# Patient Record
Sex: Female | Born: 1950
Health system: Southern US, Community
[De-identification: ages and names within clinical notes are randomized; demographics above are authoritative.]

## PROBLEM LIST (undated history)

## (undated) DIAGNOSIS — K219 Gastro-esophageal reflux disease without esophagitis: Secondary | ICD-10-CM

## (undated) DIAGNOSIS — S62102A Fracture of unspecified carpal bone, left wrist, initial encounter for closed fracture: Secondary | ICD-10-CM

## (undated) DIAGNOSIS — R351 Nocturia: Secondary | ICD-10-CM

## (undated) DIAGNOSIS — K589 Irritable bowel syndrome without diarrhea: Secondary | ICD-10-CM

## (undated) DIAGNOSIS — T7840XA Allergy, unspecified, initial encounter: Secondary | ICD-10-CM

## (undated) DIAGNOSIS — I10 Essential (primary) hypertension: Secondary | ICD-10-CM

## (undated) DIAGNOSIS — Z86010 Personal history of colonic polyps: Secondary | ICD-10-CM

## (undated) DIAGNOSIS — D369 Benign neoplasm, unspecified site: Secondary | ICD-10-CM

## (undated) HISTORY — PX: COLONOSCOPY: SHX174

## (undated) HISTORY — DX: Benign neoplasm, unspecified site: D36.9

## (undated) HISTORY — DX: Gastro-esophageal reflux disease without esophagitis: K21.9

## (undated) HISTORY — DX: Personal history of colonic polyps: Z86.010

## (undated) HISTORY — PX: ABDOMINAL HYSTERECTOMY: SHX81

## (undated) HISTORY — PX: TONSILLECTOMY: SUR1361

## (undated) HISTORY — PX: TUBAL LIGATION: SHX77

## (undated) HISTORY — DX: Allergy, unspecified, initial encounter: T78.40XA

## (undated) HISTORY — DX: Fracture of unspecified carpal bone, left wrist, initial encounter for closed fracture: S62.102A

---

## 1980-09-21 HISTORY — PX: CARPAL TUNNEL RELEASE: SHX101

## 1998-04-16 ENCOUNTER — Inpatient Hospital Stay (HOSPITAL_COMMUNITY): Admission: RE | Admit: 1998-04-16 | Discharge: 1998-04-21 | Payer: Self-pay | Admitting: Surgery

## 1998-08-19 ENCOUNTER — Emergency Department (HOSPITAL_COMMUNITY): Admission: EM | Admit: 1998-08-19 | Discharge: 1998-08-19 | Payer: Self-pay | Admitting: Emergency Medicine

## 1998-09-19 ENCOUNTER — Emergency Department (HOSPITAL_COMMUNITY): Admission: EM | Admit: 1998-09-19 | Discharge: 1998-09-19 | Payer: Self-pay | Admitting: Emergency Medicine

## 1998-12-25 ENCOUNTER — Other Ambulatory Visit: Admission: RE | Admit: 1998-12-25 | Discharge: 1998-12-25 | Payer: Self-pay | Admitting: Obstetrics and Gynecology

## 1999-12-27 ENCOUNTER — Emergency Department (HOSPITAL_COMMUNITY): Admission: EM | Admit: 1999-12-27 | Discharge: 1999-12-27 | Payer: Self-pay | Admitting: Emergency Medicine

## 1999-12-27 ENCOUNTER — Encounter: Payer: Self-pay | Admitting: Emergency Medicine

## 2000-04-01 ENCOUNTER — Encounter: Payer: Self-pay | Admitting: *Deleted

## 2000-04-01 ENCOUNTER — Encounter: Admission: RE | Admit: 2000-04-01 | Discharge: 2000-04-01 | Payer: Self-pay | Admitting: *Deleted

## 2001-03-08 ENCOUNTER — Encounter: Payer: Self-pay | Admitting: Obstetrics and Gynecology

## 2001-03-08 ENCOUNTER — Ambulatory Visit (HOSPITAL_COMMUNITY): Admission: RE | Admit: 2001-03-08 | Discharge: 2001-03-08 | Payer: Self-pay | Admitting: Obstetrics and Gynecology

## 2001-03-13 ENCOUNTER — Emergency Department (HOSPITAL_COMMUNITY): Admission: EM | Admit: 2001-03-13 | Discharge: 2001-03-13 | Payer: Self-pay | Admitting: Emergency Medicine

## 2001-03-13 ENCOUNTER — Encounter: Payer: Self-pay | Admitting: Emergency Medicine

## 2001-06-09 ENCOUNTER — Encounter: Payer: Self-pay | Admitting: Obstetrics and Gynecology

## 2001-06-09 ENCOUNTER — Encounter: Admission: RE | Admit: 2001-06-09 | Discharge: 2001-06-09 | Payer: Self-pay | Admitting: Obstetrics and Gynecology

## 2002-06-30 ENCOUNTER — Encounter: Admission: RE | Admit: 2002-06-30 | Discharge: 2002-06-30 | Payer: Self-pay | Admitting: Obstetrics and Gynecology

## 2002-06-30 ENCOUNTER — Encounter: Payer: Self-pay | Admitting: Obstetrics and Gynecology

## 2002-10-18 ENCOUNTER — Other Ambulatory Visit: Admission: RE | Admit: 2002-10-18 | Discharge: 2002-10-18 | Payer: Self-pay | Admitting: Obstetrics and Gynecology

## 2003-10-26 ENCOUNTER — Other Ambulatory Visit: Admission: RE | Admit: 2003-10-26 | Discharge: 2003-10-26 | Payer: Self-pay | Admitting: Obstetrics and Gynecology

## 2004-09-25 ENCOUNTER — Ambulatory Visit: Payer: Self-pay | Admitting: Internal Medicine

## 2004-10-02 ENCOUNTER — Ambulatory Visit: Payer: Self-pay | Admitting: Internal Medicine

## 2004-12-03 ENCOUNTER — Other Ambulatory Visit: Admission: RE | Admit: 2004-12-03 | Discharge: 2004-12-03 | Payer: Self-pay | Admitting: Obstetrics and Gynecology

## 2005-06-29 ENCOUNTER — Ambulatory Visit: Payer: Self-pay | Admitting: Internal Medicine

## 2005-11-23 ENCOUNTER — Ambulatory Visit: Payer: Self-pay | Admitting: Internal Medicine

## 2005-12-17 ENCOUNTER — Ambulatory Visit: Payer: Self-pay | Admitting: Internal Medicine

## 2007-05-06 ENCOUNTER — Encounter
Admission: RE | Admit: 2007-05-06 | Discharge: 2007-05-06 | Payer: Self-pay | Admitting: Physical Medicine and Rehabilitation

## 2007-07-06 ENCOUNTER — Encounter: Payer: Self-pay | Admitting: *Deleted

## 2007-07-06 DIAGNOSIS — M1990 Unspecified osteoarthritis, unspecified site: Secondary | ICD-10-CM | POA: Insufficient documentation

## 2007-07-06 DIAGNOSIS — I10 Essential (primary) hypertension: Secondary | ICD-10-CM | POA: Insufficient documentation

## 2007-07-06 DIAGNOSIS — K589 Irritable bowel syndrome without diarrhea: Secondary | ICD-10-CM | POA: Insufficient documentation

## 2007-07-06 DIAGNOSIS — Z9189 Other specified personal risk factors, not elsewhere classified: Secondary | ICD-10-CM | POA: Insufficient documentation

## 2007-07-06 DIAGNOSIS — M797 Fibromyalgia: Secondary | ICD-10-CM | POA: Insufficient documentation

## 2007-07-06 DIAGNOSIS — IMO0002 Reserved for concepts with insufficient information to code with codable children: Secondary | ICD-10-CM | POA: Insufficient documentation

## 2007-07-06 DIAGNOSIS — G44209 Tension-type headache, unspecified, not intractable: Secondary | ICD-10-CM | POA: Insufficient documentation

## 2007-08-02 ENCOUNTER — Ambulatory Visit: Payer: Self-pay | Admitting: Internal Medicine

## 2007-08-04 LAB — CONVERTED CEMR LAB
ALT: 18 units/L (ref 0–35)
AST: 17 units/L (ref 0–37)
Albumin: 4 g/dL (ref 3.5–5.2)
Alkaline Phosphatase: 75 units/L (ref 39–117)
BUN: 17 mg/dL (ref 6–23)
Basophils Absolute: 0.1 10*3/uL (ref 0.0–0.1)
Basophils Relative: 0.7 % (ref 0.0–1.0)
Bilirubin Urine: NEGATIVE
Bilirubin, Direct: 0.1 mg/dL (ref 0.0–0.3)
CO2: 29 meq/L (ref 19–32)
Calcium: 10 mg/dL (ref 8.4–10.5)
Chloride: 101 meq/L (ref 96–112)
Cholesterol: 274 mg/dL (ref 0–200)
Creatinine, Ser: 1.1 mg/dL (ref 0.4–1.2)
Direct LDL: 191.1 mg/dL
Eosinophils Absolute: 0.1 10*3/uL (ref 0.0–0.6)
Eosinophils Relative: 1.4 % (ref 0.0–5.0)
GFR calc Af Amer: 67 mL/min
GFR calc non Af Amer: 55 mL/min
Glucose, Bld: 105 mg/dL — ABNORMAL HIGH (ref 70–99)
HCT: 36.3 % (ref 36.0–46.0)
HDL: 63.4 mg/dL (ref >39.0)
Hemoglobin, Urine: NEGATIVE
Hemoglobin: 12.4 g/dL (ref 12.0–15.0)
Ketones, ur: NEGATIVE mg/dL
Leukocytes, UA: NEGATIVE
Lymphocytes Relative: 32.5 % (ref 12.0–46.0)
MCHC: 34.1 g/dL (ref 30.0–36.0)
MCV: 91.1 fL (ref 78.0–100.0)
Monocytes Absolute: 0.5 10*3/uL (ref 0.2–0.7)
Monocytes Relative: 5.5 % (ref 3.0–11.0)
Neutro Abs: 5.5 10*3/uL (ref 1.4–7.7)
Neutrophils Relative %: 59.9 % (ref 43.0–77.0)
Nitrite: NEGATIVE
Platelets: 307 10*3/uL (ref 150–400)
Potassium: 4.1 meq/L (ref 3.5–5.1)
RBC: 3.98 M/uL (ref 3.87–5.11)
RDW: 13 % (ref 11.5–14.6)
Sodium: 140 meq/L (ref 135–145)
Specific Gravity, Urine: >=1.03 (ref 1.000–1.03)
TSH: 1.45 microintl units/mL (ref 0.35–5.50)
Total Bilirubin: 0.6 mg/dL (ref 0.3–1.2)
Total CHOL/HDL Ratio: 4.3
Total Protein: 7.5 g/dL (ref 6.0–8.3)
Triglycerides: 171 mg/dL — ABNORMAL HIGH (ref 0–149)
Urine Glucose: NEGATIVE mg/dL
Urobilinogen, UA: 0.2 (ref 0.0–1.0)
VLDL: 34 mg/dL (ref 0–40)
WBC: 9.2 10*3/uL (ref 4.5–10.5)
pH: 5.5 (ref 5.0–8.0)

## 2007-08-10 ENCOUNTER — Ambulatory Visit: Payer: Self-pay | Admitting: Internal Medicine

## 2007-08-10 DIAGNOSIS — F329 Major depressive disorder, single episode, unspecified: Secondary | ICD-10-CM | POA: Insufficient documentation

## 2007-08-10 DIAGNOSIS — E785 Hyperlipidemia, unspecified: Secondary | ICD-10-CM | POA: Insufficient documentation

## 2007-09-05 ENCOUNTER — Ambulatory Visit: Payer: Self-pay | Admitting: Internal Medicine

## 2007-09-05 LAB — CONVERTED CEMR LAB
ALT: 37 units/L — ABNORMAL HIGH (ref 0–35)
AST: 23 units/L (ref 0–37)
Albumin: 4.1 g/dL (ref 3.5–5.2)
Cholesterol: 213 mg/dL (ref 0–200)
Direct LDL: 129.2 mg/dL
HDL: 60.6 mg/dL (ref >39.0)
Total Bilirubin: 0.5 mg/dL (ref 0.3–1.2)

## 2007-09-06 ENCOUNTER — Encounter: Payer: Self-pay | Admitting: Internal Medicine

## 2007-09-09 ENCOUNTER — Ambulatory Visit: Payer: Self-pay | Admitting: Internal Medicine

## 2007-11-15 ENCOUNTER — Telehealth: Payer: Self-pay | Admitting: Internal Medicine

## 2007-12-01 ENCOUNTER — Ambulatory Visit: Payer: Self-pay | Admitting: Internal Medicine

## 2007-12-22 ENCOUNTER — Telehealth: Payer: Self-pay | Admitting: Internal Medicine

## 2008-03-26 ENCOUNTER — Telehealth: Payer: Self-pay | Admitting: Internal Medicine

## 2008-07-02 ENCOUNTER — Telehealth: Payer: Self-pay | Admitting: Internal Medicine

## 2008-10-24 ENCOUNTER — Telehealth: Payer: Self-pay | Admitting: Internal Medicine

## 2008-10-25 ENCOUNTER — Telehealth: Payer: Self-pay | Admitting: Internal Medicine

## 2009-01-17 ENCOUNTER — Telehealth (INDEPENDENT_AMBULATORY_CARE_PROVIDER_SITE_OTHER): Payer: Self-pay | Admitting: *Deleted

## 2009-02-09 ENCOUNTER — Encounter: Payer: Self-pay | Admitting: Internal Medicine

## 2009-04-25 ENCOUNTER — Telehealth: Payer: Self-pay | Admitting: Internal Medicine

## 2009-06-05 ENCOUNTER — Ambulatory Visit: Payer: Self-pay | Admitting: Internal Medicine

## 2009-06-05 LAB — CONVERTED CEMR LAB
BUN: 19 mg/dL (ref 6–23)
CO2: 29 meq/L (ref 19–32)
Calcium: 9.7 mg/dL (ref 8.4–10.5)
Creatinine, Ser: 1.2 mg/dL (ref 0.4–1.2)

## 2009-08-23 ENCOUNTER — Telehealth: Payer: Self-pay | Admitting: Internal Medicine

## 2009-10-10 ENCOUNTER — Telehealth: Payer: Self-pay | Admitting: Internal Medicine

## 2010-07-28 ENCOUNTER — Telehealth: Payer: Self-pay | Admitting: Internal Medicine

## 2010-08-13 ENCOUNTER — Ambulatory Visit: Payer: Self-pay | Admitting: Internal Medicine

## 2010-08-13 LAB — CONVERTED CEMR LAB
ALT: 12 units/L (ref 0–35)
AST: 14 units/L (ref 0–37)
Alkaline Phosphatase: 99 units/L (ref 39–117)
BUN: 16 mg/dL (ref 6–23)
Bilirubin, Direct: 0.1 mg/dL (ref 0.0–0.3)
Calcium: 9.7 mg/dL (ref 8.4–10.5)
Cholesterol: 257 mg/dL — ABNORMAL HIGH (ref 0–200)
Creatinine, Ser: 0.98 mg/dL (ref 0.40–1.20)
Indirect Bilirubin: 0.2 mg/dL (ref 0.0–0.9)
Potassium: 4.7 meq/L (ref 3.5–5.3)
Total Protein: 7.5 g/dL (ref 6.0–8.3)
Triglycerides: 171 mg/dL — ABNORMAL HIGH (ref <150)
VLDL: 34 mg/dL (ref 0–40)

## 2010-10-21 NOTE — Progress Notes (Signed)
  Phone Note Refill Request Message from:  Pharmacy  Refills Requested: Medication #1:  TRAMADOL HCL 50 MG TABS 1 to 2 three times a day as needed pain. last ov 06/05/2009. Please Advise refills  Initial call taken by: Ami Bullins CMA,  July 28, 2010 2:38 PM  Follow-up for Phone Call        ok for refills x 5 Follow-up by: Jacques Navy MD,  July 28, 2010 2:46 PM    Prescriptions: TRAMADOL HCL 50 MG TABS (TRAMADOL HCL) 1 to 2 three times a day as needed pain  #180 Each x 5   Entered by:   Ami Bullins CMA   Authorized by:   Jacques Navy MD   Signed by:   Bill Salinas CMA on 07/28/2010   Method used:   Electronically to        [facility]. [phone]* (retail)       [address]       [place], [place]  [address]       Ph: [phone]       Fax: [phone]   RxID:   [id number]

## 2010-10-21 NOTE — Progress Notes (Signed)
Summary: TRAMADOL  Phone Note Call from Patient   Summary of Call: Pt stopped in the office requesting refill of tramadol. Per pharm pt filled rx 12/3, 12/19 and 1/04. Ok per dr to fill new rx.  Initial call taken by: Lamar Sprinkles, CMA,  October 10, 2009 3:26 PM    New/Updated Medications: TRAMADOL HCL 50 MG TABS (TRAMADOL HCL) 1 to 2 three times a day as needed pain Prescriptions: TRAMADOL HCL 50 MG TABS (TRAMADOL HCL) 1 to 2 three times a day as needed pain  #180 x 5   Entered by:   Lamar Sprinkles, CMA   Authorized by:   Jacques Navy MD   Signed by:   Lamar Sprinkles, CMA on 10/10/2009   Method used:   Electronically to        [facility]. [phone]* (retail)       [address]       [place], [place]  [address]       Ph: [phone]       Fax: [phone]   RxID:   [id number]

## 2010-10-21 NOTE — Assessment & Plan Note (Signed)
Summary: last ov 2010/needs tramadol/cd   Vital Signs:  Patient profile:   60 year old female Height:      67 inches Weight:      175 pounds BMI:     27.51 O2 Sat:      99 % on Room air Temp:     98.5 degrees F oral Pulse rate:   74 / minute BP sitting:   138 / 78  (left arm) Cuff size:   regular  Vitals Entered By: Bill Salinas CMA (August 13, 2010 11:00 AM)  O2 Flow:  Room air CC: last office visit was in 2010 pt needs refill on tramadol sent to [facility]/ ab   Primary Care Provider:  Jameah Rouser  CC:  last office visit was in 2010 pt needs refill on tramadol sent to [facility]/ ab.  History of Present Illness: Patient presents for routine follow-up. Her last vist was in September '10. In the interval she has not had any major medical illness or hospitalization, no surgery no injuries or broken bones.  She reports that she is having pain in the left hand and feels that she has diffuse body pain that is previously diagnosed as fibromyalgia by Dr. Annamarie Major.   She is having hair loss.  She needs to have all her medications renewed. she has not had any labwork for over a year.  She feels she is under a lot of stress: mother with dementia, financial strain with employement being limited.  She has not had a breast exam or mammogram. She is limited by not having any insurance.   Current Medications (verified): 1)  Multivitamins   Tabs (Multiple Vitamin) .... Take One Tablet Once Daily 2)  Glucosamine Complex   Tabs (Nutritional Supplements) .... Take One Tablet Once Daily 3)  Caltrate .... Take One Tablet Once Daily 4)  Dicyclomine Hcl 10 Mg  Caps (Dicyclomine Hcl) .... Three Times A Day As Needed 5)  Fish Oil 300 Mg  Caps (Omega-3 Fatty Acids) .... Two Times A Day 6)  Lisinopril-Hydrochlorothiazide 20-12.5 Mg  Tabs (Lisinopril-Hydrochlorothiazide) .... Once Daily 7)  Diltiazem Hcl 90 Mg  Tabs (Diltiazem Hcl) .... Two Times A Day 8)  Celexa 20 Mg  Tabs  (Citalopram Hydrobromide) .... Take 1 Tablet By Mouth Once A Day 9)  Lovastatin 40 Mg Tabs (Lovastatin) .Marland Kitchen.. 1 By Mouth Once Daily 10)  Ranitidine Hcl 150 Mg  Caps (Ranitidine Hcl) .... Take 1 Tablet By Mouth Two Times A Day 11)  Tramadol Hcl 50 Mg Tabs (Tramadol Hcl) .Marland Kitchen.. 1 To 2 Three Times A Day As Needed Pain  Allergies (verified): 1)  ! Codeine  Past History:  Past Medical History: Last updated: 07/06/2007 IRRITABLE BOWEL SYNDROME (ICD-564.1) INSOMNIA, HX OF (ICD-V15.89) OSTEOARTHRITIS (ICD-715.90) ANSERINE BURSITIS (ICD-726.61) TENSION HEADACHE (ICD-307.81) FIBROMYALGIA (ICD-729.1) HYPERTENSION (ICD-401.9)    Past Surgical History: Last updated: 07/06/2007 * LAPAROSCOIC SURGERY FOR ADHESIONS HYSTERECTOMY, HX OF (ICD-V45.77)     Family History: Last updated: 2009-06-12 father-deceased @ 57: HTN, DM mother- 2: OA - s/p THR, a. 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  Review of Systems  The patient denies anorexia, fever, weight loss, weight gain, vision loss, hoarseness, chest pain, dyspnea on exertion, peripheral edema, prolonged cough, headaches, abdominal pain, melena, hematochezia, incontinence, muscle weakness, suspicious skin  lesions, difficulty walking, unusual weight change, and abnormal bleeding.    Physical Exam  General:  Well-developed,well-nourished AA woman in no acute distress; alert,appropriate and cooperative throughout examination Head:  Normocephalic and atraumatic without obvious abnormalities. No apparent alopecia or balding. Eyes:  pupils equal, pupils round, and corneas and lenses clear.   Ears:  R ear normal and L ear normal.   Nose:  no external deformity and no external erythema.   Mouth:  fair  dentition, missing several teeth, no buccal lesions, posterior pharynx clear. Neck:  supple and full ROM.   Breasts:  deferred. Lungs:  Normal respiratory effort, chest expands symmetrically. Lungs are clear to auscultation, no crackles or wheezes. Heart:  Normal rate and regular rhythm. S1 and S2 normal without gallop, murmur, click, rub or other extra sounds. Abdomen:  soft, non-tender, and normal bowel sounds.   Genitalia:  deferred Msk:  no joint tenderness, no joint swelling, and no redness over joints.   Pulses:  2+ radial Extremities:  No clubbing, cyanosis, edema, or deformity noted with normal full range of motion of all joints.   Neurologic:  alert & oriented X3, cranial nerves II-XII intact, gait normal, and DTRs symmetrical and normal.   Skin:  turgor normal, color normal, and no suspicious lesions.   Cervical Nodes:  no anterior cervical adenopathy and no posterior cervical adenopathy.   Psych:  Oriented X3, memory intact for recent and remote, normally interactive, good eye contact, and moderately anxious.     Impression & Recommendations:  Problem # 1:  DYSLIPIDEMIA (ICD-272.4) for routine lab with recommendations to follow.  Her updated medication list for this problem includes:    Lovastatin 40 Mg Tabs (Lovastatin) .Marland Kitchen... 1 by mouth once daily  Orders: T- * Misc. Laboratory test [phone])  addendum - LDL 156 too high. Question if this is up due to missed doses of lovastatin.  Plan - continue present dose.  Problem # 2:  DEPRESSIVE DISORDER (ICD-311) seems to be a bit pressured in her speech and restless.  Plan - continue present medication  Her updated medication list for this problem includes:    Celexa 20 Mg Tabs (Citalopram hydrobromide) .Marland Kitchen... Take 1 tablet by mouth once a day  Problem # 3:  FIBROMYALGIA (ICD-729.1) Has on-going pain but it is manageable.  Plan  - continue tramadol as needed.  The following medications were removed from the medication  list:    Darvocet-n 100 100-650 Mg Tabs (Propoxyphene n-apap) .Marland Kitchen... 1 three times a day Her updated medication list for this problem includes:    Tramadol Hcl 50 Mg Tabs (Tramadol hcl) .Marland Kitchen... 1 to 2 three times a day as needed pain  Problem # 4:  HYPERTENSION (ICD-401.9)  Her updated medication list for this problem includes:    Lisinopril-hydrochlorothiazide 20-12.5 Mg Tabs (Lisinopril-hydrochlorothiazide) ..... Once daily    Diltiazem Hcl 90 Mg Tabs (Diltiazem hcl) .Marland Kitchen..Marland Kitchen Two times a day  Orders: T- * Misc. Laboratory test [id number])  BP today: 138/78 Prior BP: 144/82 (06/05/2009)  BP is acceptable. Labs are normal.  Plan - continue present medications.  Problem # 5:  Preventive Health Care (ICD-V70.0) Patient with no new medical problems and seems stable. Her limited exam is normal. Last mammogram was in '08. She is in need of both a breast exam and a mammogram. $$ is an issue in regard to the later. She is s/p hysterectomy with last vulvo-vaginal exam in '08 - due for follow-up exam in 2013. She has not  had colonoscopy - $$ is a limiting factor. Immunizations are not up to date.   She may benefit from contacting [employer] with the [facility] program  [phone] for assistance in covering her costs for colonoscopy and for mammography as well as being able to return here for regular care without much personal cost.  Complete Medication List: 1)  Multivitamins Tabs (Multiple vitamin) .... Take one tablet once daily 2)  Glucosamine Complex Tabs (Nutritional supplements) .... Take one tablet once daily 3)  Caltrate  .... Take one tablet once daily 4)  Dicyclomine Hcl 10 Mg Caps (Dicyclomine hcl) .... Three times a day as needed 5)  Fish Oil 300 Mg Caps (Omega-3 fatty acids) .... Two times a day 6)  Lisinopril-hydrochlorothiazide 20-12.5 Mg Tabs (Lisinopril-hydrochlorothiazide) .... Once daily 7)  Diltiazem Hcl 90 Mg Tabs (Diltiazem hcl) .... Two times a day 8)  Celexa 20 Mg Tabs (Citalopram  hydrobromide) .... Take 1 tablet by mouth once a day 9)  Lovastatin 40 Mg Tabs (Lovastatin) .Marland Kitchen.. 1 by mouth once daily 10)  Ranitidine Hcl 150 Mg Caps (Ranitidine hcl) .... Take 1 tablet by mouth two times a day 11)  Tramadol Hcl 50 Mg Tabs (Tramadol hcl) .Marland Kitchen.. 1 to 2 three times a day as needed pain  atient: Sheri [name] Note: All result statuses are Final unless otherwise noted.  Tests: (1) Basic Metabolic Panel (56213)   Sodium                    140 mEq/L                   135-145   Potassium                 4.7 mEq/L                   3.5-5.3   Chloride                  100 mEq/L                   96-112   CO2                       31 mEq/L                    19-32   Glucose                   92 mg/dL                    08-65   BUN                       16 mg/dL                    7-84   Creatinine                0.98 mg/dL                  0.40-1.20   Calcium                   9.7 mg/dL                   6.9-62.9  Tests: (2) Lipid Profile (52841)   Cholesterol          [H]  257 mg/dL                   1-610     ATP III Classification:           < 200        mg/dL        Desirable          200 - 239     mg/dL        Borderline High          >= 240        mg/dL        High         Triglyceride         [H]  171 mg/dL                   <960   HDL Cholesterol           69 mg/dL                    >45   Total Chol/HDL Ratio      3.7 Ratio  VLDL Cholesterol (Calc)                             34 mg/dL                    4-09  LDL Cholesterol (Calc)                        [H]  154 mg/dL                   8-11           Total Cholesterol/HDL Ratio:CHD Risk                            Coronary Heart Disease Risk Table                                            Men       Women              1/2 Average Risk              3.4        3.3                  Average Risk              5.0        4.4              2 X Average Risk              9.6        7.1              3 X Average Risk              23.4       11.0     Use the calculated Patient Ratio above and the CHD Risk table      to determine the patient's CHD Risk.     ATP III Classification (LDL):           < 100  mg/dL         Optimal          100 - 129     mg/dL         Near or Above Optimal          130 - 159     mg/dL         Borderline High          160 - 189     mg/dL         High           > 190        mg/dL         Very High        Tests: (3) Liver Profile (95621)   Bilirubin, Total          0.3 mg/dL                   3.0-8.6   Bilirubin, Direct         0.1 mg/dL                   5.7-8.4   Indirect Bilirubin        0.2 mg/dL                   6.9-6.2   Alkaline Phosphatase      99 U/L                      39-117   AST/SGOT                  14 U/L                      0-37   ALT/SGPT                  12 U/L                      0-35   Total Protein             7.5 g/dL                    9.5-2.8   Albumin                   4.7 g/dL                    [id number]: RANITIDINE HCL 150 MG  CAPS (RANITIDINE HCL) Take 1 tablet by mouth two times a day  #60 Each x 12   Entered and Authorized by:   Jacques Navy MD   Signed by:   Jacques Navy MD on 08/13/2010   Method used:   Print then Give to Patient   RxID:   [id number] LOVASTATIN 40 MG TABS (LOVASTATIN) 1 by mouth once daily  #30 x 12   Entered and Authorized by:   Jacques Navy MD   Signed by:   Jacques Navy MD on 08/13/2010   Method used:   Print then Give to Patient   RxID:   [id number] DILTIAZEM HCL 90 MG  TABS (DILTIAZEM HCL) two times a day  #60 Each x 12   Entered and Authorized by:   Jacques Navy MD   Signed by:   Jacques Navy MD on 08/13/2010   Method used:  Print then Give to Patient   RxID:   [id number] LISINOPRIL-HYDROCHLOROTHIAZIDE 20-12.5 MG  TABS (LISINOPRIL-HYDROCHLOROTHIAZIDE) once daily  #30 Each x 12   Entered and Authorized by:   Jacques Navy MD   Signed by:   Jacques Navy MD  on 08/13/2010   Method used:   Print then Give to Patient   RxID:   [id number] DICYCLOMINE HCL 10 MG  CAPS (DICYCLOMINE HCL) three times a day as needed  #90 Each x 3   Entered and Authorized by:   Jacques Navy MD   Signed by:   Jacques Navy MD on 08/13/2010   Method used:   Print then Give to Patient   RxID:   [id number]    Orders Added: 1)  T- * Misc. Laboratory test [99999] 2)  Est. Patient Level II [32355]

## 2011-01-08 ENCOUNTER — Other Ambulatory Visit: Payer: Self-pay | Admitting: Internal Medicine

## 2011-01-08 NOTE — Telephone Encounter (Signed)
Please Advise refill for Tramadol.

## 2011-01-08 NOTE — Telephone Encounter (Signed)
Ok for refills prn  Thanks

## 2011-05-20 ENCOUNTER — Other Ambulatory Visit: Payer: Self-pay | Admitting: Internal Medicine

## 2011-05-27 ENCOUNTER — Other Ambulatory Visit: Payer: Self-pay | Admitting: Internal Medicine

## 2011-05-27 NOTE — Telephone Encounter (Signed)
Please advise refill? 

## 2011-05-28 NOTE — Telephone Encounter (Signed)
Ok for refill? 

## 2011-06-01 NOTE — Telephone Encounter (Signed)
Ok for refill x 5 

## 2011-09-08 ENCOUNTER — Other Ambulatory Visit: Payer: Self-pay | Admitting: Internal Medicine

## 2011-10-08 ENCOUNTER — Other Ambulatory Visit: Payer: Self-pay | Admitting: Internal Medicine

## 2011-10-13 ENCOUNTER — Telehealth: Payer: Self-pay | Admitting: *Deleted

## 2011-10-13 NOTE — Telephone Encounter (Signed)
Refill request for Tramadol 50mg . #180 with zero refills. Last refilled on 9.5.2012. Pt has not been seen within a year. OK to refill?

## 2011-10-13 NOTE — Telephone Encounter (Signed)
Ok for refill x 5 

## 2011-10-14 ENCOUNTER — Ambulatory Visit: Payer: Self-pay | Admitting: Internal Medicine

## 2011-10-14 MED ORDER — TRAMADOL HCL 50 MG PO TABS: 50.0000 mg | ORAL_TABLET | Freq: Three times a day (TID) | ORAL | Status: DC | PRN

## 2011-10-14 NOTE — Telephone Encounter (Signed)
Done

## 2012-04-01 ENCOUNTER — Other Ambulatory Visit: Payer: Self-pay | Admitting: Internal Medicine

## 2012-09-12 ENCOUNTER — Other Ambulatory Visit: Payer: Self-pay | Admitting: Internal Medicine

## 2012-09-21 DIAGNOSIS — S62102A Fracture of unspecified carpal bone, left wrist, initial encounter for closed fracture: Secondary | ICD-10-CM

## 2012-09-21 HISTORY — DX: Fracture of unspecified carpal bone, left wrist, initial encounter for closed fracture: S62.102A

## 2012-09-26 ENCOUNTER — Other Ambulatory Visit: Payer: Self-pay | Admitting: Internal Medicine

## 2012-10-03 ENCOUNTER — Ambulatory Visit: Payer: Self-pay | Admitting: Internal Medicine

## 2012-10-03 ENCOUNTER — Telehealth: Payer: Self-pay | Admitting: Internal Medicine

## 2012-10-03 NOTE — Telephone Encounter (Signed)
BP 220/124 is a hypertensive EMERGENCY. If she cannot come to the office she must: 1: check her BP and call back results 2. Go to ED for treatment if she can't come here.  This should not wait until Thursday - to do so is against medical advice!

## 2012-10-03 NOTE — Telephone Encounter (Signed)
The patient stated she was unable to come in until Thursday.  I scheduled her for Thursday at 11:30am.   Thanks!

## 2012-10-04 NOTE — Telephone Encounter (Signed)
Thank you, Carolyn.

## 2012-10-04 NOTE — Telephone Encounter (Signed)
Called pt, no answer.  Left an urgent voice mail for pt to call back or seek emergent care.

## 2012-10-04 NOTE — Telephone Encounter (Signed)
Pt called back.  She doesn't want to come before Thurs. Because she does not have her co-pay and has to work.  I told her what Dr. Debby Bud said.  She still didn't want to come sooner.

## 2012-10-04 NOTE — Telephone Encounter (Signed)
Spoke with patient.  She has refused to come in today due to working two jobs.  She has been scheduled for tomorrow morning.

## 2012-10-05 ENCOUNTER — Ambulatory Visit (INDEPENDENT_AMBULATORY_CARE_PROVIDER_SITE_OTHER): Payer: BC Managed Care – PPO | Admitting: Internal Medicine

## 2012-10-05 ENCOUNTER — Encounter: Payer: Self-pay | Admitting: Internal Medicine

## 2012-10-05 VITALS — BP 198/90 | HR 66 | Temp 97.6°F | Resp 10 | Wt 176.0 lb

## 2012-10-05 DIAGNOSIS — R350 Frequency of micturition: Secondary | ICD-10-CM

## 2012-10-05 DIAGNOSIS — I10 Essential (primary) hypertension: Secondary | ICD-10-CM

## 2012-10-05 MED ORDER — FUROSEMIDE 20 MG PO TABS: 20.0000 mg | ORAL_TABLET | Freq: Two times a day (BID) | ORAL | Status: DC

## 2012-10-05 MED ORDER — DILTIAZEM HCL ER COATED BEADS 240 MG PO CP24: 240.0000 mg | ORAL_CAPSULE | Freq: Every day | ORAL | Status: DC

## 2012-10-05 MED ORDER — OXYBUTYNIN CHLORIDE 5 MG PO TABS: 5.0000 mg | ORAL_TABLET | Freq: Two times a day (BID) | ORAL | Status: DC

## 2012-10-05 NOTE — Progress Notes (Signed)
  Subjective:    Patient ID: Sheri [name], female    DOB: Oct 10, 1950, 62 y.o.   MRN: [id number]  HPI Mrs. Botz was at orthopedics office Monday and BP 220/124 but she was asymptomatic. She has many stressors: two jobs, mother with dementia, poor sleep. Current medication: Diltiazem 90 mg bid.   Past Medical History  Diagnosis Date  . Wrist fracture, left 1/14   No past surgical history on file. No family history on file. History   Social History  . Marital Status: Divorced    Spouse Name: N/A    Number of Children: N/A  . Years of Education: N/A   Occupational History  . Not on file.   Social History Main Topics  . Smoking status: Former Smoker    Types: Cigarettes  . Smokeless tobacco: Never Used  . Alcohol Use: No  . Drug Use: No  . Sexually Active: No   Other Topics Concern  . Not on file   Social History Narrative  . No narrative on file    Current Outpatient Prescriptions on File Prior to Visit  Medication Sig Dispense Refill  . dicyclomine (BENTYL) 10 MG capsule TAKE ONE CAPSULE BY MOUTH THREE TIMES DAILY AS NEEDED  90 capsule  2  . traMADol (ULTRAM) 50 MG tablet TAKE ONE TABLET BY MOUTH EVERY 8 HOURS AS NEEDED FOR PAIN  180 tablet  4  . diltiazem (CARTIA XT) 240 MG 24 hr capsule Take 1 capsule (240 mg total) by mouth daily.  30 capsule  11  . furosemide (LASIX) 20 MG tablet Take 1 tablet (20 mg total) by mouth 2 (two) times daily.  30 tablet  11      Review of Systems System review is negative for any constitutional, cardiac, pulmonary, GI or neuro symptoms or complaints other than as described in the HPI. She does have a fracture left wrist. She does c/o urinary frequency including nocturia 3-4. Fecal incontinence on rare occasions     Objective:   Physical Exam Filed Vitals:   10/05/12 1105  BP: 198/90  Pulse: 66  Temp: 97.6 F (36.4 C)  Resp: 10   BP Readings from Last 3 Encounters:  10/05/12 198/90  08/13/10 138/78  06/05/09 144/82    Gen'l - WNWD AA woman, NOT overweight HEENT - C&S clear, PERRLA, EOMI Cor - RRR Neuro - non focal.       Assessment & Plan:  Urinary frequency with getting up at night. Question of irritable bladder (OAB) Plan  Trial of oxybutynin 5 mg twice a day AM and bedtime.

## 2012-10-05 NOTE — Patient Instructions (Addendum)
Blood pressure - definitely too high. On exam no change to suggest any damage. Plan increase diltiazem to Diltia XT 240 mg once a day          Furosemide 20 mg once every AM          Monitor your blood pressure and call me (e-message via MyChart) with report if the Blood pressure is running higher than 150/90          F/u office visit and lab in one month.  Urinary frequency with getting up at night Plan  Trial of oxybutynin 5 mg twice a day AM and bedtime.

## 2012-10-05 NOTE — Assessment & Plan Note (Signed)
Blood pressure - definitely too high. On exam no change to suggest any damage. Plan increase diltiazem to Diltia XT 240 mg once a day          Furosemide 20 mg once every AM          Monitor your blood pressure and call me (e-message via MyChart) with report if the Blood pressure is running higher than 150/90          F/u office visit and lab in one month.

## 2012-10-06 ENCOUNTER — Ambulatory Visit
Admission: RE | Admit: 2012-10-06 | Discharge: 2012-10-06 | Disposition: A | Discharge status: Home / Self Care | Payer: BC Managed Care – PPO | Source: Ambulatory Visit | Attending: Orthopedic Surgery | Admitting: Orthopedic Surgery

## 2012-10-06 ENCOUNTER — Other Ambulatory Visit: Payer: Self-pay | Admitting: Orthopedic Surgery

## 2012-10-06 ENCOUNTER — Ambulatory Visit: Payer: Self-pay | Admitting: Internal Medicine

## 2012-10-06 DIAGNOSIS — M25532 Pain in left wrist: Secondary | ICD-10-CM

## 2012-11-07 ENCOUNTER — Ambulatory Visit (INDEPENDENT_AMBULATORY_CARE_PROVIDER_SITE_OTHER): Payer: BC Managed Care – PPO | Admitting: Internal Medicine

## 2012-11-07 ENCOUNTER — Encounter: Payer: Self-pay | Admitting: Internal Medicine

## 2012-11-07 VITALS — BP 158/98 | HR 72 | Temp 98.9°F | Resp 10 | Wt 174.4 lb

## 2012-11-07 DIAGNOSIS — R351 Nocturia: Secondary | ICD-10-CM

## 2012-11-07 DIAGNOSIS — I10 Essential (primary) hypertension: Secondary | ICD-10-CM

## 2012-11-07 MED ORDER — TAMSULOSIN HCL 0.4 MG PO CAPS: 0.4000 mg | ORAL_CAPSULE | Freq: Every day | ORAL | Status: DC

## 2012-11-07 NOTE — Progress Notes (Signed)
  Subjective:    Patient ID: Sheri [name], female    DOB: 03-Jun-1951, 62 y.o.   MRN: [id number]  HPI Ms. Nelon presents for follow up of hypertension and nocturia.  She reports that the diltiazem makes her drowsy. Furthermore her BP has been poorly controlled - better than it was but not at goal. She has been asymptomatic  She still has nocturia x4-6 despite use of ditropan.  PMH, FamHx and SocHx reviewed for any changes and relevance. Current Outpatient Prescriptions on File Prior to Visit  Medication Sig Dispense Refill  . dicyclomine (BENTYL) 10 MG capsule TAKE ONE CAPSULE BY MOUTH THREE TIMES DAILY AS NEEDED  90 capsule  2  . fish oil-omega-3 fatty acids 1000 MG capsule Take 2 g by mouth daily.      . furosemide (LASIX) 20 MG tablet Take 1 tablet (20 mg total) by mouth 2 (two) times daily.  30 tablet  11  . Multiple Vitamins-Minerals (MULTIVITAMIN WITH MINERALS) tablet Take 1 tablet by mouth daily.      . traMADol (ULTRAM) 50 MG tablet TAKE ONE TABLET BY MOUTH EVERY 8 HOURS AS NEEDED FOR PAIN  180 tablet  4   No current facility-administered medications on file prior to visit.      Review of Systems System review is negative for any constitutional, cardiac, pulmonary, GI or neuro symptoms or complaints other than as described in the HPI.     Objective:   Physical Exam Filed Vitals:   11/07/12 0927  BP: 158/98  Pulse: 72  Temp: 98.9 F (37.2 C)  Resp: 10   Gen'l- WNWD AA woman in no distress Cor - RRR Pulm, - normal respirations Neuro - A&O x 3, CN II-XII normal.         Assessment & Plan:

## 2012-11-07 NOTE — Assessment & Plan Note (Signed)
Continued  Nocturia. She does admit to a difficult time emptying her bladder.  Plan - trial of tamsulosin for possible urethral stricture.

## 2012-11-07 NOTE — Assessment & Plan Note (Signed)
Blood pressure is still too high 158/98.  Plan Continue furosemide 20 mg once a day  Stop diltiazem CD 240 mg  Try Benicar 20 mg once a day - this is a different class of blood pressure medication, an ARB. You should see how effective it is within 3-4 days by checking your blood pressure. If it does as good a job or better we can call in a generic drug in the same class. This will save money.

## 2012-11-07 NOTE — Patient Instructions (Addendum)
1. Blood pressure is still too high 158/98.  Plan Continue furosemide 20 mg once a day  Stop diltiazem CD 240 mg  Try Benicar 20 mg once a day - this is a different class of blood pressure medication, an ARB. You should see how effective it is within 3-4 days by checking your blood pressure. If it does as good a job or better we can call in a generic drug in the same class. This will save money.  2. Bladder function - it may be too much water or it may be poor bladder emptying.   Plan It seems that the oxybutynin is not working - you have to think too hard about whether it helps. Stop taking it.  We can try a drug called tamsulosin 0.4 mg to take at bedtime. This can relax the bladder stem and improve bladder emptying. Rx sent in.

## 2012-11-15 ENCOUNTER — Telehealth: Payer: Self-pay | Admitting: Internal Medicine

## 2012-11-15 NOTE — Telephone Encounter (Signed)
Pt was given samples of Benicar 20.  She only had enough for 7 days and has been out for 2 days.  She did not take her BP until today after being out for 2 days.  The BP  today was 188/66.  Should she get more samples and try it another week?

## 2012-11-15 NOTE — Telephone Encounter (Signed)
May have 2 or 3 weeks worth of samples of Benicar 20 mg. She needs to check her blood pressure while taking the medication!!

## 2012-11-16 ENCOUNTER — Telehealth: Payer: Self-pay | Admitting: *Deleted

## 2012-11-16 NOTE — Telephone Encounter (Signed)
LVM for pt that her Benicar samples will be up front for her to pick up.

## 2012-11-16 NOTE — Telephone Encounter (Signed)
Pt wants to come on Thurs to pick up the Benicar 20 samples.  She is aware to keep a list of BP readings.  Please put up the samples for her.

## 2012-12-08 ENCOUNTER — Other Ambulatory Visit: Payer: Self-pay | Admitting: Internal Medicine

## 2013-01-02 ENCOUNTER — Telehealth: Payer: Self-pay

## 2013-01-02 NOTE — Telephone Encounter (Signed)
Pt calls and left message stating she would like a prescription for Benicar 20 mg. I called and let her know I have a months worth of samples for her to pick up at the front desk and to call back with any question or concerns.

## 2013-01-05 ENCOUNTER — Telehealth: Payer: Self-pay

## 2013-01-05 NOTE — Telephone Encounter (Signed)
Pt was calling to make sure the samples of Benicar are still available for her to pick up at the front desk.

## 2013-02-03 ENCOUNTER — Telehealth: Payer: Self-pay

## 2013-02-03 NOTE — Telephone Encounter (Signed)
Pt call requesting refills on her bp med (Benicare 20 mg). I called pt back and left msg that samples are at the front desk for her to pick up.

## 2013-02-10 ENCOUNTER — Other Ambulatory Visit: Payer: Self-pay | Admitting: Orthopedic Surgery

## 2013-03-07 ENCOUNTER — Other Ambulatory Visit: Payer: Self-pay | Admitting: Orthopedic Surgery

## 2013-03-09 ENCOUNTER — Telehealth: Payer: Self-pay

## 2013-03-09 MED ORDER — OLMESARTAN MEDOXOMIL 20 MG PO TABS: 20.0000 mg | ORAL_TABLET | Freq: Every day | ORAL | Status: DC

## 2013-03-09 NOTE — Telephone Encounter (Signed)
Phone call from patient requesting samples of Benicar 20 mg.  I called patient back and let her know samples are at the front desk for her.

## 2013-03-13 ENCOUNTER — Encounter (HOSPITAL_BASED_OUTPATIENT_CLINIC_OR_DEPARTMENT_OTHER): Payer: Self-pay | Admitting: *Deleted

## 2013-03-13 NOTE — Progress Notes (Signed)
To come in for [id number]  

## 2013-03-15 ENCOUNTER — Encounter (HOSPITAL_BASED_OUTPATIENT_CLINIC_OR_DEPARTMENT_OTHER)
Admission: RE | Admit: 2013-03-15 | Discharge: 2013-03-15 | Disposition: A | Discharge status: Home / Self Care | Payer: BC Managed Care – PPO | Source: Ambulatory Visit | Attending: Orthopedic Surgery | Admitting: Orthopedic Surgery

## 2013-03-15 LAB — BASIC METABOLIC PANEL
Calcium: 9.1 mg/dL (ref 8.4–10.5)
GFR calc non Af Amer: 62 mL/min — ABNORMAL LOW (ref >90)
Glucose, Bld: 91 mg/dL (ref 70–99)
Sodium: 139 mEq/L (ref 135–145)

## 2013-03-15 NOTE — H&P (Addendum)
Sheri [name] is an 62 y.o. female.   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  She has had a history of discomfort in the radial aspect of her left wrist dating back one year. Her pain radiates up the arm. She also has stiffness of her hand and weakness of her hand.   She has been evaluated with plain films that suggested a possible scaphoid injury. She had a CT scan of her wrist that was interpreted to be normal.   Recently she saw her primary MD on 11-28-12 and noted evidence of deQuervain's stenosing tenosynovitis. She has had swelling across the dorsum of her wrist but this may be due to wearing the splint too tight. She is extraordinarily pain focused and is quite challenged to accurately describe her discomfort during our history.    Past Medical History  Diagnosis Date  . Wrist fracture, left 1/14  . Hypertension   . IBS (irritable bowel syndrome)   . Nocturia     Past Surgical History  Procedure Laterality Date  . Tubal ligation    . Abdominal hysterectomy    . Carpal tunnel release  1982    rt  . Tonsillectomy      History reviewed. No pertinent family history. Social History:  reports that she quit smoking about 10 years ago. Her smoking use included Cigarettes. She smoked 0.00 packs per day. She has never used smokeless tobacco. She reports that she does not drink alcohol or use illicit drugs.  Allergies:  Allergies  Allergen Reactions  . Codeine Nausea And Vomiting    No prescriptions prior to admission    Results for orders placed during the hospital encounter of 03/16/13 (from the past 48 hour(s))  BASIC METABOLIC PANEL     Status: Abnormal   Collection Time    03/15/13 12:00 PM      Result Value Range   Sodium 139  135 - 145 mEq/L   Potassium 4.0  3.5 - 5.1 mEq/L   Chloride 103  96 - 112 mEq/L   CO2 28  19 - 32 mEq/L   Glucose, Bld 91  70 - 99 mg/dL   BUN 19  6 -  23 mg/dL   Creatinine, Ser 0.10  0.50 - 1.10 mg/dL   Calcium 9.1  8.4 - 27.2 mg/dL   GFR calc non Af Amer 62 (*) >90 mL/min   GFR calc Af Amer 72 (*) >90 mL/min   Comment:            The eGFR has been calculated     using the CKD EPI equation.     This calculation has not been     validated in all clinical     situations.     eGFR's persistently     <90 mL/min signify     possible Chronic Kidney Disease.    No results found.   Pertinent items are noted in HPI.  Height 5\' 7"  (1.702 m), weight 72.576 kg (160 lb).  General appearance: alert Head: Normocephalic, without obvious abnormality Neck: supple, symmetrical, trachea midline Resp: clear to auscultation bilaterally Cardio: regular rate and rhythm GI: normal findings: bowel sounds normal Extremities:  She has swelling over her left first dorsal compartment and markedly positive Finkelstein's maneuver. She has generalized stiffness of her fingers that may be due to swelling from her splint or could be due to prolonged immobilization or  a consequence of carpal tunnel syndrome. She has positive provocative signs of carpal tunnel syndrome.  6 millimeter in diameter cyst overlying the A1 pulley left thumb.  Triggering of left thumb flexor pollicis longus at A1 pulley.  Plain x-rays of her wrist AP, lateral and an oblique demonstrate an irregularity in the scaphoid.  Pulses: 2+ and symmetric Skin: normal Neurologic: Grossly normal    Assessment/Plan Impression: Chronic DeQuervain's STS left wrist.  Painful cyst left thumb A1 pulley.   Plan: To the OR for release left 1st dorsal compartment and removal of painful cyst from left thumb A1 pulley..The procedure, risks,benefits and post-op course were discussed with the patient at length and they were in agreement with the plan.  DASNOIT,Loyce Flaming J 03/15/2013, 3:38 PM   H&P documentation: 03/16/2013  -History and Physical Reviewed  -Patient has been re-examined  -No change in  the plan of care  Wyn Forster, MD

## 2013-03-16 ENCOUNTER — Encounter (HOSPITAL_BASED_OUTPATIENT_CLINIC_OR_DEPARTMENT_OTHER): Payer: Self-pay | Admitting: *Deleted

## 2013-03-16 ENCOUNTER — Encounter (HOSPITAL_BASED_OUTPATIENT_CLINIC_OR_DEPARTMENT_OTHER): Payer: Self-pay | Admitting: Certified Registered Nurse Anesthetist

## 2013-03-16 ENCOUNTER — Ambulatory Visit (HOSPITAL_BASED_OUTPATIENT_CLINIC_OR_DEPARTMENT_OTHER)
Admission: RE | Admit: 2013-03-16 | Discharge: 2013-03-16 | Disposition: A | Discharge status: Home / Self Care | Payer: BC Managed Care – PPO | Source: Ambulatory Visit | Attending: Orthopedic Surgery | Admitting: Orthopedic Surgery

## 2013-03-16 ENCOUNTER — Encounter (HOSPITAL_BASED_OUTPATIENT_CLINIC_OR_DEPARTMENT_OTHER)
Admission: RE | Disposition: A | Discharge status: Home / Self Care | Payer: Self-pay | Source: Ambulatory Visit | Attending: Orthopedic Surgery

## 2013-03-16 ENCOUNTER — Ambulatory Visit (HOSPITAL_BASED_OUTPATIENT_CLINIC_OR_DEPARTMENT_OTHER): Payer: BC Managed Care – PPO | Admitting: Certified Registered Nurse Anesthetist

## 2013-03-16 DIAGNOSIS — M6740 Ganglion, unspecified site: Secondary | ICD-10-CM | POA: Insufficient documentation

## 2013-03-16 DIAGNOSIS — Z885 Allergy status to narcotic agent status: Secondary | ICD-10-CM | POA: Insufficient documentation

## 2013-03-16 DIAGNOSIS — M654 Radial styloid tenosynovitis [de Quervain]: Secondary | ICD-10-CM | POA: Insufficient documentation

## 2013-03-16 DIAGNOSIS — Z87891 Personal history of nicotine dependence: Secondary | ICD-10-CM | POA: Insufficient documentation

## 2013-03-16 DIAGNOSIS — R351 Nocturia: Secondary | ICD-10-CM | POA: Insufficient documentation

## 2013-03-16 DIAGNOSIS — K589 Irritable bowel syndrome without diarrhea: Secondary | ICD-10-CM | POA: Insufficient documentation

## 2013-03-16 DIAGNOSIS — I10 Essential (primary) hypertension: Secondary | ICD-10-CM | POA: Insufficient documentation

## 2013-03-16 HISTORY — DX: Essential (primary) hypertension: I10

## 2013-03-16 HISTORY — DX: Irritable bowel syndrome, unspecified: K58.9

## 2013-03-16 HISTORY — DX: Nocturia: R35.1

## 2013-03-16 HISTORY — PX: DORSAL COMPARTMENT RELEASE: SHX5039

## 2013-03-16 LAB — POCT HEMOGLOBIN-HEMACUE: Hemoglobin: 11.5 g/dL — ABNORMAL LOW (ref 12.0–15.0)

## 2013-03-16 SURGERY — RELEASE, FIRST DORSAL COMPARTMENT, HAND
Anesthesia: Monitor Anesthesia Care | Site: Hand | Laterality: Left | Wound class: Clean

## 2013-03-16 MED ORDER — LIDOCAINE HCL (CARDIAC) 20 MG/ML IV SOLN
INTRAVENOUS | Status: DC | PRN
  Administered 2013-03-16: 60 mg via INTRAVENOUS

## 2013-03-16 MED ORDER — MIDAZOLAM HCL 2 MG/2ML IJ SOLN: 1.0000 mg | INTRAMUSCULAR | Status: DC | PRN

## 2013-03-16 MED ORDER — LIDOCAINE HCL 2 % IJ SOLN
INTRAMUSCULAR | Status: DC | PRN
  Administered 2013-03-16: 3 mL

## 2013-03-16 MED ORDER — FENTANYL CITRATE 0.05 MG/ML IJ SOLN
INTRAMUSCULAR | Status: DC | PRN
  Administered 2013-03-16: 50 ug via INTRAVENOUS

## 2013-03-16 MED ORDER — LACTATED RINGERS IV SOLN
INTRAVENOUS | Status: DC
Start: 2013-03-16 — End: 2013-03-16
  Administered 2013-03-16 (×2): via INTRAVENOUS

## 2013-03-16 MED ORDER — FENTANYL CITRATE 0.05 MG/ML IJ SOLN
25.0000 ug | INTRAMUSCULAR | Status: DC | PRN
  Administered 2013-03-16: 25 ug via INTRAVENOUS
  Administered 2013-03-16: 50 ug via INTRAVENOUS
  Administered 2013-03-16: 25 ug via INTRAVENOUS

## 2013-03-16 MED ORDER — CHLORHEXIDINE GLUCONATE 4 % EX LIQD: 60.0000 mL | Freq: Once | CUTANEOUS | Status: DC

## 2013-03-16 MED ORDER — METOCLOPRAMIDE HCL 5 MG/ML IJ SOLN: 10.0000 mg | Freq: Once | INTRAMUSCULAR | Status: DC | PRN

## 2013-03-16 MED ORDER — OXYCODONE HCL 5 MG PO TABS
5.0000 mg | ORAL_TABLET | Freq: Once | ORAL | Status: AC | PRN
  Administered 2013-03-16: 5 mg via ORAL

## 2013-03-16 MED ORDER — ONDANSETRON HCL 4 MG/2ML IJ SOLN
INTRAMUSCULAR | Status: DC | PRN
  Administered 2013-03-16: 4 mg via INTRAVENOUS

## 2013-03-16 MED ORDER — OXYCODONE HCL 5 MG/5ML PO SOLN: 5.0000 mg | Freq: Once | ORAL | Status: AC | PRN

## 2013-03-16 MED ORDER — HYDROMORPHONE HCL 2 MG PO TABS: 2.0000 mg | ORAL_TABLET | ORAL | Status: DC | PRN

## 2013-03-16 MED ORDER — MIDAZOLAM HCL 5 MG/5ML IJ SOLN
INTRAMUSCULAR | Status: DC | PRN
  Administered 2013-03-16: 1 mg via INTRAVENOUS

## 2013-03-16 MED ORDER — PROPOFOL INFUSION 10 MG/ML OPTIME
INTRAVENOUS | Status: DC | PRN
  Administered 2013-03-16: 100 ug/kg/min via INTRAVENOUS

## 2013-03-16 MED ORDER — FENTANYL CITRATE 0.05 MG/ML IJ SOLN: 50.0000 ug | INTRAMUSCULAR | Status: DC | PRN

## 2013-03-16 SURGICAL SUPPLY — 40 items
BANDAGE ELASTIC 3 VELCRO ST LF (GAUZE/BANDAGES/DRESSINGS) ×1 IMPLANT
BLADE MINI RND TIP GREEN BEAV (BLADE) IMPLANT
BLADE SURG 15 STRL LF DISP TIS (BLADE) ×1 IMPLANT
BLADE SURG 15 STRL SS (BLADE) ×2
BNDG CMPR 9X4 STRL LF SNTH (GAUZE/BANDAGES/DRESSINGS) ×1
BNDG ESMARK 4X9 LF (GAUZE/BANDAGES/DRESSINGS) ×1 IMPLANT
BRUSH SCRUB EZ PLAIN DRY (MISCELLANEOUS) ×2 IMPLANT
CLOTH BEACON ORANGE TIMEOUT ST (SAFETY) ×2 IMPLANT
CORDS BIPOLAR (ELECTRODE) ×1 IMPLANT
COVER MAYO STAND STRL (DRAPES) ×2 IMPLANT
COVER TABLE BACK 60X90 (DRAPES) ×2 IMPLANT
CUFF TOURNIQUET SINGLE 18IN (TOURNIQUET CUFF) ×1 IMPLANT
DECANTER SPIKE VIAL GLASS SM (MISCELLANEOUS) IMPLANT
DRAPE EXTREMITY T 121X128X90 (DRAPE) ×2 IMPLANT
DRAPE SURG 17X23 STRL (DRAPES) ×2 IMPLANT
DRSG TEGADERM 4X4.75 (GAUZE/BANDAGES/DRESSINGS) IMPLANT
GLOVE BIO SURGEON STRL SZ 6.5 (GLOVE) ×2 IMPLANT
GLOVE BIOGEL M STRL SZ7.5 (GLOVE) ×1 IMPLANT
GLOVE BIOGEL PI IND STRL 7.0 (GLOVE) IMPLANT
GLOVE BIOGEL PI INDICATOR 7.0 (GLOVE) ×2
GLOVE ORTHO TXT STRL SZ7.5 (GLOVE) ×2 IMPLANT
GOWN BRE IMP PREV XXLGXLNG (GOWN DISPOSABLE) ×3 IMPLANT
GOWN PREVENTION PLUS XLARGE (GOWN DISPOSABLE) ×3 IMPLANT
NEEDLE 27GAX1X1/2 (NEEDLE) ×1 IMPLANT
PACK BASIN DAY SURGERY FS (CUSTOM PROCEDURE TRAY) ×2 IMPLANT
PAD CAST 3X4 CTTN HI CHSV (CAST SUPPLIES) IMPLANT
PADDING CAST ABS 4INX4YD NS (CAST SUPPLIES) ×1
PADDING CAST ABS COTTON 4X4 ST (CAST SUPPLIES) ×1 IMPLANT
PADDING CAST COTTON 3X4 STRL (CAST SUPPLIES)
SLEEVE SCD COMPRESS KNEE MED (MISCELLANEOUS) IMPLANT
SPONGE GAUZE 4X4 12PLY (GAUZE/BANDAGES/DRESSINGS) ×2 IMPLANT
STOCKINETTE 4X48 STRL (DRAPES) ×2 IMPLANT
STRIP CLOSURE SKIN 1/2X4 (GAUZE/BANDAGES/DRESSINGS) ×1 IMPLANT
SUT PROLENE 3 0 PS 2 (SUTURE) ×2 IMPLANT
SUT VIC AB 4-0 P-3 18XBRD (SUTURE) IMPLANT
SUT VIC AB 4-0 P3 18 (SUTURE)
SYR 3ML 23GX1 SAFETY (SYRINGE) IMPLANT
SYR CONTROL 10ML LL (SYRINGE) ×1 IMPLANT
TRAY DSU PREP LF (CUSTOM PROCEDURE TRAY) ×2 IMPLANT
UNDERPAD 30X30 INCONTINENT (UNDERPADS AND DIAPERS) ×2 IMPLANT

## 2013-03-16 NOTE — Anesthesia Procedure Notes (Signed)
Procedure Name: MAC Date/Time: 03/16/2013 10:30 AM Performed by: Verena Shawgo D Pre-anesthesia Checklist: Patient identified, Emergency Drugs available, Suction available, Patient being monitored and Timeout performed Patient Re-evaluated:Patient Re-evaluated prior to inductionOxygen Delivery Method: Simple face mask

## 2013-03-16 NOTE — Transfer of Care (Signed)
Immediate Anesthesia Transfer of Care Note  Patient: Sheri [name]  Procedure(s) Performed: Procedure(s): LEFT FIRST RELEASE DORSAL COMPARTMENT (DEQUERVAIN) EXCISION OF CYST  A1 PULLEY LEFT THUMB (Left)  Patient Location: PACU  Anesthesia Type:MAC  Level of Consciousness: awake, alert , oriented and patient cooperative  Airway & Oxygen Therapy: Patient Spontanous Breathing and Patient connected to face mask oxygen  Post-op Assessment: Report given to PACU RN and Post -op Vital signs reviewed and stable  Post vital signs: Reviewed and stable  Complications: No apparent anesthesia complications

## 2013-03-16 NOTE — Op Note (Signed)
[id number] 

## 2013-03-16 NOTE — Anesthesia Preprocedure Evaluation (Addendum)
Anesthesia Evaluation  Patient identified by MRN, date of birth, ID band Patient awake    Reviewed: Allergy & Precautions, H&P , NPO status , Patient's Chart, lab work & pertinent test results, reviewed documented beta blocker date and time   Airway Mallampati: II TM Distance: >3 FB Neck ROM: full    Dental   Pulmonary neg pulmonary ROS,  breath sounds clear to auscultation        Cardiovascular hypertension, Pt. on medications Rhythm:regular     Neuro/Psych  Headaches, PSYCHIATRIC DISORDERS Depression  Neuromuscular disease    GI/Hepatic negative GI ROS, Neg liver ROS,   Endo/Other  negative endocrine ROS  Renal/GU negative Renal ROS  negative genitourinary   Musculoskeletal   Abdominal   Peds  Hematology negative hematology ROS (+)   Anesthesia Other Findings See surgeon's H&P   Reproductive/Obstetrics negative OB ROS                           Anesthesia Physical Anesthesia Plan  ASA: II  Anesthesia Plan: MAC   Post-op Pain Management:    Induction: Intravenous  Airway Management Planned: Simple Face Mask  Additional Equipment:   Intra-op Plan:   Post-operative Plan:   Informed Consent: I have reviewed the patients History and Physical, chart, labs and discussed the procedure including the risks, benefits and alternatives for the proposed anesthesia with the patient or authorized representative who has indicated his/her understanding and acceptance.   Dental Advisory Given  Plan Discussed with: CRNA and Surgeon  Anesthesia Plan Comments:        Anesthesia Quick Evaluation

## 2013-03-16 NOTE — Brief Op Note (Signed)
03/16/2013  11:28 AM  PATIENT:  Sheri [name]  62 y.o. female  PRE-OPERATIVE DIAGNOSIS:  LEFT DEQUERVAINS/ MUCOID CYST LEFT THUMB A1 pulley  POST-OPERATIVE DIAGNOSIS:  LEFT DEQUERVAINS/ MUCOID CYST LEFT THUMB A1 pulley  PROCEDURE:  Procedure(s): LEFT FIRST RELEASE DORSAL COMPARTMENT (DEQUERVAIN) EXCISION OF CYST  A1 PULLEY LEFT THUMB (Left)  SURGEON:  Surgeon(s) and Role:    * Wyn Forster., MD - Primary  PHYSICIAN ASSISTANT:   ASSISTANTS: Surgical technician   ANESTHESIA:   MAC  EBL:  Total I/O In: 1000 [I.V.:1000] Out: -   BLOOD ADMINISTERED:none  DRAINS: none   LOCAL MEDICATIONS USED:  XYLOCAINE   SPECIMEN:  No Specimen  DISPOSITION OF SPECIMEN:  N/A  COUNTS:  YES  TOURNIQUET:   Total Tourniquet Time Documented: Upper Arm (Left) - 22 minutes Total: Upper Arm (Left) - 22 minutes   DICTATION: .Other Dictation: Dictation Number [id number]  PLAN OF CARE: Discharge to home after PACU  PATIENT DISPOSITION:  PACU - hemodynamically stable.   Delay start of Pharmacological VTE agent (>24hrs) due to surgical blood loss or risk of bleeding: not applicable

## 2013-03-16 NOTE — Anesthesia Postprocedure Evaluation (Signed)
Anesthesia Post Note  Patient: Sheri [name]  Procedure(s) Performed: Procedure(s) (LRB): LEFT FIRST RELEASE DORSAL COMPARTMENT (DEQUERVAIN) EXCISION OF CYST  A1 PULLEY LEFT THUMB (Left)  Anesthesia type: MAC  Patient location: PACU  Post pain: Pain level controlled  Post assessment: Patient's Cardiovascular Status Stable  Last Vitals:  Filed Vitals:   03/16/13 1245  BP: 153/55  Pulse: 50  Temp: 36.9 C  Resp: 16    Post vital signs: Reviewed and stable  Level of consciousness: alert  Complications: No apparent anesthesia complications

## 2013-03-17 ENCOUNTER — Encounter (HOSPITAL_BASED_OUTPATIENT_CLINIC_OR_DEPARTMENT_OTHER): Payer: Self-pay | Admitting: Orthopedic Surgery

## 2013-03-17 NOTE — Op Note (Signed)
[name], POSTER NO.:  [id number]  MEDICAL RECORD NO.:  [id number]  LOCATION:                               FACILITY:  [facility]  PHYSICIAN:  Sheri [name], M.D. DATE OF BIRTH:  [birth year]  DATE OF PROCEDURE:  03/16/2013 DATE OF DISCHARGE:  03/16/2013                              OPERATIVE REPORT   PREOPERATIVE DIAGNOSES: 1. Severe chronic painful left first dorsal compartment stenosing     tenosynovitis. 2. Uncomfortable cyst and chronic stenosing tenosynovitis, left thumb,     flexor pollicis longus at A1 pulley.  POSTOPERATIVE DIAGNOSES: 1. Severe chronic painful left first dorsal compartment stenosing     tenosynovitis. 2. Uncomfortable cyst and chronic stenosing tenosynovitis, left thumb,     flexor pollicis longus at A1 pulley.  OPERATION: 1. Release of left first dorsal compartment with resection of a 5 mm     wide septum and identification of a 4-5 mm thick retinacular wall. 2. Resection of myxoid cyst from left thumb A1 pulley and release of     A1 pulley.  OPERATING SURGEON:  Sheri [name]. Garnette Greb, MD  ASSISTANT:  Surgical technician.  ANESTHESIA:  Lidocaine 2%, first dorsal compartment block, and thumb metacarpal head level block supplemented by IV sedation.  SUPERVISING ANESTHESIOLOGIST:  Sheri [name]. Sheri Mink, MD  INDICATIONS:  Sheri [name] is a 62 year old woman referred through the courtesy of Dr. Teryl [name] for evaluation and management of severe stenosing synovitis of left first dorsal compartment and a mass with triggering of the left thumb.  She had reported bilateral hand numbness and had electrodiagnostic studies confirming right carpal tunnel syndrome, but not showing findings of entrapment neuropathy of the left median nerve at carpal tunnel level.  She has failed nonoperative measures including splinting and activity modification as well as steroid injection.  We recommended that she proceed with release of the left  first dorsal compartment and removal of her cyst from her left thumb A1 pulley, and release of the left thumb A1 pulley.  After informed consent, she is brought to the operating room at this time.  PROCEDURE:  Sheri [name] was brought to room 1 of the [facility] and placed in supine position on the operating table.  Following detailed anesthesia and informed consent by Dr. Gelene [name], IV sedation and local anesthesia was recommended and accepted.  In room 1 after Betadine prep, we infiltrated 2% lidocaine into the path of the intended incision on the radial aspect of the left wrist and forearm and in the region of the first dorsal compartment.  We also infiltrated 2% lidocaine into the flexor pollicis longus tendon sheath and around the metacarpal head region to obtain a digital block of the thumb.  The left hand and arm were then prepped with Betadine soap and solution, sterilely draped with sterile stockinette, and impervious arthroscopy drape.  IV sedation was provided.  Following routine surgical time-out, the left hand and arm were exsanguinated with Esmarch bandage and the arterial tourniquet inflated to 220 mmHg.  The procedure commenced with a transverse incision directly over the palpably thickened A1 pulley and cyst.  Subcutaneous tissues were carefully divided taking care to identify  the flexor sheath and the ganglion cyst that was deep to the ulnar neurovascular bundle. Four Ragnell retractors were carefully placed followed by use of rongeur to remove the cyst.  The A1 pulley was then split with scalpel and scissors.  Sheri [name] demonstrated full active range of motion of the thumb IP joint.  Attention was then directed to the radial aspect of the of the wrist overlying the radial styloid.  A short transverse incision was fashioned, revealing significant edema in the subcutaneous space.  A Freer elevator was used to clear the radial superficial sensory  branches off the first dorsal compartment followed by placement of 4 Ragnell retractors.  The first dorsal compartment was opaque and thickened remarkably.  This was split with scalpel and scissors revealing a wall thickness overlying the extensor pollicis brevis of 4 mm and over the abductor pollicis longus of 5 mm.  A 4-5 mm wide septum was noted between the 2 slips of the abductor pollicis longus and the extensor pollicis brevis.  This was carefully removed under direct vision with a rongeur.  The tendons were freed to the muscular tendinous junction proximally and to the level of the radial styloid distally.  Care was taken to avoid the position of the radial artery.  The wound was inspected for bleeding points followed by release of the tourniquet.  Both wounds were repaired with intradermal 3-0 Prolene suture followed by Steri-Strips.  Compressive dressing applied with sterile gauze and Coban.  There were no apparent complications.  For aftercare, Sheri [name] was provided prescription for Dilaudid 2 mg, 1 or 2 tablets p.o. q.4-6 hours p.r.n. pain, 20 tablets without refill.     Sheri [name] Sheri [name], M.D.     RVS/MEDQ  D:  03/16/2013  T:  03/17/2013  Job:  [id number]  cc:   Sheri Post, MD

## 2013-04-11 ENCOUNTER — Telehealth: Payer: Self-pay | Admitting: *Deleted

## 2013-04-11 NOTE — Telephone Encounter (Signed)
Pt called requesting samples of Benicar 20mg .  Samples at front desk.

## 2013-05-05 ENCOUNTER — Emergency Department: Payer: Self-pay | Admitting: Emergency Medicine

## 2013-05-26 ENCOUNTER — Telehealth: Payer: Self-pay | Admitting: *Deleted

## 2013-05-26 MED ORDER — TRAMADOL HCL 50 MG PO TABS: 50.0000 mg | ORAL_TABLET | Freq: Three times a day (TID) | ORAL | Status: DC | PRN

## 2013-05-26 NOTE — Telephone Encounter (Signed)
rx called into pharmacy, pt notified 

## 2013-05-26 NOTE — Telephone Encounter (Signed)
Last Rx Jan - #180 w/ 4 refills. OK for refill. Will need OV soon - controlled substance contract, etc.

## 2013-05-26 NOTE — Telephone Encounter (Signed)
Pt called requesting Tramadol refill.  Please advise 

## 2013-07-24 ENCOUNTER — Other Ambulatory Visit: Payer: Self-pay

## 2013-07-24 MED ORDER — OLMESARTAN MEDOXOMIL 20 MG PO TABS: 20.0000 mg | ORAL_TABLET | Freq: Every day | ORAL | Status: DC

## 2013-07-24 MED ORDER — TRAMADOL HCL 50 MG PO TABS: 50.0000 mg | ORAL_TABLET | Freq: Three times a day (TID) | ORAL | Status: DC | PRN

## 2013-07-24 NOTE — Telephone Encounter (Signed)
Phone call from patient Sheri [name] at 11:57 am) requesting a refill on her Tramadol be sent to [facility] on [address]. She has an appt on Thurs to see you. Please advise.

## 2013-07-27 ENCOUNTER — Ambulatory Visit (INDEPENDENT_AMBULATORY_CARE_PROVIDER_SITE_OTHER): Payer: BC Managed Care – PPO | Admitting: Internal Medicine

## 2013-07-27 ENCOUNTER — Other Ambulatory Visit (INDEPENDENT_AMBULATORY_CARE_PROVIDER_SITE_OTHER): Payer: BC Managed Care – PPO

## 2013-07-27 ENCOUNTER — Encounter: Payer: Self-pay | Admitting: Internal Medicine

## 2013-07-27 VITALS — BP 200/100 | HR 64 | Temp 98.4°F | Wt 161.0 lb

## 2013-07-27 DIAGNOSIS — E785 Hyperlipidemia, unspecified: Secondary | ICD-10-CM

## 2013-07-27 DIAGNOSIS — I10 Essential (primary) hypertension: Secondary | ICD-10-CM

## 2013-07-27 DIAGNOSIS — R351 Nocturia: Secondary | ICD-10-CM

## 2013-07-27 LAB — COMPREHENSIVE METABOLIC PANEL
ALT: 16 U/L (ref 0–35)
AST: 17 U/L (ref 0–37)
Albumin: 4.1 g/dL (ref 3.5–5.2)
Alkaline Phosphatase: 61 U/L (ref 39–117)
Potassium: 4.2 mEq/L (ref 3.5–5.1)
Sodium: 140 mEq/L (ref 135–145)
Total Protein: 7.1 g/dL (ref 6.0–8.3)

## 2013-07-27 LAB — LDL CHOLESTEROL, DIRECT: Direct LDL: 117.5 mg/dL

## 2013-07-27 LAB — LIPID PANEL
Total CHOL/HDL Ratio: 2
VLDL: 10.2 mg/dL (ref 0.0–40.0)

## 2013-07-27 MED ORDER — FUROSEMIDE 20 MG PO TABS: 20.0000 mg | ORAL_TABLET | Freq: Two times a day (BID) | ORAL | Status: DC

## 2013-07-27 MED ORDER — TAMSULOSIN HCL 0.4 MG PO CAPS: 0.4000 mg | ORAL_CAPSULE | Freq: Every evening | ORAL | Status: DC

## 2013-07-27 NOTE — Progress Notes (Signed)
Pre visit review using our clinic review tool, if applicable. No additional management support is needed unless otherwise documented below in the visit note. 

## 2013-07-27 NOTE — Patient Instructions (Signed)
1. Blood pressure - too high. Many people will need to be on 3 or more medications. Plan Continue the Benicar  Resume the furosemide  Return for blood pressure check in 10-14 days.  2. Urinating at night - you did well on tamsulosin used for possible uretheral (bladder stem) stricture Plan Resume tamsulosin.

## 2013-07-28 ENCOUNTER — Encounter: Payer: Self-pay | Admitting: Internal Medicine

## 2013-07-29 NOTE — Assessment & Plan Note (Signed)
Urinating at night - you did well on tamsulosin used for possible uretheral (bladder stem) stricture  Plan Resume tamsulosin.

## 2013-07-29 NOTE — Assessment & Plan Note (Signed)
Blood pressure - too high. Many people will need to be on 3 or more medications. Plan Continue the Benicar  Resume the furosemide  Return for blood pressure check in 10-14 days.

## 2013-07-29 NOTE — Progress Notes (Signed)
HPI Sheri [name] was last seen in Feb '14 for BP. She was started on Benicar and was to continue Furosemide. She has stopped furosemide and reports that the benicar has caused a loss of appetite and weight loss.   In the interval she had surgery in June '14 by Dr. Teressa Senter for tenosynovitis left wrist.   She c/o pain in the low spine and posterior thighs. She was previously told she had sciatic in 2008. No imaging of lumbar spine in [facility].   Past Medical History  Diagnosis Date  . Wrist fracture, left 1/14  . Hypertension   . IBS (irritable bowel syndrome)   . Nocturia    Past Surgical History  Procedure Laterality Date  . Tubal ligation    . Abdominal hysterectomy    . Carpal tunnel release  1982    rt  . Tonsillectomy    . Dorsal compartment release Left 03/16/2013    Procedure: LEFT FIRST RELEASE DORSAL COMPARTMENT (DEQUERVAIN) EXCISION OF CYST  A1 PULLEY LEFT THUMB;  Surgeon: Sheri Forster., MD;  Location: [facility];  Service: Orthopedics;  Laterality: Left;   History reviewed. No pertinent family history. History   Social History  . Marital Status: Divorced    Spouse Name: N/A    Number of Children: N/A  . Years of Education: N/A   Occupational History  . Not on file.   Social History Main Topics  . Smoking status: Former Smoker    Types: Cigarettes    Quit date: 10/13/2002  . Smokeless tobacco: Never Used  . Alcohol Use: No  . Drug Use: No  . Sexual Activity: No   Other Topics Concern  . Not on file   Social History Narrative  . No narrative on file       Review of Systems System review is negative for any constitutional, cardiac, pulmonary, GI or neuro symptoms or complaints other than as described in the HPI.     Objective:   Physical Exam Filed Vitals:   07/27/13 1101  BP: 200/100  Pulse: 64  Temp: 98.4 F (36.9 C)   Gen'l- WNWD woman HEENT- normal Cor- 2+ radial RRR Pulm - normal Neuro - non-focal. Normal get up and go.  Normal gait.

## 2013-08-10 ENCOUNTER — Encounter: Payer: Self-pay | Admitting: Internal Medicine

## 2013-08-10 ENCOUNTER — Ambulatory Visit (INDEPENDENT_AMBULATORY_CARE_PROVIDER_SITE_OTHER): Payer: BC Managed Care – PPO | Admitting: Internal Medicine

## 2013-08-10 VITALS — BP 174/90 | HR 63 | Temp 99.3°F | Wt 159.8 lb

## 2013-08-10 DIAGNOSIS — Z1211 Encounter for screening for malignant neoplasm of colon: Secondary | ICD-10-CM

## 2013-08-10 DIAGNOSIS — I10 Essential (primary) hypertension: Secondary | ICD-10-CM

## 2013-08-10 MED ORDER — FUROSEMIDE 40 MG PO TABS: 40.0000 mg | ORAL_TABLET | Freq: Every day | ORAL | Status: DC

## 2013-08-10 NOTE — Progress Notes (Signed)
Pre visit review using our clinic review tool, if applicable. No additional management support is needed unless otherwise documented below in the visit note. 

## 2013-08-10 NOTE — Patient Instructions (Addendum)
Blood pressure - still not controlled.  Plan Will continue furosemide (lasix) but will change to 40 mg once a day (take 2 x 20mg  until gone) and the next prescription will be once a day.  Will increase Benicar to 40 mg once a day (take 2 x 20 mg until gone then Rx or samples for 40 mg tablet)  Monitor you blood pressure once or twice a day, keep a record and send me the information using MyChart

## 2013-08-11 NOTE — Progress Notes (Signed)
  Subjective:    Patient ID: Sheri [name], female    DOB: 1951-03-11, 62 y.o.   MRN: [id number]  HPI Ms. Michelsen presents for follow up of blood pressure. The last addition was Benicar 20 mg. She has tolerated BP meds but her pressure isnot controlled.  PMH, FamHx and SocHx reviewed for any changes and relevance.  Current Outpatient Prescriptions on File Prior to Visit  Medication Sig Dispense Refill  . aspirin 81 MG tablet Take 81 mg by mouth daily.      Marland Kitchen dicyclomine (BENTYL) 10 MG capsule TAKE ONE CAPSULE BY MOUTH THREE TIMES DAILY AS NEEDED( PT NEEDS TO SCHEUDLE PHYSICAL EXAM FOR FUTHER REFILLS)  90 capsule  2  . fish oil-omega-3 fatty acids 1000 MG capsule Take 2 g by mouth daily.      Marland Kitchen HYDROmorphone (DILAUDID) 2 MG tablet Take 1 tablet (2 mg total) by mouth every 4 (four) hours as needed for pain.  30 tablet  0  . Multiple Vitamins-Minerals (MULTIVITAMIN WITH MINERALS) tablet Take 1 tablet by mouth daily.      Marland Kitchen olmesartan (BENICAR) 20 MG tablet Take 1 tablet (20 mg total) by mouth daily.      . tamsulosin (FLOMAX) 0.4 MG CAPS capsule Take 1 capsule (0.4 mg total) by mouth every evening.  30 capsule  11  . traMADol (ULTRAM) 50 MG tablet Take 1 tablet (50 mg total) by mouth every 8 (eight) hours as needed for pain.  180 tablet  0   No current facility-administered medications on file prior to visit.   .    Review of Systems System review is negative for any constitutional, cardiac, pulmonary, GI or neuro symptoms or complaints other than as described in the HPI.     Objective:   Physical Exam Filed Vitals:   08/10/13 0928  BP: 174/90  Pulse: 63  Temp: 99.3 F (37.4 C)   Gen'l - WNWD woman in no distress HEENT- fundi normal Cor- RRR Neuro - A&O x 3      Assessment & Plan:

## 2013-08-11 NOTE — Assessment & Plan Note (Signed)
Blood pressure - still not controlled.  Plan Will continue furosemide (lasix) but will change to 40 mg once a day (take 2 x 20mg until gone) and the next prescription will be once a day.  Will increase Benicar to 40 mg once a day (take 2 x 20 mg until gone then Rx or samples for 40 mg tablet)  Monitor you blood pressure once or twice a day, keep a record and send me the information using MyChart    

## 2013-08-24 ENCOUNTER — Other Ambulatory Visit: Payer: Self-pay | Admitting: Internal Medicine

## 2013-09-04 ENCOUNTER — Telehealth: Payer: Self-pay | Admitting: Internal Medicine

## 2013-09-04 NOTE — Telephone Encounter (Signed)
Left message for patient that samples are at the front desk. There are 6 boxes of Benicar 20 mg (did not have a bag)

## 2013-09-04 NOTE — Telephone Encounter (Signed)
Pt req sample for Benica. Please call pt

## 2013-09-04 NOTE — Telephone Encounter (Signed)
Actually samples are in a bag with her name on it

## 2013-09-05 ENCOUNTER — Other Ambulatory Visit: Payer: Self-pay | Admitting: Orthopedic Surgery

## 2013-09-07 ENCOUNTER — Encounter (HOSPITAL_BASED_OUTPATIENT_CLINIC_OR_DEPARTMENT_OTHER): Payer: Self-pay | Admitting: *Deleted

## 2013-09-07 NOTE — Progress Notes (Signed)
To come in for bmet-ekg done 6/14 when she had surgery here

## 2013-09-11 ENCOUNTER — Encounter (HOSPITAL_BASED_OUTPATIENT_CLINIC_OR_DEPARTMENT_OTHER)
Admission: RE | Admit: 2013-09-11 | Discharge: 2013-09-11 | Disposition: A | Discharge status: Home / Self Care | Payer: BC Managed Care – PPO | Source: Ambulatory Visit | Attending: Orthopedic Surgery | Admitting: Orthopedic Surgery

## 2013-09-11 LAB — BASIC METABOLIC PANEL
BUN: 16 mg/dL (ref 6–23)
Calcium: 9.6 mg/dL (ref 8.4–10.5)
GFR calc Af Amer: 68 mL/min — ABNORMAL LOW (ref >90)
GFR calc non Af Amer: 58 mL/min — ABNORMAL LOW (ref >90)
Potassium: 4.2 mEq/L (ref 3.5–5.1)
Sodium: 139 mEq/L (ref 135–145)

## 2013-09-11 NOTE — H&P (Signed)
Sheri [name] is an 62 y.o. female.   Chief Complaint: c/o chronic and progressive numbness and tingling of the right hand HPI: . 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 Her pain radiates up the arm. She also has stiffness of her hand and weakness of her hand.    Past Medical History  Diagnosis Date  . Wrist fracture, left 1/14  . Hypertension   . IBS (irritable bowel syndrome)   . Nocturia     Past Surgical History  Procedure Laterality Date  . Tubal ligation    . Abdominal hysterectomy    . Carpal tunnel release  1982    rt  . Tonsillectomy    . Dorsal compartment release Left 03/16/2013    Procedure: LEFT FIRST RELEASE DORSAL COMPARTMENT (DEQUERVAIN) EXCISION OF CYST  A1 PULLEY LEFT THUMB;  Surgeon: Wyn Forster., MD;  Location: [facility];  Service: Orthopedics;  Laterality: Left;    History reviewed. No pertinent family history. Social History:  reports that she quit smoking about 10 years ago. Her smoking use included Cigarettes. She smoked 0.00 packs per day. She has never used smokeless tobacco. She reports that she does not drink alcohol or use illicit drugs.  Allergies:  Allergies  Allergen Reactions  . Codeine Nausea And Vomiting    No prescriptions prior to admission    Results for orders placed during the hospital encounter of 09/12/13 (from the past 48 hour(s))  BASIC METABOLIC PANEL     Status: Abnormal   Collection Time    09/11/13  9:00 AM      Result Value Range   Sodium 139  135 - 145 mEq/L   Potassium 4.2  3.5 - 5.1 mEq/L   Chloride 102  96 - 112 mEq/L   CO2 31  19 - 32 mEq/L   Glucose, Bld 93  70 - 99 mg/dL   BUN 16  6 - 23 mg/dL   Creatinine, Ser 1.61  0.50 - 1.10 mg/dL   Calcium 9.6  8.4 - 09.6 mg/dL   GFR calc non Af Amer 58 (*) >90 mL/min   GFR calc Af Amer 68 (*) >90 mL/min   Comment: (NOTE)   The eGFR has been calculated using the CKD EPI equation.     This calculation has not been validated in all clinical situations.     eGFR's persistently <90 mL/min signify possible Chronic Kidney     Disease.    No results found.   Pertinent items are noted in HPI.  Height 5\' 7"  (1.702 m), weight 71.668 kg (158 lb).  General appearance: alert Head: Normocephalic, without obvious abnormality Neck: supple, symmetrical, trachea midline Resp: clear to auscultation bilaterally Cardio: regular rate and rhythm GI: normal findings: bowel sounds normal Extremities: She has generalized stiffness of her fingers that may be due to swelling from her splint or could be due to prolonged immobilization or a consequence of carpal tunnel syndrome. She has positive provocative signs of carpal tunnel syndrome.  Pulses: 2+ and symmetric Skin: normal Neurologic: Grossly normal   Assessment/Plan Impression: Right CTS  Plan: To the OR for right CTR.The procedure, risks,benefits and post-op course were discussed with the patient at length and they were in agreement with the plan.  DASNOIT,Excell Neyland J 09/11/2013, 2:26 PM   H&P documentation: 09/12/2013  -History and Physical Reviewed  -  Patient has been re-examined  -No change in the plan of care  Cammie Sickle, MD

## 2013-09-12 ENCOUNTER — Encounter (HOSPITAL_BASED_OUTPATIENT_CLINIC_OR_DEPARTMENT_OTHER)
Admission: RE | Disposition: A | Discharge status: Home / Self Care | Payer: Self-pay | Source: Ambulatory Visit | Attending: Orthopedic Surgery

## 2013-09-12 ENCOUNTER — Ambulatory Visit (HOSPITAL_BASED_OUTPATIENT_CLINIC_OR_DEPARTMENT_OTHER)
Admission: RE | Admit: 2013-09-12 | Discharge: 2013-09-12 | Disposition: A | Discharge status: Home / Self Care | Payer: BC Managed Care – PPO | Source: Ambulatory Visit | Attending: Orthopedic Surgery | Admitting: Orthopedic Surgery

## 2013-09-12 ENCOUNTER — Ambulatory Visit (HOSPITAL_BASED_OUTPATIENT_CLINIC_OR_DEPARTMENT_OTHER): Payer: BC Managed Care – PPO | Admitting: Anesthesiology

## 2013-09-12 ENCOUNTER — Encounter (HOSPITAL_BASED_OUTPATIENT_CLINIC_OR_DEPARTMENT_OTHER): Payer: Self-pay

## 2013-09-12 ENCOUNTER — Encounter (HOSPITAL_BASED_OUTPATIENT_CLINIC_OR_DEPARTMENT_OTHER): Payer: BC Managed Care – PPO | Admitting: Anesthesiology

## 2013-09-12 DIAGNOSIS — I10 Essential (primary) hypertension: Secondary | ICD-10-CM | POA: Insufficient documentation

## 2013-09-12 DIAGNOSIS — Z01812 Encounter for preprocedural laboratory examination: Secondary | ICD-10-CM | POA: Insufficient documentation

## 2013-09-12 DIAGNOSIS — Z87891 Personal history of nicotine dependence: Secondary | ICD-10-CM | POA: Insufficient documentation

## 2013-09-12 DIAGNOSIS — G5600 Carpal tunnel syndrome, unspecified upper limb: Secondary | ICD-10-CM | POA: Insufficient documentation

## 2013-09-12 HISTORY — PX: CARPAL TUNNEL RELEASE: SHX101

## 2013-09-12 LAB — POCT HEMOGLOBIN-HEMACUE: Hemoglobin: 12.3 g/dL (ref 12.0–15.0)

## 2013-09-12 SURGERY — CARPAL TUNNEL RELEASE
Anesthesia: General | Site: Wrist | Laterality: Right

## 2013-09-12 MED ORDER — MIDAZOLAM HCL 5 MG/5ML IJ SOLN
INTRAMUSCULAR | Status: DC | PRN
  Administered 2013-09-12: 2 mg via INTRAVENOUS

## 2013-09-12 MED ORDER — LIDOCAINE HCL 2 % IJ SOLN
INTRAMUSCULAR | Status: DC | PRN
  Administered 2013-09-12: 3 mL

## 2013-09-12 MED ORDER — PROPOFOL 10 MG/ML IV BOLUS
INTRAVENOUS | Status: DC | PRN
  Administered 2013-09-12: 160 mg via INTRAVENOUS

## 2013-09-12 MED ORDER — MIDAZOLAM HCL 2 MG/2ML IJ SOLN: 1.0000 mg | INTRAMUSCULAR | Status: DC | PRN

## 2013-09-12 MED ORDER — MIDAZOLAM HCL 2 MG/2ML IJ SOLN
INTRAMUSCULAR | Status: AC
  Filled 2013-09-12: qty 2

## 2013-09-12 MED ORDER — HYDROMORPHONE HCL 2 MG PO TABS: 2.0000 mg | ORAL_TABLET | ORAL | Status: DC | PRN

## 2013-09-12 MED ORDER — MEPERIDINE HCL 25 MG/ML IJ SOLN: 6.2500 mg | INTRAMUSCULAR | Status: DC | PRN

## 2013-09-12 MED ORDER — ONDANSETRON HCL 4 MG/2ML IJ SOLN
INTRAMUSCULAR | Status: DC | PRN
  Administered 2013-09-12: 4 mg via INTRAVENOUS

## 2013-09-12 MED ORDER — OXYCODONE HCL 5 MG PO TABS
5.0000 mg | ORAL_TABLET | Freq: Once | ORAL | Status: AC | PRN
  Administered 2013-09-12: 5 mg via ORAL

## 2013-09-12 MED ORDER — FENTANYL CITRATE 0.05 MG/ML IJ SOLN
INTRAMUSCULAR | Status: DC | PRN
  Administered 2013-09-12: 100 ug via INTRAVENOUS

## 2013-09-12 MED ORDER — FENTANYL CITRATE 0.05 MG/ML IJ SOLN
50.0000 ug | INTRAMUSCULAR | Status: DC | PRN
  Administered 2013-09-12: 50 ug via INTRAVENOUS

## 2013-09-12 MED ORDER — HYDROMORPHONE HCL PF 1 MG/ML IJ SOLN
INTRAMUSCULAR | Status: AC
  Filled 2013-09-12: qty 1

## 2013-09-12 MED ORDER — OXYCODONE HCL 5 MG PO TABS
ORAL_TABLET | ORAL | Status: AC
  Filled 2013-09-12: qty 1

## 2013-09-12 MED ORDER — CHLORHEXIDINE GLUCONATE 4 % EX LIQD: 60.0000 mL | Freq: Once | CUTANEOUS | Status: DC

## 2013-09-12 MED ORDER — ONDANSETRON HCL 4 MG/2ML IJ SOLN: 4.0000 mg | Freq: Once | INTRAMUSCULAR | Status: DC | PRN

## 2013-09-12 MED ORDER — DEXAMETHASONE SODIUM PHOSPHATE 10 MG/ML IJ SOLN
INTRAMUSCULAR | Status: DC | PRN
  Administered 2013-09-12: 10 mg via INTRAVENOUS

## 2013-09-12 MED ORDER — HYDROMORPHONE HCL PF 1 MG/ML IJ SOLN
0.2500 mg | INTRAMUSCULAR | Status: DC | PRN
  Administered 2013-09-12: 0.5 mg via INTRAVENOUS
  Administered 2013-09-12: 0.25 mg via INTRAVENOUS

## 2013-09-12 MED ORDER — LIDOCAINE HCL (CARDIAC) 20 MG/ML IV SOLN
INTRAVENOUS | Status: DC | PRN
  Administered 2013-09-12: 80 mg via INTRAVENOUS

## 2013-09-12 MED ORDER — FENTANYL CITRATE 0.05 MG/ML IJ SOLN
INTRAMUSCULAR | Status: AC
  Filled 2013-09-12: qty 2

## 2013-09-12 MED ORDER — OXYCODONE HCL 5 MG/5ML PO SOLN: 5.0000 mg | Freq: Once | ORAL | Status: AC | PRN

## 2013-09-12 MED ORDER — LACTATED RINGERS IV SOLN
INTRAVENOUS | Status: DC
  Administered 2013-09-12: 09:00:00 via INTRAVENOUS

## 2013-09-12 SURGICAL SUPPLY — 37 items
BANDAGE ADH SHEER 1  50/CT (GAUZE/BANDAGES/DRESSINGS) IMPLANT
BANDAGE ELASTIC 3 VELCRO ST LF (GAUZE/BANDAGES/DRESSINGS) ×2 IMPLANT
BLADE SURG 15 STRL LF DISP TIS (BLADE) ×1 IMPLANT
BLADE SURG 15 STRL SS (BLADE) ×2
BNDG CMPR 9X4 STRL LF SNTH (GAUZE/BANDAGES/DRESSINGS) ×1
BNDG COHESIVE 3X5 TAN STRL LF (GAUZE/BANDAGES/DRESSINGS) ×1 IMPLANT
BNDG ESMARK 4X9 LF (GAUZE/BANDAGES/DRESSINGS) ×1 IMPLANT
BRUSH SCRUB EZ PLAIN DRY (MISCELLANEOUS) ×2 IMPLANT
CORDS BIPOLAR (ELECTRODE) ×1 IMPLANT
COVER MAYO STAND STRL (DRAPES) ×2 IMPLANT
COVER TABLE BACK 60X90 (DRAPES) ×2 IMPLANT
CUFF TOURNIQUET SINGLE 18IN (TOURNIQUET CUFF) ×1 IMPLANT
DECANTER SPIKE VIAL GLASS SM (MISCELLANEOUS) IMPLANT
DRAPE EXTREMITY T 121X128X90 (DRAPE) ×2 IMPLANT
DRAPE SURG 17X23 STRL (DRAPES) ×2 IMPLANT
GLOVE BIO SURGEON STRL SZ7 (GLOVE) ×1 IMPLANT
GLOVE BIOGEL M STRL SZ7.5 (GLOVE) ×1 IMPLANT
GLOVE ORTHO TXT STRL SZ7.5 (GLOVE) ×2 IMPLANT
GOWN BRE IMP PREV XXLGXLNG (GOWN DISPOSABLE) ×3 IMPLANT
GOWN PREVENTION PLUS XLARGE (GOWN DISPOSABLE) ×1 IMPLANT
NEEDLE 27GAX1X1/2 (NEEDLE) ×1 IMPLANT
PACK BASIN DAY SURGERY FS (CUSTOM PROCEDURE TRAY) ×2 IMPLANT
PAD CAST 3X4 CTTN HI CHSV (CAST SUPPLIES) ×1 IMPLANT
PADDING CAST ABS 4INX4YD NS (CAST SUPPLIES) ×1
PADDING CAST ABS COTTON 4X4 ST (CAST SUPPLIES) ×1 IMPLANT
PADDING CAST COTTON 3X4 STRL (CAST SUPPLIES) ×2
SPLINT PLASTER CAST XFAST 3X15 (CAST SUPPLIES) ×5 IMPLANT
SPLINT PLASTER XTRA FASTSET 3X (CAST SUPPLIES) ×5
SPONGE GAUZE 4X4 12PLY (GAUZE/BANDAGES/DRESSINGS) ×2 IMPLANT
STOCKINETTE 4X48 STRL (DRAPES) ×2 IMPLANT
STRIP CLOSURE SKIN 1/2X4 (GAUZE/BANDAGES/DRESSINGS) ×2 IMPLANT
SUT PROLENE 3 0 PS 2 (SUTURE) ×2 IMPLANT
SYR 3ML 23GX1 SAFETY (SYRINGE) IMPLANT
SYR CONTROL 10ML LL (SYRINGE) ×1 IMPLANT
TOWEL OR 17X24 6PK STRL BLUE (TOWEL DISPOSABLE) ×2 IMPLANT
TRAY DSU PREP LF (CUSTOM PROCEDURE TRAY) ×2 IMPLANT
UNDERPAD 30X30 INCONTINENT (UNDERPADS AND DIAPERS) ×2 IMPLANT

## 2013-09-12 NOTE — Anesthesia Preprocedure Evaluation (Addendum)
Anesthesia Evaluation  Patient identified by MRN, date of birth, ID band Patient awake    Reviewed: Allergy & Precautions, H&P , NPO status , Patient's Chart, lab work & pertinent test results  History of Anesthesia Complications Negative for: history of anesthetic complications  Airway Mallampati: I TM Distance: >3 FB Neck ROM: Full    Dental   Pulmonary former smoker,          Cardiovascular hypertension, Pt. on medications     Neuro/Psych  Headaches, Depression  Neuromuscular disease    GI/Hepatic   Endo/Other    Renal/GU      Musculoskeletal   Abdominal   Peds  Hematology   Anesthesia Other Findings   Reproductive/Obstetrics                         Anesthesia Physical Anesthesia Plan  ASA: II  Anesthesia Plan: General   Post-op Pain Management:    Induction: Intravenous  Airway Management Planned: LMA  Additional Equipment:   Intra-op Plan:   Post-operative Plan: Extubation in OR  Informed Consent: I have reviewed the patients History and Physical, chart, labs and discussed the procedure including the risks, benefits and alternatives for the proposed anesthesia with the patient or authorized representative who has indicated his/her understanding and acceptance.     Plan Discussed with: CRNA and Surgeon  Anesthesia Plan Comments:        Anesthesia Quick Evaluation

## 2013-09-12 NOTE — Transfer of Care (Signed)
Immediate Anesthesia Transfer of Care Note  Patient: Sheri [name]  Procedure(s) Performed: Procedure(s): RIGHT CARPAL TUNNEL RELEASE (Right)  Patient Location: PACU  Anesthesia Type:General  Level of Consciousness: awake, alert  and oriented  Airway & Oxygen Therapy: Patient Spontanous Breathing and Patient connected to face mask oxygen  Post-op Assessment: Report given to PACU RN and Post -op Vital signs reviewed and stable  Post vital signs: Reviewed and stable  Complications: No apparent anesthesia complications

## 2013-09-12 NOTE — Anesthesia Postprocedure Evaluation (Signed)
Anesthesia Post Note  Patient: Sheri [name]  Procedure(s) Performed: Procedure(s) (LRB): RIGHT CARPAL TUNNEL RELEASE (Right)  Anesthesia type: general  Patient location: PACU  Post pain: Pain level controlled  Post assessment: Patient's Cardiovascular Status Stable  Last Vitals:  Filed Vitals:   09/12/13 1130  BP: 144/52  Pulse: 56  Temp: 36.4 C  Resp: 18    Post vital signs: Reviewed and stable  Level of consciousness: sedated  Complications: No apparent anesthesia complications

## 2013-09-12 NOTE — Op Note (Signed)
[id number]  

## 2013-09-12 NOTE — Brief Op Note (Signed)
09/12/2013  10:10 AM  PATIENT:  Sheri [name]  62 y.o. female  PRE-OPERATIVE DIAGNOSIS:  RIGHT CARPAL TUNNEL SYNDROME  POST-OPERATIVE DIAGNOSIS:  RIGHT CARPAL TUNNEL SYNDROME  PROCEDURE:  Procedure(s): RIGHT CARPAL TUNNEL RELEASE (Right)  SURGEON:  Surgeon(s) and Role:    * Wyn Forster., MD - Primary  PHYSICIAN ASSISTANT:   ASSISTANTS: nurse  ANESTHESIA:   general  EBL:  Total I/O In: 600 [I.V.:600] Out: -   BLOOD ADMINISTERED:none  DRAINS: none   LOCAL MEDICATIONS USED:  XYLOCAINE   SPECIMEN:  No Specimen  DISPOSITION OF SPECIMEN:  N/A  COUNTS:  YES  TOURNIQUET:   Total Tourniquet Time Documented: Upper Arm (Right) - 13 minutes Total: Upper Arm (Right) - 13 minutes   DICTATION: .Other Dictation: Dictation Number [id number]  PLAN OF CARE: Discharge to home after PACU  PATIENT DISPOSITION:  PACU - hemodynamically stable.   Delay start of Pharmacological VTE agent (>24hrs) due to surgical blood loss or risk of bleeding: not applicable

## 2013-09-12 NOTE — Op Note (Signed)
[name], KIRT NO.:  [id number]  MEDICAL RECORD NO.:  [id number]  LOCATION:                                 FACILITY:  PHYSICIAN:  [name]. [name], M.D.      DATE OF BIRTH:  DATE OF PROCEDURE:  09/12/2013 DATE OF DISCHARGE:                              OPERATIVE REPORT   PREOPERATIVE DIAGNOSIS:  Chronic right carpal tunnel syndrome.  POSTOPERATIVE DIAGNOSIS:  Chronic right carpal tunnel syndrome.  OPERATION:  Release of right transverse carpal ligament.  OPERATING SURGEON:  [name]. [name], MD  ASSISTANT:  Nurse.  ANESTHESIA:  General by LMA.  SUPERVISING ANESTHESIOLOGIST:  [name]. [name], MD  INDICATIONS:  Sheri [name] is a 62 year old woman who presented for evaluation of a severe left first dorsal compartment stenosing synovitis and a chronic numbness of the right hand.  She had a prior right first dorsal compartment release, with a longitudinal incision, and had release of her left first dorsal compartment last year.  She returns at this time due to persistent numbness in the right hand. Electrodiagnostic studies have revealed very significant right carpal tunnel syndrome.  Due to failure to respond to nonoperative measures, she was brought to the operating room at this time for release of her right transverse carpal ligament.  Preoperatively, she was interviewed in the holding area.  We discussed her anticipated procedure.  Questions were invited and answered in detail.  Her right hand was marked as a proper surgical site per protocol with the marking pen.  Questions were invited and answered.  An Anesthesia consult was accomplished with Dr. [name].  General anesthesia by LMA technique was recommended and accepted.  DESCRIPTION OF PROCEDURE:  Sheri [name] was brought to room 6 of the [facility] and placed in supine position on the operating table.  Following induction of general anesthesia by LMA technique, the  right hand and arm were prepped with Betadine soap and solution, sterilely draped.  A pneumatic tourniquet was applied to the proximal right brachium.  Following exsanguination of the right arm with Esmarch bandage, arterial tourniquet was inflated to 250 mmHg.  Procedure commenced with a short incision in the line of the ring finger in the palm.  Subcutaneous tissues were carefully divided in the middle of the palmar fascia.  This was split longitudinally to reveal the common sensory branch of the median nerve.  These were followed back to the median nerve proper which was gently isolated from the transverse carpal ligament with a Insurance risk surveyor.  We carefully released the transverse carpal ligament along its ulnar border, extending into the distal forearm.  The volar forearm fascia was released subcutaneously over distance of approximately 4 cm.  This was widely opened carpal canal.  The ulnar bursa was thickened and fibrotic.  No masses or predicaments were noted.  Bleeding points along the margin of the released skin and transverse carpal ligament were electrocauterized with bipolar forceps.  The wound was then repaired with intradermal 3-0 Prolene.  Steri-Strips applied.  A 2% lidocaine was infiltrated along the wound margins for postoperative comfort.  The wound was then dressed with sterile gauze, sterile Webril,  and a volar plaster splint, maintaining the wrist in 15 degrees of dorsiflexion.  For aftercare, Sheri [name] was provided prescription for Dilaudid 2 mg 1 p.o. q.4-6 hours p.r.n. pain, 20 tablets without refill.     [name] [name], M.D.     RVS/MEDQ  D:  09/12/2013  T:  09/12/2013  Job:  [id number]

## 2013-09-12 NOTE — Anesthesia Procedure Notes (Signed)
Procedure Name: LMA Insertion Date/Time: 09/12/2013 9:45 AM Performed by: Burna Cash Pre-anesthesia Checklist: Patient identified, Emergency Drugs available, Suction available and Patient being monitored Patient Re-evaluated:Patient Re-evaluated prior to inductionOxygen Delivery Method: Circle System Utilized Preoxygenation: Pre-oxygenation with 100% oxygen Intubation Type: IV induction Ventilation: Mask ventilation without difficulty LMA: LMA inserted LMA Size: 4.0 Number of attempts: 1 Airway Equipment and Method: bite block Placement Confirmation: positive ETCO2 Tube secured with: Tape Dental Injury: Teeth and Oropharynx as per pre-operative assessment

## 2013-09-18 ENCOUNTER — Encounter (HOSPITAL_BASED_OUTPATIENT_CLINIC_OR_DEPARTMENT_OTHER): Payer: Self-pay | Admitting: Orthopedic Surgery

## 2013-09-19 ENCOUNTER — Telehealth: Payer: Self-pay

## 2013-09-19 NOTE — Telephone Encounter (Signed)
The patient called and is hoping to get her tramadol rx called in   pts callback - [phone]  Thanks!

## 2013-09-19 NOTE — Telephone Encounter (Signed)
Okay for Tramadol refill? 

## 2013-09-20 MED ORDER — TRAMADOL HCL 50 MG PO TABS: 50.0000 mg | ORAL_TABLET | Freq: Three times a day (TID) | ORAL | Status: DC | PRN

## 2013-09-20 NOTE — Telephone Encounter (Signed)
Called [phone] and left patient a message to let her know Tramadol has been called to pharmacy

## 2013-09-20 NOTE — Telephone Encounter (Signed)
k

## 2013-09-22 ENCOUNTER — Telehealth: Payer: Self-pay | Admitting: Internal Medicine

## 2013-09-22 ENCOUNTER — Ambulatory Visit (AMBULATORY_SURGERY_CENTER): Payer: BC Managed Care – PPO | Admitting: *Deleted

## 2013-09-22 VITALS — Ht 67.0 in | Wt 159.0 lb

## 2013-09-22 DIAGNOSIS — Z1211 Encounter for screening for malignant neoplasm of colon: Secondary | ICD-10-CM

## 2013-09-22 MED ORDER — NA SULFATE-K SULFATE-MG SULF 17.5-3.13-1.6 GM/177ML PO SOLN: ORAL | Status: DC

## 2013-09-22 NOTE — Progress Notes (Signed)
No egg or soy allergy 

## 2013-09-22 NOTE — Telephone Encounter (Signed)
Left message on patient's voicemail samples are at the front desk

## 2013-09-22 NOTE — Telephone Encounter (Signed)
Requesting samples of benicar

## 2013-09-25 ENCOUNTER — Encounter: Payer: Self-pay | Admitting: Internal Medicine

## 2013-09-29 ENCOUNTER — Other Ambulatory Visit: Payer: Self-pay | Admitting: Internal Medicine

## 2013-10-02 ENCOUNTER — Ambulatory Visit (AMBULATORY_SURGERY_CENTER): Payer: BC Managed Care – PPO | Admitting: Internal Medicine

## 2013-10-02 ENCOUNTER — Encounter: Payer: Self-pay | Admitting: Internal Medicine

## 2013-10-02 VITALS — BP 146/81 | HR 57 | Temp 97.9°F | Resp 14 | Ht 67.0 in | Wt 159.0 lb

## 2013-10-02 DIAGNOSIS — Z1211 Encounter for screening for malignant neoplasm of colon: Secondary | ICD-10-CM

## 2013-10-02 DIAGNOSIS — D126 Benign neoplasm of colon, unspecified: Secondary | ICD-10-CM

## 2013-10-02 MED ORDER — SODIUM CHLORIDE 0.9 % IV SOLN: 500.0000 mL | INTRAVENOUS | Status: DC

## 2013-10-02 NOTE — Progress Notes (Signed)
Called to room to assist during endoscopic procedure.  Patient ID and intended procedure confirmed with present staff. Received instructions for my participation in the procedure from the performing physician.  

## 2013-10-02 NOTE — Op Note (Signed)
[facility]  [address] [place], [address]   COLONOSCOPY PROCEDURE REPORT  PATIENT: Sheri [name], Sheri [name]  MR#: [id number] BIRTHDATE: June 18, 1951 , 62  yrs. old GENDER: Female ENDOSCOPIST: Gatha Mayer, MD, [facility] REFERRED TW:SFKCLEX Marquis Lunch, M.D. PROCEDURE DATE:  10/02/2013 PROCEDURE:   Colonoscopy with snare polypectomy First Screening Colonoscopy - Avg.  risk and is 50 yrs.  old or older Yes.  Prior Negative Screening - Now for repeat screening. N/A  History of Adenoma - Now for follow-up colonoscopy & has been > or = to 3 yrs.  N/A  Polyps Removed Today? Yes. ASA CLASS:   Class II INDICATIONS:average risk screening and first colonoscopy. MEDICATIONS: Propofol (Diprivan) 380 mg IV  DESCRIPTION OF PROCEDURE:   After the risks benefits and alternatives of the procedure were thoroughly explained, informed consent was obtained.  A digital rectal exam revealed no abnormalities of the rectum.   The LB [id number]-[id number]  endoscope was introduced through the anus and advanced to the cecum, which was identified by both the appendix and ileocecal valve. No adverse events experienced.   The quality of the prep was Suprep good  The instrument was then slowly withdrawn as the colon was fully examined.  COLON FINDINGS: Two sessile polyps measuring 4 and 10 mm in size were found in the distal transverse colon and descending colon.  A polypectomy was performed using snare cautery and with a cold snare.  The resection was complete and the polyp tissue was completely retrieved.   The colon mucosa was otherwise normal.   A right colon retroflexion was performed.  Retroflexed views revealed no abnormalities. The time to cecum=2 minutes 02 seconds. Withdrawal time=11 minutes 54 seconds.  The scope was withdrawn and the procedure completed. COMPLICATIONS: There were no complications.  ENDOSCOPIC IMPRESSION: 1.   Two sessile polyps measuring 4 and 10 mm in size were found in the  distal transverse colon and descending colon; polypectomy was performed using snare cautery and with a cold snare 2.   The colon mucosa was otherwise normal - good prep  RECOMMENDATIONS: 1.  Timing of repeat colonoscopy will be determined by pathology findings. 2.  Hold aspirin, aspirin products, and anti-inflammatory medication for 2 weeks.   eSigned:  Gatha Mayer, MD, [facility] 10/02/2013 4:28 PM   cc: Neena Rhymes, MD and The Patient

## 2013-10-02 NOTE — Progress Notes (Signed)
Procedure ends, to recovery, report given and VSS. 

## 2013-10-02 NOTE — Progress Notes (Addendum)
NO EGG OR SOY ALLERGY. EWM NO PROBLEMS WITH PAST SEDATION. EWM CRNA AND MD MADE AWARE PT ATE A FEW CRACKERS THIS AM WITH NO NEW ORDERS. EWM

## 2013-10-02 NOTE — Patient Instructions (Addendum)
I found and removed two polyps that look benign.  I will let you know pathology results and when to have another routine colonoscopy by mail.  I appreciate the opportunity to care for you. Gatha Mayer, MD, FACG  YOU HAD AN ENDOSCOPIC PROCEDURE TODAY AT [facility]: Refer to the procedure report that was given to you for any specific questions about what was found during the examination.  If the procedure report does not answer your questions, please call your gastroenterologist to clarify.  If you requested that your care partner not be given the details of your procedure findings, then the procedure report has been included in a sealed envelope for you to review at your convenience later.  YOU SHOULD EXPECT: Some feelings of bloating in the abdomen. Passage of more gas than usual.  Walking can help get rid of the air that was put into your GI tract during the procedure and reduce the bloating. If you had a lower endoscopy (such as a colonoscopy or flexible sigmoidoscopy) you may notice spotting of blood in your stool or on the toilet paper. If you underwent a bowel prep for your procedure, then you may not have a normal bowel movement for a few days.  DIET: Your first meal following the procedure should be a light meal and then it is ok to progress to your normal diet.  A half-sandwich or bowl of soup is an example of a good first meal.  Heavy or fried foods are harder to digest and may make you feel nauseous or bloated.  Likewise meals heavy in dairy and vegetables can cause extra gas to form and this can also increase the bloating.  Drink plenty of fluids but you should avoid alcoholic beverages for 24 hours.  ACTIVITY: Your care partner should take you home directly after the procedure.  You should plan to take it easy, moving slowly for the rest of the day.  You can resume normal activity the day after the procedure however you should NOT DRIVE or use heavy machinery for 24  hours (because of the sedation medicines used during the test).    SYMPTOMS TO REPORT IMMEDIATELY: A gastroenterologist can be reached at any hour.  During normal business hours, 8:30 AM to 5:00 PM Monday through Friday, call [phone].  After hours and on weekends, please call the GI answering service at [phone] who will take a message and have the physician on call contact you.   Following lower endoscopy (colonoscopy or flexible sigmoidoscopy):  Excessive amounts of blood in the stool  Significant tenderness or worsening of abdominal pains  Swelling of the abdomen that is new, acute  Fever of 100F or higher   FOLLOW UP: If any biopsies were taken you will be contacted by phone or by letter within the next 1-3 weeks.  Call your gastroenterologist if you have not heard about the biopsies in 3 weeks.  Our staff will call the home number listed on your records the next business day following your procedure to check on you and address any questions or concerns that you may have at that time regarding the information given to you following your procedure. This is a courtesy call and so if there is no answer at the home number and we have not heard from you through the emergency physician on call, we will assume that you have returned to your regular daily activities without incident.  SIGNATURES/CONFIDENTIALITY: You and/or your care partner have  signed paperwork which will be entered into your electronic medical record.  These signatures attest to the fact that that the information above on your After Visit Summary has been reviewed and is understood.  Full responsibility of the confidentiality of this discharge information lies with you and/or your care-partner.  Polyps-handout given  Repeat colonoscopy will be determined by pathology.  Hold aspirin, aspirin products, and anti-inflammatory medications( ibuprofen, advil, motrin, mobic, etc) for 2 weeks. Until 10/16/13

## 2013-10-03 ENCOUNTER — Telehealth: Payer: Self-pay

## 2013-10-03 NOTE — Telephone Encounter (Signed)
No answer, left message to call [facility] if questions or concerns following procedure

## 2013-10-09 ENCOUNTER — Encounter: Payer: Self-pay | Admitting: Internal Medicine

## 2013-10-09 DIAGNOSIS — Z860100 Personal history of colon polyps, unspecified: Secondary | ICD-10-CM

## 2013-10-09 HISTORY — DX: Personal history of colon polyps, unspecified: Z86.0100

## 2013-10-09 HISTORY — DX: Personal history of colonic polyps: Z86.010

## 2013-10-09 NOTE — Progress Notes (Signed)
Quick Note:  Two tubular adenomas max 10 mm Repeat colonoscopy 2018 (11yrs) ______

## 2013-10-30 ENCOUNTER — Telehealth: Payer: Self-pay | Admitting: Internal Medicine

## 2013-10-30 NOTE — Telephone Encounter (Signed)
Patient is calling to request Benicar samples if available. Please advise.

## 2013-10-30 NOTE — Telephone Encounter (Signed)
Phoned patient letting her know samples are at the front desk.

## 2013-11-17 ENCOUNTER — Other Ambulatory Visit: Payer: Self-pay | Admitting: Internal Medicine

## 2013-11-21 ENCOUNTER — Telehealth: Payer: Self-pay | Admitting: *Deleted

## 2013-11-21 NOTE — Telephone Encounter (Signed)
Patient phoned requesting benicar samples.  A month's worth is available for p/u and patient notified via voicemail message.

## 2013-12-15 ENCOUNTER — Telehealth: Payer: Self-pay

## 2013-12-15 NOTE — Telephone Encounter (Signed)
Patient has been advised via voicemail

## 2013-12-15 NOTE — Telephone Encounter (Signed)
At this office - Stacy Gardner, PA or on the list for Dr. Doug Sou in September with coverage by the available staff in the interim.    If willing to go to [place] - Dr. Maudie Mercury

## 2013-12-15 NOTE — Telephone Encounter (Signed)
Phone call from patient wishing you well in your retirement. She also would like to know who her new pcp should be? Please advise.

## 2014-01-19 ENCOUNTER — Encounter: Payer: Self-pay | Admitting: Internal Medicine

## 2014-01-19 ENCOUNTER — Encounter: Payer: Self-pay | Admitting: Radiology

## 2014-01-19 ENCOUNTER — Ambulatory Visit (INDEPENDENT_AMBULATORY_CARE_PROVIDER_SITE_OTHER): Payer: BC Managed Care – PPO | Admitting: Internal Medicine

## 2014-01-19 VITALS — BP 180/100 | HR 72 | Temp 97.8°F | Wt 180.0 lb

## 2014-01-19 DIAGNOSIS — K219 Gastro-esophageal reflux disease without esophagitis: Secondary | ICD-10-CM | POA: Insufficient documentation

## 2014-01-19 DIAGNOSIS — IMO0001 Reserved for inherently not codable concepts without codable children: Secondary | ICD-10-CM

## 2014-01-19 DIAGNOSIS — I10 Essential (primary) hypertension: Secondary | ICD-10-CM

## 2014-01-19 MED ORDER — LISINOPRIL 40 MG PO TABS: 40.0000 mg | ORAL_TABLET | Freq: Every day | ORAL | Status: DC

## 2014-01-19 MED ORDER — OMEPRAZOLE 20 MG PO CPDR: 20.0000 mg | DELAYED_RELEASE_CAPSULE | Freq: Every day | ORAL | Status: DC

## 2014-01-19 MED ORDER — TRAMADOL HCL 50 MG PO TABS: 50.0000 mg | ORAL_TABLET | Freq: Three times a day (TID) | ORAL | Status: DC | PRN

## 2014-01-19 MED ORDER — CYCLOBENZAPRINE HCL 10 MG PO TABS: 5.0000 mg | ORAL_TABLET | Freq: Every evening | ORAL | Status: DC | PRN

## 2014-01-19 NOTE — Progress Notes (Signed)
Pre visit review using our clinic review tool, if applicable. No additional management support is needed unless otherwise documented below in the visit note. 

## 2014-01-19 NOTE — Progress Notes (Signed)
Subjective:    Patient ID: Sheri [name], female    DOB: 05/07/1951, 63 y.o.   MRN: [id number]  HPI Transferring care--Dr Norins retired Works nearby  Ongoing BP issues Ran out of both furosemide and benicar about a month ago Had some money issues Ran out of samples Bad headache now and recently---asa not helping.  No chest pain of note---feels she gets gas at times No SOB  Has neck pain and down arms Chronic fibromyalgia No set exercise--walks some at work Never sleeps well--has tried some OTC meds without success Nocturia x 3-4  No major depressive symptoms now  Current Outpatient Prescriptions on File Prior to Visit  Medication Sig Dispense Refill  . aspirin 81 MG tablet Take 81 mg by mouth daily.      Marland Kitchen dicyclomine (BENTYL) 10 MG capsule Take 10 mg by mouth 3 (three) times daily as needed for spasms.      . fish oil-omega-3 fatty acids 1000 MG capsule Take 2 g by mouth daily.      . furosemide (LASIX) 40 MG tablet Take 1 tablet (40 mg total) by mouth daily.  30 tablet  11  . Multiple Vitamins-Minerals (MULTIVITAMIN WITH MINERALS) tablet Take 1 tablet by mouth daily.      Marland Kitchen olmesartan (BENICAR) 20 MG tablet Take 1 tablet (20 mg total) by mouth daily.      . tamsulosin (FLOMAX) 0.4 MG CAPS capsule Take 1 capsule (0.4 mg total) by mouth every evening.  30 capsule  11  . traMADol (ULTRAM) 50 MG tablet TAKE ONE TABLET BY MOUTH EVERY 8 HOURS AS NEEDED FOR PAIN  180 tablet  0   No current facility-administered medications on file prior to visit.    Allergies  Allergen Reactions  . Codeine Nausea And Vomiting    Past Medical History  Diagnosis Date  . Wrist fracture, left 1/14  . Hypertension   . IBS (irritable bowel syndrome)   . Nocturia   . Allergy   . Personal history of colonic adenomas 10/09/2013    Past Surgical History  Procedure Laterality Date  . Tubal ligation    . Abdominal hysterectomy    . Carpal tunnel release  1982    rt  . Tonsillectomy    .  Dorsal compartment release Left 03/16/2013    Procedure: LEFT FIRST RELEASE DORSAL COMPARTMENT (DEQUERVAIN) EXCISION OF CYST  A1 PULLEY LEFT THUMB;  Surgeon: Cammie Sickle., MD;  Location: [facility];  Service: Orthopedics;  Laterality: Left;  . Carpal tunnel release Right 09/12/2013    Procedure: RIGHT CARPAL TUNNEL RELEASE;  Surgeon: Cammie Sickle., MD;  Location: [facility];  Service: Orthopedics;  Laterality: Right;    Family History  Problem Relation Age of Onset  . Colon cancer Neg Hx   . Esophageal cancer Neg Hx   . Rectal cancer Neg Hx   . Stomach cancer Neg Hx     History   Social History  . Marital Status: Divorced    Spouse Name: N/A    Number of Children: 2  . Years of Education: 36   Occupational History  . dye house worker    Social History Main Topics  . Smoking status: Former Smoker    Types: Cigarettes    Quit date: 10/13/2002  . Smokeless tobacco: Never Used  . Alcohol Use: No  . Drug Use: No  . Sexual Activity: No   Other Topics Concern  . Not  on file   Social History Narrative   HSG. 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 Married '73 - 18 yr/divorced. 1 dtr - '82, 1 son - '73; 1 grandchild. Lives in her own place, daughter lives with her. Work - housekeeping at [employer], works in [employer] for [employer]. Mother with dementia and she is a responsible family member.    Review of Systems Lost 20-25# after starting the BP meds---now gained it back Bowels are okay Has heartburn---occ dysphagia    Objective:   Physical Exam  Constitutional: She appears well-developed and well-nourished. No distress.  Neck: Normal range of motion. Neck supple.  Right posterior non tender cervical node Thick neck--doesn't seem to be thyroid though  Cardiovascular: Normal rate, regular rhythm and normal heart sounds.  Exam reveals no gallop.   No murmur heard. Pulmonary/Chest: Effort normal and breath sounds normal. No respiratory distress. She  has no wheezes. She has no rales.  Abdominal: Soft. There is no tenderness.  Sensitive in RLQ  Musculoskeletal: She exhibits no edema and no tenderness.  Psychiatric: She has a normal mood and affect. Her behavior is normal.          Assessment & Plan:

## 2014-01-19 NOTE — Patient Instructions (Signed)
Please take the 2 blood pressure medications every day---lisinopril and furosemide. Call if you have any problems with this. Try the cyclobenzaprine at bedtime---to see if that helps you sleep. Start with a half tab, and increase to a full one as needed. Take the omeprazole daily on an empty stomach for the heartburn. Use it daily for at least 6 weeks.

## 2014-01-19 NOTE — Assessment & Plan Note (Signed)
With some dysphagia Just using tums a lot  Will start omeprazole--use for at least 6 weeks

## 2014-01-19 NOTE — Assessment & Plan Note (Signed)
Discussed regular exercise Trial cyclobenzaprine at bedtime Refill tramadol

## 2014-01-19 NOTE — Assessment & Plan Note (Signed)
BP Readings from Last 3 Encounters:  01/19/14 180/100  10/02/13 146/81  09/12/13 144/52   Out of control since off meds Will change to lisinopril since didn't fill other due to cost

## 2014-01-22 ENCOUNTER — Telehealth: Payer: Self-pay | Admitting: Internal Medicine

## 2014-01-22 NOTE — Telephone Encounter (Signed)
Relevant patient education assigned to patient using Emmi. ° °

## 2014-01-25 ENCOUNTER — Ambulatory Visit: Payer: BC Managed Care – PPO | Admitting: Family Medicine

## 2014-02-14 ENCOUNTER — Encounter: Payer: Self-pay | Admitting: Internal Medicine

## 2014-02-16 ENCOUNTER — Other Ambulatory Visit: Payer: Self-pay | Admitting: Internal Medicine

## 2014-02-16 NOTE — Telephone Encounter (Signed)
Okay #90 x 0 

## 2014-02-16 NOTE — Telephone Encounter (Signed)
rx called into pharmacy

## 2014-02-16 NOTE — Telephone Encounter (Signed)
01/19/14 

## 2014-03-12 ENCOUNTER — Ambulatory Visit (INDEPENDENT_AMBULATORY_CARE_PROVIDER_SITE_OTHER): Payer: BC Managed Care – PPO | Admitting: Internal Medicine

## 2014-03-12 ENCOUNTER — Encounter: Payer: Self-pay | Admitting: Internal Medicine

## 2014-03-12 VITALS — BP 160/80 | HR 79 | Temp 98.0°F | Wt 178.0 lb

## 2014-03-12 DIAGNOSIS — K219 Gastro-esophageal reflux disease without esophagitis: Secondary | ICD-10-CM

## 2014-03-12 DIAGNOSIS — M25551 Pain in right hip: Secondary | ICD-10-CM | POA: Insufficient documentation

## 2014-03-12 DIAGNOSIS — I10 Essential (primary) hypertension: Secondary | ICD-10-CM

## 2014-03-12 DIAGNOSIS — IMO0001 Reserved for inherently not codable concepts without codable children: Secondary | ICD-10-CM

## 2014-03-12 DIAGNOSIS — M25559 Pain in unspecified hip: Secondary | ICD-10-CM

## 2014-03-12 LAB — RENAL FUNCTION PANEL
Albumin: 4.4 g/dL (ref 3.5–5.2)
BUN: 16 mg/dL (ref 6–23)
CO2: 30 mEq/L (ref 19–32)
Calcium: 10 mg/dL (ref 8.4–10.5)
Chloride: 104 mEq/L (ref 96–112)
Creatinine, Ser: 1 mg/dL (ref 0.4–1.2)
GFR: 77.04 mL/min (ref >60.00)
GLUCOSE: 98 mg/dL (ref 70–99)
PHOSPHORUS: 3.3 mg/dL (ref 2.3–4.6)
POTASSIUM: 4 meq/L (ref 3.5–5.1)
Sodium: 141 mEq/L (ref 135–145)

## 2014-03-12 MED ORDER — CYCLOBENZAPRINE HCL 10 MG PO TABS: 5.0000 mg | ORAL_TABLET | Freq: Every evening | ORAL | Status: DC | PRN

## 2014-03-12 MED ORDER — TRAMADOL HCL 50 MG PO TABS: 50.0000 mg | ORAL_TABLET | Freq: Three times a day (TID) | ORAL | Status: DC | PRN

## 2014-03-12 NOTE — Assessment & Plan Note (Signed)
Some improvement with sleep with cyclobenzaprine Will continue

## 2014-03-12 NOTE — Patient Instructions (Signed)
Please call if your right hip pain is not better in the next week or 2---I will refer you for further evaluation.

## 2014-03-12 NOTE — Assessment & Plan Note (Signed)
Doesn't seem to be in the hip or the bursa Will try ice Refer to ortho or Dr Lorelei Pont if continues

## 2014-03-12 NOTE — Addendum Note (Signed)
Addended by: Despina Hidden on: 03/12/2014 02:13 PM   Modules accepted: Orders

## 2014-03-12 NOTE — Progress Notes (Signed)
Subjective:    Patient ID: Sheri [name], female    DOB: 12-Dec-1950, 63 y.o.   MRN: [id number]  HPI Had some pain in head at work and broke out in sweat--about a week after last visit Went away on its own No dizziness No recurrence  Then had sinus symptoms (sniffling and congestion)that worked down into her chest---seemed to be better after about 3 weeks Still some mucus Taking the acid med now Still has gas in stomach---OTC gas med not helping No swallowing except with dry bread  Pain in right hip---had to skip church yesterday Better with some heat No meds No known injury Still painful   Has been regular with the BP meds No apparent problem with these Has been trying to walk  Current Outpatient Prescriptions on File Prior to Visit  Medication Sig Dispense Refill  . aspirin 81 MG tablet Take 81 mg by mouth daily.      . cyclobenzaprine (FLEXERIL) 10 MG tablet Take 0.5-1 tablets (5-10 mg total) by mouth at bedtime as needed for muscle spasms.  30 tablet  1  . dicyclomine (BENTYL) 10 MG capsule Take 10 mg by mouth 3 (three) times daily as needed for spasms.      . fish oil-omega-3 fatty acids 1000 MG capsule Take 2 g by mouth daily.      . furosemide (LASIX) 40 MG tablet Take 1 tablet (40 mg total) by mouth daily.  30 tablet  11  . lisinopril (PRINIVIL,ZESTRIL) 40 MG tablet Take 1 tablet (40 mg total) by mouth daily.  90 tablet  3  . Multiple Vitamins-Minerals (MULTIVITAMIN WITH MINERALS) tablet Take 1 tablet by mouth daily.      Marland Kitchen omeprazole (PRILOSEC) 20 MG capsule Take 1 capsule (20 mg total) by mouth daily.  30 capsule  3  . tamsulosin (FLOMAX) 0.4 MG CAPS capsule Take 1 capsule (0.4 mg total) by mouth every evening.  30 capsule  11  . traMADol (ULTRAM) 50 MG tablet TAKE 1 TABLET BY MOUTH THREE TIMES DAILY AS NEEDED  90 tablet  0   No current facility-administered medications on file prior to visit.    Allergies  Allergen Reactions  . Codeine Nausea And Vomiting     Past Medical History  Diagnosis Date  . Wrist fracture, left 1/14  . Hypertension   . IBS (irritable bowel syndrome)   . Nocturia   . Allergy   . Personal history of colonic adenomas 10/09/2013  . GERD (gastroesophageal reflux disease)     Past Surgical History  Procedure Laterality Date  . Tubal ligation    . Abdominal hysterectomy    . Carpal tunnel release  1982    rt  . Tonsillectomy    . Dorsal compartment release Left 03/16/2013    Procedure: LEFT FIRST RELEASE DORSAL COMPARTMENT (DEQUERVAIN) EXCISION OF CYST  A1 PULLEY LEFT THUMB;  Surgeon: Cammie Sickle., MD;  Location: [place];  Service: Orthopedics;  Laterality: Left;  . Carpal tunnel release Right 09/12/2013    Procedure: RIGHT CARPAL TUNNEL RELEASE;  Surgeon: Cammie Sickle., MD;  Location: [place];  Service: Orthopedics;  Laterality: Right;    Family History  Problem Relation Age of Onset  . Colon cancer Neg Hx   . Esophageal cancer Neg Hx   . Rectal cancer Neg Hx   . Stomach cancer Neg Hx   . Heart disease Neg Hx   . Dementia Mother  Lewy body  . Hypertension Sister   . Cancer Sister     breast    History   Social History  . Marital Status: Divorced    Spouse Name: N/A    Number of Children: 2  . 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 Smoking status: Former Smoker    Types: Cigarettes    Quit date: 10/13/2002  . Smokeless tobacco: Never Used  . Alcohol Use: No  . Drug Use: No  . Sexual Activity: No   Other Topics Concern  . 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 Married '73 - 18 yr/divorced. 1 dtr - '82, 1 son - '73; 1 grandchild.    Lives in her own place, daughter lives with her.    Mother with dementia and she is a responsible family member.    Review of Systems Sleeping better at night with full flexeril Body is "more  mellow" Weight down 2#    Objective:   Physical Exam  Constitutional: She appears well-developed and well-nourished. No distress.  Neck: Normal range of motion. Neck supple. No thyromegaly present.  Cardiovascular: Normal rate, regular rhythm and normal heart sounds.  Exam reveals no gallop.   No murmur heard. Pulmonary/Chest: Effort normal and breath sounds normal. No respiratory distress. She has no wheezes. She has no rales.  Musculoskeletal:  Pain is posterior above the right hip Normal passive ROM in right hip  Lymphadenopathy:    She has no cervical adenopathy.  Neurological:  Antalgic gait  Psychiatric: She has a normal mood and affect. Her behavior is normal.          Assessment & Plan:

## 2014-03-12 NOTE — Assessment & Plan Note (Signed)
Still mild dysphagia Don't think the current cough is related

## 2014-03-12 NOTE — Progress Notes (Signed)
Pre visit review using our clinic review tool, if applicable. No additional management support is needed unless otherwise documented below in the visit note. 

## 2014-03-12 NOTE — Assessment & Plan Note (Signed)
BP Readings from Last 3 Encounters:  03/12/14 160/80  01/19/14 180/100  10/02/13 146/81   Same on the right by me Better but not great No change for now Will add new med if still up above 150 in 2 months

## 2014-03-20 ENCOUNTER — Ambulatory Visit: Payer: BC Managed Care – PPO | Admitting: Internal Medicine

## 2014-04-12 ENCOUNTER — Other Ambulatory Visit: Payer: Self-pay | Admitting: Internal Medicine

## 2014-04-13 NOTE — Telephone Encounter (Signed)
Okay #90 x 0 

## 2014-04-13 NOTE — Telephone Encounter (Signed)
rx called into pharmacy

## 2014-04-13 NOTE — Telephone Encounter (Signed)
03/12/14 

## 2014-05-11 ENCOUNTER — Ambulatory Visit (INDEPENDENT_AMBULATORY_CARE_PROVIDER_SITE_OTHER): Payer: BC Managed Care – PPO | Admitting: Internal Medicine

## 2014-05-11 ENCOUNTER — Encounter: Payer: Self-pay | Admitting: Internal Medicine

## 2014-05-11 VITALS — BP 180/80 | HR 75 | Temp 98.4°F | Wt 177.0 lb

## 2014-05-11 DIAGNOSIS — I10 Essential (primary) hypertension: Secondary | ICD-10-CM

## 2014-05-11 MED ORDER — AMLODIPINE BESYLATE 5 MG PO TABS: 5.0000 mg | ORAL_TABLET | Freq: Every day | ORAL | Status: DC

## 2014-05-11 MED ORDER — TRAMADOL HCL 50 MG PO TABS: 50.0000 mg | ORAL_TABLET | Freq: Three times a day (TID) | ORAL | Status: DC | PRN

## 2014-05-11 NOTE — Assessment & Plan Note (Signed)
BP Readings from Last 3 Encounters:  05/11/14 180/80  03/12/14 160/80  01/19/14 180/100   Lots of stress and she fires up easily discussing mom Will add amlodipine

## 2014-05-11 NOTE — Patient Instructions (Signed)
[employer]

## 2014-05-11 NOTE — Progress Notes (Signed)
Subjective:    Patient ID: Sheri [name], female    DOB: Oct 25, 1950, 63 y.o.   MRN: [id number]  HPI Having stress with mom---bad dementia. Not happy with the place where she is Not checking her BP lately No headaches --unless she overdoes it with salt (would Goody powders if they come on) No chest pain No SOB  Still with hip pain Gets burning pain in posterior upper right thigh--notes it when getting in thigh  May occur when standing No meds for this---doesn't take NSAIDs  Current Outpatient Prescriptions on File Prior to Visit  Medication Sig Dispense Refill  . aspirin 81 MG tablet Take 81 mg by mouth daily.      . cyclobenzaprine (FLEXERIL) 10 MG tablet Take 0.5-1 tablets (5-10 mg total) by mouth at bedtime as needed for muscle spasms.  30 tablet  1  . fish oil-omega-3 fatty acids 1000 MG capsule Take 2 g by mouth daily.      . furosemide (LASIX) 40 MG tablet Take 1 tablet (40 mg total) by mouth daily.  30 tablet  11  . lisinopril (PRINIVIL,ZESTRIL) 40 MG tablet Take 1 tablet (40 mg total) by mouth daily.  90 tablet  3  . Multiple Vitamins-Minerals (MULTIVITAMIN WITH MINERALS) tablet Take 1 tablet by mouth daily.      Marland Kitchen omeprazole (PRILOSEC) 20 MG capsule Take 1 capsule (20 mg total) by mouth daily.  30 capsule  3  . tamsulosin (FLOMAX) 0.4 MG CAPS capsule Take 1 capsule (0.4 mg total) by mouth every evening.  30 capsule  11  . traMADol (ULTRAM) 50 MG tablet TAKE 1 TABLET BY MOUTH THREE TIMES DAILY AS NEEDED  90 tablet  0   No current facility-administered medications on file prior to visit.    Allergies  Allergen Reactions  . Codeine Nausea And Vomiting    Past Medical History  Diagnosis Date  . Wrist fracture, left 1/14  . Hypertension   . IBS (irritable bowel syndrome)   . Nocturia   . Allergy   . Personal history of colonic adenomas 10/09/2013  . GERD (gastroesophageal reflux disease)     Past Surgical History  Procedure Laterality Date  . Tubal ligation    .  Abdominal hysterectomy    . Carpal tunnel release  1982    rt  . Tonsillectomy    . Dorsal compartment release Left 03/16/2013    Procedure: LEFT FIRST RELEASE DORSAL COMPARTMENT (DEQUERVAIN) EXCISION OF CYST  A1 PULLEY LEFT THUMB;  Surgeon: Cammie Sickle., MD;  Location: [place];  Service: Orthopedics;  Laterality: Left;  . Carpal tunnel release Right 09/12/2013    Procedure: RIGHT CARPAL TUNNEL RELEASE;  Surgeon: Cammie Sickle., MD;  Location: [place];  Service: Orthopedics;  Laterality: Right;    Family History  Problem Relation Age of Onset  . Colon cancer Neg Hx   . Esophageal cancer Neg Hx   . Rectal cancer Neg Hx   . Stomach cancer Neg Hx   . Heart disease Neg Hx   . Dementia Mother     Lewy body  . Hypertension Sister   . Cancer Sister     breast    History   Social History  . Marital Status: Divorced    Spouse Name: N/A    Number of Children: 2  . 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 Smoking status: Former Smoker    Types: Cigarettes    Quit date: 10/13/2002  . Smokeless tobacco: Never Used  . Alcohol Use: No  . Drug Use: No  . Sexual Activity: No   Other Topics Concern  . Not on file   Social History Narrative   HSG. [place] [place]- criminal justice. Married '73 - 18 yr/divorced. 1 dtr - '82, 1 son - '73; 1 grandchild.    Lives in her own place, daughter lives with her.    Mother with dementia and she is a responsible family member.    Review of Systems Weight is stable Sleep is disjointed--awakening again    Objective:   Physical Exam  Constitutional: She appears well-developed and well-nourished. No distress.  Neck: Normal range of motion. Neck supple. No thyromegaly present.  Cardiovascular: Normal rate, regular rhythm and normal heart sounds.  Exam reveals no gallop.   No murmur heard. Pulmonary/Chest: Effort normal and breath sounds  normal. No respiratory distress. She has no wheezes. She has no rales.  Musculoskeletal: She exhibits no edema and no tenderness.  Lymphadenopathy:    She has no cervical adenopathy.  Psychiatric:  Upset about mom's care, etc          Assessment & Plan:

## 2014-05-13 ENCOUNTER — Other Ambulatory Visit: Payer: Self-pay | Admitting: Internal Medicine

## 2014-05-19 ENCOUNTER — Other Ambulatory Visit: Payer: Self-pay | Admitting: Internal Medicine

## 2014-05-22 LAB — HM MAMMOGRAPHY: HM Mammogram: NORMAL

## 2014-06-06 ENCOUNTER — Other Ambulatory Visit: Payer: Self-pay | Admitting: Obstetrics and Gynecology

## 2014-06-07 LAB — CYTOLOGY - PAP

## 2014-06-08 ENCOUNTER — Other Ambulatory Visit: Payer: Self-pay | Admitting: Internal Medicine

## 2014-06-08 NOTE — Telephone Encounter (Signed)
rx called into pharmacy

## 2014-06-08 NOTE — Telephone Encounter (Signed)
05/11/14 

## 2014-06-08 NOTE — Telephone Encounter (Signed)
Okay #90 x 0 

## 2014-06-18 ENCOUNTER — Other Ambulatory Visit: Payer: Self-pay | Admitting: Internal Medicine

## 2014-06-20 NOTE — Telephone Encounter (Signed)
#  30x0 last filled on 05/14/14. Last ov was on 05/11/14, next scheduled appt is for on 06/22/14.

## 2014-06-20 NOTE — Telephone Encounter (Signed)
Okay #30 x 0 

## 2014-06-22 ENCOUNTER — Encounter: Payer: Self-pay | Admitting: Internal Medicine

## 2014-06-22 ENCOUNTER — Ambulatory Visit (INDEPENDENT_AMBULATORY_CARE_PROVIDER_SITE_OTHER): Payer: BC Managed Care – PPO | Admitting: Internal Medicine

## 2014-06-22 VITALS — BP 128/80 | HR 86 | Temp 98.5°F | Wt 181.0 lb

## 2014-06-22 DIAGNOSIS — I10 Essential (primary) hypertension: Secondary | ICD-10-CM

## 2014-06-22 NOTE — Assessment & Plan Note (Signed)
BP Readings from Last 3 Encounters:  06/22/14 128/80  05/11/14 180/80  03/12/14 160/80   Much better Still gets worked up about her mom in facility but doing better with the amlodipine Continue current care

## 2014-06-22 NOTE — Progress Notes (Signed)
Subjective:    Patient ID: Sheri [name], female    DOB: 02-07-1951, 63 y.o.   MRN: [id number]  HPI Feels okay No problems with the new med--tends to make her tired so taking it at night Was 168/80 at gyn 9/16  Still with stress Ceiling fell in---dealing with that Mom is worsening  No headaches No chest pain or SOB  Current Outpatient Prescriptions on File Prior to Visit  Medication Sig Dispense Refill  . amLODipine (NORVASC) 5 MG tablet Take 1 tablet (5 mg total) by mouth daily.  90 tablet  3  . aspirin 81 MG tablet Take 81 mg by mouth daily.      . cyclobenzaprine (FLEXERIL) 10 MG tablet TAKE 1/2-1 TABLET BY MOUTH EVERY NIGHT AT BEDTIME AS NEEDED FOR MUSCLE SPAMS  30 tablet  0  . fish oil-omega-3 fatty acids 1000 MG capsule Take 2 g by mouth daily.      . furosemide (LASIX) 40 MG tablet Take 1 tablet (40 mg total) by mouth daily.  30 tablet  11  . lisinopril (PRINIVIL,ZESTRIL) 40 MG tablet Take 1 tablet (40 mg total) by mouth daily.  90 tablet  3  . Multiple Vitamins-Minerals (MULTIVITAMIN WITH MINERALS) tablet Take 1 tablet by mouth daily.      Marland Kitchen omeprazole (PRILOSEC) 20 MG capsule TAKE ONE CAPSULE BY MOUTH DAILY  30 capsule  0  . tamsulosin (FLOMAX) 0.4 MG CAPS capsule Take 1 capsule (0.4 mg total) by mouth every evening.  30 capsule  11  . traMADol (ULTRAM) 50 MG tablet TAKE 1 TABLET BY MOUTH THREE TIMES DAILY AS NEEDED  90 tablet  0   No current facility-administered medications on file prior to visit.    Allergies  Allergen Reactions  . Codeine Nausea And Vomiting    Past Medical History  Diagnosis Date  . Wrist fracture, left 1/14  . Hypertension   . IBS (irritable bowel syndrome)   . Nocturia   . Allergy   . Personal history of colonic adenomas 10/09/2013  . GERD (gastroesophageal reflux disease)     Past Surgical History  Procedure Laterality Date  . Tubal ligation    . Abdominal hysterectomy    . Carpal tunnel release  1982    rt  . Tonsillectomy      . Dorsal compartment release Left 03/16/2013    Procedure: LEFT FIRST RELEASE DORSAL COMPARTMENT (DEQUERVAIN) EXCISION OF CYST  A1 PULLEY LEFT THUMB;  Surgeon: Cammie Sickle., MD;  Location: [facility];  Service: Orthopedics;  Laterality: Left;  . Carpal tunnel release Right 09/12/2013    Procedure: RIGHT CARPAL TUNNEL RELEASE;  Surgeon: Cammie Sickle., MD;  Location: [facility];  Service: Orthopedics;  Laterality: Right;    Family History  Problem Relation Age of Onset  . Colon cancer Neg Hx   . Esophageal cancer Neg Hx   . Rectal cancer Neg Hx   . Stomach cancer Neg Hx   . Heart disease Neg Hx   . Dementia Mother     Lewy body  . Hypertension Sister   . Cancer Sister     breast    History   Social History  . Marital Status: Divorced    Spouse Name: N/A    Number of Children: 2  . 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 Smoking  status: Former Smoker    Types: Cigarettes    Quit date: 10/13/2002  . Smokeless tobacco: Never Used  . Alcohol Use: No  . Drug Use: No  . Sexual Activity: No   Other Topics Concern  . 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 Married '73 - 18 yr/divorced. 1 dtr - '82, 1 son - '73; 1 grandchild.    Lives in her own place, daughter lives with her.    Mother with dementia and she is a responsible family member.    Review of Systems Sleeping okay  Appetite is okay also Puts up the hip pain--not really limiting her No edema     Objective:   Physical Exam  Constitutional: She appears well-developed and well-nourished. No distress.  Neck: Normal range of motion. Neck supple. No thyromegaly present.  Cardiovascular: Normal rate, regular rhythm and normal heart sounds.  Exam reveals no gallop.   No murmur heard. Pulmonary/Chest: Effort normal and breath sounds normal. No respiratory distress. She has  no wheezes. She has no rales.  Musculoskeletal: She exhibits no edema.  Lymphadenopathy:    She has no cervical adenopathy.  Psychiatric: She has a normal mood and affect. Her behavior is normal.          Assessment & Plan:

## 2014-06-22 NOTE — Progress Notes (Signed)
Pre visit review using our clinic review tool, if applicable. No additional management support is needed unless otherwise documented below in the visit note. 

## 2014-06-25 ENCOUNTER — Telehealth: Payer: Self-pay | Admitting: Internal Medicine

## 2014-06-25 NOTE — Telephone Encounter (Signed)
emmi emailed °

## 2014-06-28 ENCOUNTER — Other Ambulatory Visit: Payer: Self-pay | Admitting: Internal Medicine

## 2014-07-06 ENCOUNTER — Other Ambulatory Visit: Payer: Self-pay | Admitting: Internal Medicine

## 2014-07-06 NOTE — Telephone Encounter (Signed)
Okay #90 x 0 

## 2014-07-06 NOTE — Telephone Encounter (Signed)
Electronic Rx request for tramadol received. Patient last office visit was 06/22/14 and medication last filled 06/08/14. Please advise.

## 2014-07-06 NOTE — Telephone Encounter (Signed)
Rx called into pharmacy as prescribed  

## 2014-07-25 ENCOUNTER — Other Ambulatory Visit: Payer: Self-pay | Admitting: Internal Medicine

## 2014-08-03 ENCOUNTER — Other Ambulatory Visit: Payer: Self-pay | Admitting: Internal Medicine

## 2014-08-03 NOTE — Telephone Encounter (Signed)
Approved: #90 x 0 

## 2014-08-03 NOTE — Telephone Encounter (Signed)
rx called into pharmacy

## 2014-08-03 NOTE — Telephone Encounter (Signed)
okay

## 2014-08-03 NOTE — Telephone Encounter (Signed)
07/06/14 

## 2014-08-27 ENCOUNTER — Other Ambulatory Visit: Payer: Self-pay | Admitting: Internal Medicine

## 2014-08-31 ENCOUNTER — Other Ambulatory Visit: Payer: Self-pay | Admitting: Internal Medicine

## 2014-08-31 NOTE — Telephone Encounter (Signed)
08/03/14 

## 2014-08-31 NOTE — Telephone Encounter (Signed)
Approved: #90 x 0 

## 2014-08-31 NOTE — Telephone Encounter (Signed)
rx called into pharmacy

## 2014-09-28 ENCOUNTER — Other Ambulatory Visit: Payer: Self-pay | Admitting: Internal Medicine

## 2014-09-28 NOTE — Telephone Encounter (Signed)
rx called into pharmacy

## 2014-09-28 NOTE — Telephone Encounter (Signed)
08/31/14 

## 2014-09-28 NOTE — Telephone Encounter (Signed)
Approved: #90 x 0 

## 2014-10-02 ENCOUNTER — Other Ambulatory Visit: Payer: Self-pay | Admitting: Internal Medicine

## 2014-10-26 ENCOUNTER — Other Ambulatory Visit: Payer: Self-pay | Admitting: Internal Medicine

## 2014-10-26 NOTE — Telephone Encounter (Signed)
Rx called in to pharmacy. 

## 2014-10-26 NOTE — Telephone Encounter (Signed)
Okay #90 x 0 

## 2014-10-26 NOTE — Telephone Encounter (Signed)
Pt called and needs refill on traMADol (ULTRAM) 50 MG tablet [[id number]]  Call into [facility] [address] [facility].

## 2014-10-27 ENCOUNTER — Other Ambulatory Visit: Payer: Self-pay | Admitting: Internal Medicine

## 2014-10-29 NOTE — Telephone Encounter (Signed)
Spoke to pharmacist and was advised that patient picked up #90 on 10/28/14 and to disregard the request.

## 2014-10-29 NOTE — Telephone Encounter (Signed)
Last filled 09/28/14--please advise

## 2014-10-29 NOTE — Telephone Encounter (Signed)
Approved: this was just done 2/5 Confirm with pharmacy

## 2014-11-08 ENCOUNTER — Other Ambulatory Visit: Payer: Self-pay

## 2014-11-08 NOTE — Telephone Encounter (Signed)
Approved: #30 x 1 

## 2014-11-08 NOTE — Telephone Encounter (Signed)
Pt left v/m requesting refill on cyclobenzaprine to [facility] [address]. Pt requested from pharmacy on 11/02/14. Pt request cb.

## 2014-11-09 MED ORDER — CYCLOBENZAPRINE HCL 10 MG PO TABS: ORAL_TABLET | ORAL | Status: DC

## 2014-11-22 ENCOUNTER — Other Ambulatory Visit: Payer: Self-pay | Admitting: Internal Medicine

## 2014-11-22 NOTE — Telephone Encounter (Signed)
Tramadol called into [facility] per Dr Silvio Pate approval.

## 2014-11-22 NOTE — Telephone Encounter (Signed)
Last filled 09/28/2014, patient last seen 06/22/2014.  Please advise if tramadol refill is appropriate.

## 2014-11-22 NOTE — Telephone Encounter (Signed)
Approved: #90 x 0 

## 2014-12-21 ENCOUNTER — Other Ambulatory Visit: Payer: Self-pay | Admitting: Internal Medicine

## 2014-12-21 NOTE — Telephone Encounter (Signed)
Approved: #90 x 0 

## 2014-12-21 NOTE — Telephone Encounter (Signed)
11/22/14 

## 2014-12-21 NOTE — Telephone Encounter (Signed)
Tramadol called into [facility] per Dr Silvio Pate.

## 2014-12-28 ENCOUNTER — Ambulatory Visit (INDEPENDENT_AMBULATORY_CARE_PROVIDER_SITE_OTHER): Payer: BLUE CROSS/BLUE SHIELD | Admitting: Internal Medicine

## 2014-12-28 ENCOUNTER — Encounter: Payer: Self-pay | Admitting: Internal Medicine

## 2014-12-28 VITALS — BP 148/82 | HR 62 | Temp 97.6°F | Wt 176.0 lb

## 2014-12-28 DIAGNOSIS — I10 Essential (primary) hypertension: Secondary | ICD-10-CM

## 2014-12-28 DIAGNOSIS — E785 Hyperlipidemia, unspecified: Secondary | ICD-10-CM | POA: Diagnosis not present

## 2014-12-28 DIAGNOSIS — Z0000 Encounter for general adult medical examination without abnormal findings: Secondary | ICD-10-CM | POA: Diagnosis not present

## 2014-12-28 DIAGNOSIS — K219 Gastro-esophageal reflux disease without esophagitis: Secondary | ICD-10-CM | POA: Diagnosis not present

## 2014-12-28 DIAGNOSIS — Z23 Encounter for immunization: Secondary | ICD-10-CM

## 2014-12-28 LAB — COMPREHENSIVE METABOLIC PANEL
ALT: 14 U/L (ref 0–35)
AST: 15 U/L (ref 0–37)
Albumin: 4.3 g/dL (ref 3.5–5.2)
Alkaline Phosphatase: 87 U/L (ref 39–117)
BUN: 21 mg/dL (ref 6–23)
CALCIUM: 9.6 mg/dL (ref 8.4–10.5)
CHLORIDE: 105 meq/L (ref 96–112)
CO2: 27 meq/L (ref 19–32)
CREATININE: 1.08 mg/dL (ref 0.40–1.20)
GFR: 66.27 mL/min (ref >60.00)
Glucose, Bld: 102 mg/dL — ABNORMAL HIGH (ref 70–99)
POTASSIUM: 4.1 meq/L (ref 3.5–5.1)
Sodium: 139 mEq/L (ref 135–145)
Total Bilirubin: 0.3 mg/dL (ref 0.2–1.2)
Total Protein: 7.5 g/dL (ref 6.0–8.3)

## 2014-12-28 LAB — CBC WITH DIFFERENTIAL/PLATELET
Basophils Absolute: 0 10*3/uL (ref 0.0–0.1)
Basophils Relative: 0.5 % (ref 0.0–3.0)
EOS PCT: 2.9 % (ref 0.0–5.0)
Eosinophils Absolute: 0.2 10*3/uL (ref 0.0–0.7)
HCT: 34.1 % — ABNORMAL LOW (ref 36.0–46.0)
Hemoglobin: 11.4 g/dL — ABNORMAL LOW (ref 12.0–15.0)
Lymphocytes Relative: 32.8 % (ref 12.0–46.0)
Lymphs Abs: 1.8 10*3/uL (ref 0.7–4.0)
MCHC: 33.3 g/dL (ref 30.0–36.0)
MCV: 87.1 fl (ref 78.0–100.0)
MONOS PCT: 5.7 % (ref 3.0–12.0)
Monocytes Absolute: 0.3 10*3/uL (ref 0.1–1.0)
Neutro Abs: 3.2 10*3/uL (ref 1.4–7.7)
Neutrophils Relative %: 58.1 % (ref 43.0–77.0)
Platelets: 231 10*3/uL (ref 150.0–400.0)
RBC: 3.92 Mil/uL (ref 3.87–5.11)
RDW: 14.1 % (ref 11.5–15.5)
WBC: 5.5 10*3/uL (ref 4.0–10.5)

## 2014-12-28 LAB — LIPID PANEL
CHOLESTEROL: 236 mg/dL — AB (ref 0–200)
HDL: 67.1 mg/dL (ref >39.00)
LDL Cholesterol: 152 mg/dL — ABNORMAL HIGH (ref 0–99)
NonHDL: 168.9
Total CHOL/HDL Ratio: 4
Triglycerides: 85 mg/dL (ref 0.0–149.0)
VLDL: 17 mg/dL (ref 0.0–40.0)

## 2014-12-28 LAB — T4, FREE: Free T4: 0.69 ng/dL (ref 0.60–1.60)

## 2014-12-28 MED ORDER — AMLODIPINE BESYLATE 10 MG PO TABS: 10.0000 mg | ORAL_TABLET | Freq: Every day | ORAL | Status: DC

## 2014-12-28 MED ORDER — ZOSTER VACCINE LIVE 19400 UNT/0.65ML ~~LOC~~ SOLR: 0.6500 mL | Freq: Once | SUBCUTANEOUS | Status: DC

## 2014-12-28 NOTE — Assessment & Plan Note (Signed)
BP Readings from Last 3 Encounters:  12/28/14 148/82  06/22/14 128/80  05/11/14 180/80   Will increase the dose of amlodipine

## 2014-12-28 NOTE — Progress Notes (Signed)
Subjective:    Patient ID: Sheri [name], female    DOB: [birth year], 64 y.o.   MRN: [id number]  HPI Here for physical Still sees Dr Elias Else doesn't need to keep going since had hyster  Having pain in hands Some swelling in left wrist, right is not bad Pain in left radiates up to left shoulder and neck Icy hot patches help--when she puts it on left neck The neck pain is all day---but worse at night after using her hands all day at work Hurts on days off--but not as bad Some hand weakness Hasn't tried a brace again  Mom isn't doing well They are asking to move her-- may need skilled facility  Current Outpatient Prescriptions on File Prior to Visit  Medication Sig Dispense Refill  . amLODipine (NORVASC) 5 MG tablet Take 1 tablet (5 mg total) by mouth daily. 90 tablet 3  . aspirin 81 MG tablet Take 81 mg by mouth daily.    . cyclobenzaprine (FLEXERIL) 10 MG tablet TAKE 1/2 TO 1 TABLET BY MOUTH EVERY NIGHT AT BEDTIME AS NEEDED FOR MUSCLE SPASMS 30 tablet 1  . fish oil-omega-3 fatty acids 1000 MG capsule Take 2 g by mouth daily.    . furosemide (LASIX) 40 MG tablet Take 1 tablet (40 mg total) by mouth daily. 30 tablet 11  . lisinopril (PRINIVIL,ZESTRIL) 40 MG tablet Take 1 tablet (40 mg total) by mouth daily. 90 tablet 3  . Multiple Vitamins-Minerals (MULTIVITAMIN WITH MINERALS) tablet Take 1 tablet by mouth daily.    Marland Kitchen omeprazole (PRILOSEC) 20 MG capsule TAKE ONE CAPSULE BY MOUTH EVERY DAY 30 capsule 11  . tamsulosin (FLOMAX) 0.4 MG CAPS capsule Take 1 capsule (0.4 mg total) by mouth every evening. 30 capsule 11  . traMADol (ULTRAM) 50 MG tablet TAKE 1 TABLET BY MOUTH THREE TIMES DAILY AS NEEDED 90 tablet 0   No current facility-administered medications on file prior to visit.    Allergies  Allergen Reactions  . Codeine Nausea And Vomiting    Past Medical History  Diagnosis Date  . Wrist fracture, left 1/14  . Hypertension   . IBS (irritable bowel syndrome)   .  Nocturia   . Allergy   . Personal history of colonic adenomas 10/09/2013  . GERD (gastroesophageal reflux disease)     Past Surgical History  Procedure Laterality Date  . Tubal ligation    . Abdominal hysterectomy    . Carpal tunnel release  1982    rt  . Tonsillectomy    . Dorsal compartment release Left 03/16/2013    Procedure: LEFT FIRST RELEASE DORSAL COMPARTMENT (DEQUERVAIN) EXCISION OF CYST  A1 PULLEY LEFT THUMB;  Surgeon: Cammie Sickle., MD;  Location: [place];  Service: Orthopedics;  Laterality: Left;  . Carpal tunnel release Right 09/12/2013    Procedure: RIGHT CARPAL TUNNEL RELEASE;  Surgeon: Cammie Sickle., MD;  Location: [place];  Service: Orthopedics;  Laterality: Right;    Family History  Problem Relation Age of Onset  . Colon cancer Neg Hx   . Esophageal cancer Neg Hx   . Rectal cancer Neg Hx   . Stomach cancer Neg Hx   . Heart disease Neg Hx   . Dementia Mother     Lewy body  . Hypertension Sister   . Cancer Sister     breast    History   Social History  . Marital Status: Divorced    Spouse Name: N/A  .  Number of Children: 2  . 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 Smoking status: Former Smoker    Types: Cigarettes    Quit date: 10/13/2002  . Smokeless tobacco: Never Used  . Alcohol Use: No  . Drug Use: No  . Sexual Activity: No   Other Topics Concern  . 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 Married '73 - 18 yr/divorced. 1 dtr - '82, 1 son - '73; 1 grandchild.    Lives in her own place, daughter lives with her.    Mother with dementia and she is a responsible family member.    Review of Systems  Constitutional: Negative for fatigue and unexpected weight change.       Wears seat belt  HENT: Positive for tinnitus and trouble swallowing. Negative for dental problem and hearing loss.         Overdue for dentist  Eyes: Negative for visual disturbance.  Cardiovascular: Positive for chest pain.       Gets rare chest pain from gas or from her bra  Gastrointestinal: Negative for nausea, constipation and blood in stool.       Gas at times--can have some dysphagia with white bread  Endocrine: Positive for polydipsia. Negative for polyuria.  Genitourinary: Positive for urgency and frequency. Negative for dyspareunia.       Unclear if oxybutynin is helping  Musculoskeletal: Positive for back pain and arthralgias. Negative for joint swelling.  Skin: Negative for rash.       No suspicious lesions  Allergic/Immunologic: Positive for environmental allergies. Negative for immunocompromised state.       Sinus headache since cutting lawn  Neurological: Positive for weakness and headaches. Negative for dizziness and syncope.  Psychiatric/Behavioral: Positive for sleep disturbance. Negative for dysphoric mood. The patient is nervous/anxious.        Mild sleep problems Worries about her mom       Objective:   Physical Exam  Constitutional: She is oriented to person, place, and time. She appears well-developed and well-nourished. No distress.  HENT:  Head: Normocephalic and atraumatic.  Right Ear: External ear normal.  Left Ear: External ear normal.  Mouth/Throat: Oropharynx is clear and moist. No oropharyngeal exudate.  Eyes: Conjunctivae and EOM are normal. Pupils are equal, round, and reactive to light.  Neck: Normal range of motion. Neck supple. No thyromegaly present.  Cardiovascular: Normal rate, regular rhythm, normal heart sounds and intact distal pulses.  Exam reveals no gallop.   No murmur heard. Pulmonary/Chest: Effort normal and breath sounds normal. No respiratory distress. She has no wheezes. She has no rales.  Abdominal: Soft. There is no tenderness.  Musculoskeletal: She exhibits no edema.  Tightness and tenderness along left trapezius---this recreates her pain (discussed  heat/massage)  Lymphadenopathy:    She has no cervical adenopathy.  Neurological: She is alert and oriented to person, place, and time.  Skin: No rash noted. No erythema.  Psychiatric: She has a normal mood and affect. Her behavior is normal.          Assessment & Plan:

## 2014-12-28 NOTE — Progress Notes (Signed)
Pre visit review using our clinic review tool, if applicable. No additional management support is needed unless otherwise documented below in the visit note. 

## 2014-12-28 NOTE — Assessment & Plan Note (Signed)
Tdap today No Pap due to hyster UTD on mammo and colonoscopy

## 2014-12-28 NOTE — Addendum Note (Signed)
Addended by: Despina Hidden on: 12/28/2014 08:27 AM   Modules accepted: Orders

## 2014-12-28 NOTE — Assessment & Plan Note (Signed)
She is interested in primary prevention If LDL still over 100, will start atorvastatin

## 2014-12-28 NOTE — Addendum Note (Signed)
Addended by: Viviana Simpler I on: 12/28/2014 08:21 AM   Modules accepted: Orders

## 2014-12-28 NOTE — Assessment & Plan Note (Signed)
Generally okay but rare dysphagia Continue the PPI

## 2015-01-03 ENCOUNTER — Telehealth: Payer: Self-pay | Admitting: Internal Medicine

## 2015-01-03 ENCOUNTER — Encounter: Payer: Self-pay | Admitting: *Deleted

## 2015-01-03 MED ORDER — ATORVASTATIN CALCIUM 20 MG PO TABS: 20.0000 mg | ORAL_TABLET | Freq: Every day | ORAL | Status: DC

## 2015-01-03 NOTE — Telephone Encounter (Signed)
See lab note.  

## 2015-01-03 NOTE — Addendum Note (Signed)
Addended by: Despina Hidden on: 01/03/2015 04:32 PM   Modules accepted: Orders, Medications

## 2015-01-03 NOTE — Telephone Encounter (Signed)
Patient returned Dee's call.  Please call her back with lab results.

## 2015-01-06 ENCOUNTER — Other Ambulatory Visit: Payer: Self-pay | Admitting: Internal Medicine

## 2015-01-07 NOTE — Telephone Encounter (Signed)
Please refer to message below--please advise

## 2015-01-07 NOTE — Telephone Encounter (Signed)
Can we check with Dr. Silvio Pate, is this something he wants him to continue taking every night?

## 2015-01-07 NOTE — Telephone Encounter (Signed)
Last filled by you 10/2013 with 1 refill--please advise

## 2015-01-07 NOTE — Telephone Encounter (Signed)
Approved:okay #30 x 1

## 2015-01-15 ENCOUNTER — Other Ambulatory Visit: Payer: Self-pay | Admitting: Internal Medicine

## 2015-01-18 ENCOUNTER — Other Ambulatory Visit: Payer: Self-pay | Admitting: Internal Medicine

## 2015-01-18 NOTE — Telephone Encounter (Signed)
rx called into pharmacy

## 2015-01-18 NOTE — Telephone Encounter (Signed)
12/21/14 

## 2015-01-18 NOTE — Telephone Encounter (Signed)
Approved: #90 x 0 

## 2015-01-28 ENCOUNTER — Telehealth: Payer: Self-pay

## 2015-01-28 NOTE — Telephone Encounter (Signed)
Please call her She needs to restart the amlodipine at the lower dose--5mg  daily. She can cut the 10's in half and then send new Rx for the 5's again if she needs it (since she didn't have problems with swelling at that dose) Her BP was only mildly elevated--so we can hold off on a new med but she should come in within the next 2-3 months to recheck her BP (if she doesn't already have an appt)

## 2015-01-28 NOTE — Telephone Encounter (Signed)
Pt left v/m; 12/28/14 amlodipine was increased to 10 mg. 01/23/15 pt began to notice ankles and feet swelling and hurting. On 01/27/15 pt stopped amlodipine and request different med sent to [facility] Pt request cb.

## 2015-01-28 NOTE — Telephone Encounter (Signed)
.  left message to have patient return my call.  

## 2015-01-28 NOTE — Telephone Encounter (Signed)
Pt returned your call.  

## 2015-01-29 NOTE — Telephone Encounter (Signed)
Spoke with patient and advised results  She will call for an appt

## 2015-01-29 NOTE — Telephone Encounter (Signed)
.  left message to have patient return my call.  

## 2015-02-05 ENCOUNTER — Ambulatory Visit (INDEPENDENT_AMBULATORY_CARE_PROVIDER_SITE_OTHER): Payer: BLUE CROSS/BLUE SHIELD | Admitting: Internal Medicine

## 2015-02-05 ENCOUNTER — Encounter: Payer: Self-pay | Admitting: Internal Medicine

## 2015-02-05 VITALS — BP 158/80 | HR 72 | Temp 98.1°F | Wt 178.0 lb

## 2015-02-05 DIAGNOSIS — E785 Hyperlipidemia, unspecified: Secondary | ICD-10-CM

## 2015-02-05 DIAGNOSIS — M79671 Pain in right foot: Secondary | ICD-10-CM

## 2015-02-05 LAB — LIPID PANEL
CHOLESTEROL: 172 mg/dL (ref 0–200)
HDL: 64.1 mg/dL (ref >39.00)
LDL Cholesterol: 88 mg/dL (ref 0–99)
NonHDL: 107.9
TRIGLYCERIDES: 102 mg/dL (ref 0.0–149.0)
Total CHOL/HDL Ratio: 3
VLDL: 20.4 mg/dL (ref 0.0–40.0)

## 2015-02-05 LAB — HEPATIC FUNCTION PANEL
ALT: 19 U/L (ref 0–35)
AST: 23 U/L (ref 0–37)
Albumin: 4.2 g/dL (ref 3.5–5.2)
Alkaline Phosphatase: 89 U/L (ref 39–117)
Bilirubin, Direct: 0.1 mg/dL (ref 0.0–0.3)
TOTAL PROTEIN: 7.1 g/dL (ref 6.0–8.3)
Total Bilirubin: 0.2 mg/dL (ref 0.2–1.2)

## 2015-02-05 LAB — URIC ACID: URIC ACID, SERUM: 4.4 mg/dL (ref 2.4–7.0)

## 2015-02-05 MED ORDER — COLCHICINE 0.6 MG PO TABS: 0.6000 mg | ORAL_TABLET | Freq: Two times a day (BID) | ORAL | Status: DC | PRN

## 2015-02-05 NOTE — Progress Notes (Signed)
Pre visit review using our clinic review tool, if applicable. No additional management support is needed unless otherwise documented below in the visit note. 

## 2015-02-05 NOTE — Progress Notes (Signed)
Subjective:    Patient ID: Sheri [name], female    DOB: 11/21/1950, 64 y.o.   MRN: [id number]  HPI Here for right foot pain Started after increasing the amlodipine  Went back to the 5mg  but still swollen and sore Tingling and tight feeling Some throbbing--around front and lateral part of ankle  New flat shoes--but wears tennis shoes for her prolonged standing at work  Tried soaking in epsom salts-- no help Hot rubs no help  Current Outpatient Prescriptions on File Prior to Visit  Medication Sig Dispense Refill  . aspirin 81 MG tablet Take 81 mg by mouth daily.    Marland Kitchen atorvastatin (LIPITOR) 20 MG tablet Take 1 tablet (20 mg total) by mouth daily. 90 tablet 3  . cyclobenzaprine (FLEXERIL) 10 MG tablet TAKE 1/2 TO 1 TABLET BY MOUTH EVERY NIGHT AT BEDTIME AS NEEDED FOR MUSCLE SPASMS 30 tablet 1  . fish oil-omega-3 fatty acids 1000 MG capsule Take 2 g by mouth daily.    . furosemide (LASIX) 40 MG tablet Take 1 tablet (40 mg total) by mouth daily. 30 tablet 11  . lisinopril (PRINIVIL,ZESTRIL) 40 MG tablet TAKE 1 TABLET BY MOUTH DAILY 90 tablet 0  . Multiple Vitamins-Minerals (MULTIVITAMIN WITH MINERALS) tablet Take 1 tablet by mouth daily.    Marland Kitchen omeprazole (PRILOSEC) 20 MG capsule TAKE ONE CAPSULE BY MOUTH EVERY DAY 30 capsule 11  . oxybutynin (DITROPAN-XL) 10 MG 24 hr tablet Take 10 mg by mouth at bedtime.   12  . tamsulosin (FLOMAX) 0.4 MG CAPS capsule Take 1 capsule (0.4 mg total) by mouth every evening. 30 capsule 11  . traMADol (ULTRAM) 50 MG tablet TAKE 1 TABLET BY MOUTH THREE TIMES DAILY AS NEEDED 90 tablet 0  . zoster vaccine live, PF, (ZOSTAVAX) 60630 UNT/0.65ML injection Inject 19,400 Units into the skin once. 1 each 0   No current facility-administered medications on file prior to visit.    Allergies  Allergen Reactions  . Codeine Nausea And Vomiting    Past Medical History  Diagnosis Date  . Wrist fracture, left 1/14  . Hypertension   . IBS (irritable bowel syndrome)    . Nocturia   . Allergy   . Personal history of colonic adenomas 10/09/2013  . GERD (gastroesophageal reflux disease)     Past Surgical History  Procedure Laterality Date  . Tubal ligation    . Abdominal hysterectomy    . Carpal tunnel release  1982    rt  . Tonsillectomy    . Dorsal compartment release Left 03/16/2013    Procedure: LEFT FIRST RELEASE DORSAL COMPARTMENT (DEQUERVAIN) EXCISION OF CYST  A1 PULLEY LEFT THUMB;  Surgeon: Cammie Sickle., MD;  Location: [place];  Service: Orthopedics;  Laterality: Left;  . Carpal tunnel release Right 09/12/2013    Procedure: RIGHT CARPAL TUNNEL RELEASE;  Surgeon: Cammie Sickle., MD;  Location: [place];  Service: Orthopedics;  Laterality: Right;    Family History  Problem Relation Age of Onset  . Colon cancer Neg Hx   . Esophageal cancer Neg Hx   . Rectal cancer Neg Hx   . Stomach cancer Neg Hx   . Heart disease Neg Hx   . Dementia Mother     Lewy body  . Hypertension Sister   . Cancer Sister     breast    History   Social History  . Marital Status: Divorced    Spouse Name: N/A  .  Number of Children: 2  . 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 Smoking status: Former Smoker    Types: Cigarettes    Quit date: 10/13/2002  . Smokeless tobacco: Never Used  . Alcohol Use: No  . Drug Use: No  . Sexual Activity: No   Other Topics Concern  . 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 Married '73 - 18 yr/divorced. 1 dtr - '82, 1 son - '73; 1 grandchild.    Lives in her own place, daughter lives with her.    Mother with dementia and she is a responsible family member.    Review of Systems  No fever No other joint problems     Objective:   Physical Exam  Musculoskeletal:  Mild swelling at right ankle No redness Exquisite tenderness at front of right ankle  (tibio-talar) Referred pain to that spot with palpation in other areas          Assessment & Plan:

## 2015-02-05 NOTE — Patient Instructions (Signed)
Please try the colchicine. You can take 2 a day---within 2 hours or so. If your foot isn't better by tomorrow, I can make a referral to an orthopedist.

## 2015-02-05 NOTE — Assessment & Plan Note (Signed)
The severity of the pain and localized tenderness is suspicious for gout Sister has it so told her that could be possible Can't tolerate NSAIDs Will try colchicine and check level If no better, ortho referral

## 2015-02-15 ENCOUNTER — Other Ambulatory Visit: Payer: Self-pay | Admitting: Internal Medicine

## 2015-02-15 NOTE — Telephone Encounter (Signed)
Will refill for it to be filled 02/17/15. If he takes it as prescribed, he should not be out

## 2015-02-15 NOTE — Telephone Encounter (Signed)
Pt left v/m requesting status of tramadol refill requested by [facility] [address]; pt is out of med and request refill to be done today. rx last filled 01/18/15 #90. Pt last seen 02/05/15. Dr Silvio Pate is out of office.Please advise.

## 2015-02-15 NOTE — Telephone Encounter (Signed)
Rx called in as instructed

## 2015-03-15 ENCOUNTER — Other Ambulatory Visit: Payer: Self-pay | Admitting: Internal Medicine

## 2015-03-15 NOTE — Telephone Encounter (Signed)
rx called into pharmacy

## 2015-03-15 NOTE — Telephone Encounter (Signed)
Last filled 02/15/2015 #90--- please advise

## 2015-03-15 NOTE — Telephone Encounter (Signed)
Approved: okay #90 x 0 

## 2015-04-12 ENCOUNTER — Other Ambulatory Visit: Payer: Self-pay | Admitting: Internal Medicine

## 2015-04-12 NOTE — Telephone Encounter (Signed)
Approved: okay #90 x 0 

## 2015-04-12 NOTE — Telephone Encounter (Signed)
03/15/2015 

## 2015-04-12 NOTE — Telephone Encounter (Signed)
rx called into pharmacy

## 2015-04-15 ENCOUNTER — Other Ambulatory Visit: Payer: Self-pay | Admitting: Internal Medicine

## 2015-05-10 ENCOUNTER — Other Ambulatory Visit: Payer: Self-pay | Admitting: Internal Medicine

## 2015-05-10 NOTE — Telephone Encounter (Signed)
04/12/2015 

## 2015-05-10 NOTE — Telephone Encounter (Signed)
Approved: okay #90 x 0 

## 2015-05-10 NOTE — Telephone Encounter (Signed)
rx called into pharmacy

## 2015-05-17 ENCOUNTER — Other Ambulatory Visit: Payer: Self-pay | Admitting: Internal Medicine

## 2015-06-04 ENCOUNTER — Other Ambulatory Visit: Payer: Self-pay | Admitting: Internal Medicine

## 2015-06-07 ENCOUNTER — Other Ambulatory Visit: Payer: Self-pay | Admitting: Internal Medicine

## 2015-06-07 NOTE — Telephone Encounter (Signed)
05/10/2015 

## 2015-06-07 NOTE — Telephone Encounter (Signed)
Approved: okay #90 x 0 May be due for a follow up soon--it has been 6 months (check my last note)

## 2015-06-07 NOTE — Telephone Encounter (Signed)
rx called into pharmacy

## 2015-06-17 ENCOUNTER — Other Ambulatory Visit: Payer: Self-pay | Admitting: Internal Medicine

## 2015-06-24 ENCOUNTER — Other Ambulatory Visit: Payer: Self-pay | Admitting: Obstetrics and Gynecology

## 2015-06-24 LAB — HM MAMMOGRAPHY

## 2015-06-25 LAB — CYTOLOGY - PAP

## 2015-06-29 ENCOUNTER — Other Ambulatory Visit: Payer: Self-pay | Admitting: Internal Medicine

## 2015-07-05 ENCOUNTER — Other Ambulatory Visit: Payer: Self-pay | Admitting: Internal Medicine

## 2015-07-05 NOTE — Telephone Encounter (Signed)
rx called into pharmacy

## 2015-07-05 NOTE — Telephone Encounter (Signed)
06/07/2015 

## 2015-07-05 NOTE — Telephone Encounter (Signed)
Approved: #90 x 0 

## 2015-07-23 ENCOUNTER — Encounter: Payer: Self-pay | Admitting: Internal Medicine

## 2015-07-24 ENCOUNTER — Other Ambulatory Visit: Payer: Self-pay | Admitting: Internal Medicine

## 2015-08-02 ENCOUNTER — Other Ambulatory Visit: Payer: Self-pay | Admitting: Internal Medicine

## 2015-08-02 NOTE — Telephone Encounter (Signed)
Approved: #90 x 0 

## 2015-08-02 NOTE — Telephone Encounter (Signed)
07/05/15 

## 2015-08-02 NOTE — Telephone Encounter (Signed)
rx called into pharmacy

## 2015-08-03 ENCOUNTER — Other Ambulatory Visit: Payer: Self-pay | Admitting: Internal Medicine

## 2015-08-29 ENCOUNTER — Other Ambulatory Visit: Payer: Self-pay | Admitting: Internal Medicine

## 2015-08-29 NOTE — Telephone Encounter (Signed)
rx called into pharmacy

## 2015-08-29 NOTE — Telephone Encounter (Signed)
08/02/2015 

## 2015-08-29 NOTE — Telephone Encounter (Signed)
Approved: #90 x 0 

## 2015-08-30 ENCOUNTER — Other Ambulatory Visit: Payer: Self-pay | Admitting: Internal Medicine

## 2015-09-27 ENCOUNTER — Other Ambulatory Visit: Payer: Self-pay | Admitting: Internal Medicine

## 2015-09-27 NOTE — Telephone Encounter (Signed)
rx called into pharmacy

## 2015-09-27 NOTE — Telephone Encounter (Signed)
Last filled 08/29/2015 Sheri [name], Please send back to me for call in

## 2015-09-27 NOTE — Telephone Encounter (Signed)
plz phone in. 

## 2015-10-03 ENCOUNTER — Other Ambulatory Visit: Payer: Self-pay | Admitting: Internal Medicine

## 2015-10-25 ENCOUNTER — Other Ambulatory Visit: Payer: Self-pay | Admitting: Family Medicine

## 2015-10-25 NOTE — Telephone Encounter (Signed)
Approved: #90 x 0 

## 2015-10-25 NOTE — Telephone Encounter (Signed)
rx called into pharmacy

## 2015-10-25 NOTE — Telephone Encounter (Deleted)
plz phoen in. 

## 2015-10-30 ENCOUNTER — Telehealth: Payer: Self-pay

## 2015-10-30 NOTE — Telephone Encounter (Signed)
Pt called back and pt has some incontinence of bowel and bloated feeling and pt scheduled appt with Dr Silvio Pate 10/31/15 at 4:15 pm. If pt condition worsens prior to appt pt will cb otherwise will keep appt on 10/31/15.

## 2015-10-30 NOTE — Telephone Encounter (Signed)
Pt left v/m;years ago pt was treated for IBS by Dr Veverly Fells and wants to know if could get Dicyclomine for IBS; last filled in 2015; for last 3 weeks pt has felt bloated and when goes to bathroom there is feces in panties. [place]. Pt last annual exam 12/28/14. Left v/m for pt to cb.

## 2015-10-31 ENCOUNTER — Ambulatory Visit (INDEPENDENT_AMBULATORY_CARE_PROVIDER_SITE_OTHER): Payer: BLUE CROSS/BLUE SHIELD | Admitting: Internal Medicine

## 2015-10-31 ENCOUNTER — Encounter: Payer: Self-pay | Admitting: Internal Medicine

## 2015-10-31 VITALS — BP 160/80 | HR 73 | Temp 98.4°F | Wt 175.0 lb

## 2015-10-31 DIAGNOSIS — R140 Abdominal distension (gaseous): Secondary | ICD-10-CM | POA: Diagnosis not present

## 2015-10-31 MED ORDER — DICYCLOMINE HCL 10 MG PO CAPS: 10.0000 mg | ORAL_CAPSULE | Freq: Three times a day (TID) | ORAL | Status: DC | PRN

## 2015-10-31 NOTE — Assessment & Plan Note (Signed)
Symptoms are long standing but never had the fecal incontinence before Discussed and info given on Low FODMAP diet Switch yogurt to [place] Rx for dicyclomine If no improvement, to GI

## 2015-10-31 NOTE — Patient Instructions (Signed)
Please change your yogurt to [place]. Try to follow a low FODMAP diet If your symptoms are not improving in the next 1-2 months---I will get you set up with a GI doctor

## 2015-10-31 NOTE — Progress Notes (Signed)
Subjective:    Patient ID: Sheri [name], female    DOB: March 10, 1951, 65 y.o.   MRN: [id number]  HPI Here due to bowel problems  Got dicyclomine years ago due to bloating and then rectal urgency and loose bowels (will go when she thought she just had to void) Gets some burning at rectum Will have some feces on her pad-- incontinence  Bowels are fairly regular--- usually bid No blood  chronic abdominal discomfort  Tried cinnamon for a while Didn't seem to help the bloating  Current Outpatient Prescriptions on File Prior to Visit  Medication Sig Dispense Refill  . amLODipine (NORVASC) 5 MG tablet TAKE 1 TABLET BY MOUTH DAILY 90 tablet 3  . aspirin 81 MG tablet Take 81 mg by mouth daily.    Marland Kitchen atorvastatin (LIPITOR) 20 MG tablet Take 1 tablet (20 mg total) by mouth daily. 90 tablet 3  . cyclobenzaprine (FLEXERIL) 10 MG tablet TAKE 1/2 TO 1 TABLET BY MOUTH EVERY NIGHT AT BEDTIME AS NEEDED FOR MUSCLE SPASMS 30 tablet 0  . fish oil-omega-3 fatty acids 1000 MG capsule Take 2 g by mouth daily.    . furosemide (LASIX) 40 MG tablet Take 1 tablet (40 mg total) by mouth daily. 30 tablet 11  . lisinopril (PRINIVIL,ZESTRIL) 40 MG tablet TAKE 1 TABLET BY MOUTH DAILY 90 tablet 3  . Multiple Vitamins-Minerals (MULTIVITAMIN WITH MINERALS) tablet Take 1 tablet by mouth daily.    Marland Kitchen omeprazole (PRILOSEC) 20 MG capsule TAKE ONE CAPSULE BY MOUTH EVERY DAY 30 capsule 11  . oxybutynin (DITROPAN-XL) 10 MG 24 hr tablet Take 10 mg by mouth at bedtime.   12  . tamsulosin (FLOMAX) 0.4 MG CAPS capsule Take 1 capsule (0.4 mg total) by mouth every evening. 30 capsule 11  . traMADol (ULTRAM) 50 MG tablet TAKE 1 TABLET BY MOUTH THREE TIMES DAILY AS NEEDED 90 tablet 0   No current facility-administered medications on file prior to visit.    Allergies  Allergen Reactions  . Codeine Nausea And Vomiting    Past Medical History  Diagnosis Date  . Wrist fracture, left 1/14  . Hypertension   . IBS (irritable  bowel syndrome)   . Nocturia   . Allergy   . Personal history of colonic adenomas 10/09/2013  . GERD (gastroesophageal reflux disease)     Past Surgical History  Procedure Laterality Date  . Tubal ligation    . Abdominal hysterectomy    . Carpal tunnel release  1982    rt  . Tonsillectomy    . Dorsal compartment release Left 03/16/2013    Procedure: LEFT FIRST RELEASE DORSAL COMPARTMENT (DEQUERVAIN) EXCISION OF CYST  A1 PULLEY LEFT THUMB;  Surgeon: Cammie Sickle., MD;  Location: [place];  Service: Orthopedics;  Laterality: Left;  . Carpal tunnel release Right 09/12/2013    Procedure: RIGHT CARPAL TUNNEL RELEASE;  Surgeon: Cammie Sickle., MD;  Location: [place];  Service: Orthopedics;  Laterality: Right;    Family History  Problem Relation Age of Onset  . Colon cancer Neg Hx   . Esophageal cancer Neg Hx   . Rectal cancer Neg Hx   . Stomach cancer Neg Hx   . Heart disease Neg Hx   . Dementia Mother     Lewy body  . Hypertension Sister   . Cancer Sister     breast    Social History   Social History  . Marital Status: Divorced  Spouse Name: N/A  . Number of Children: 2  . 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 Smoking status: Former Smoker    Types: Cigarettes    Quit date: 10/13/2002  . Smokeless tobacco: Never Used  . Alcohol Use: No  . Drug Use: No  . Sexual Activity: No   Other Topics Concern  . 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 Married '73 - 18 yr/divorced. 1 dtr - '82, 1 son - '73; 1 grandchild.    Lives in her own place, daughter lives with her.    Mother with dementia and she is a responsible family member.    Review of Systems Appetite is okay--tries to eat healthy. Lots of salads Fiber bars will set her off Lots of yogurt. Not much cheese. Only a little milk--usually lactaid No sugarless  candies, etc Fell and injured right hand---having ongoing pain with this. Might need surgery    Objective:   Physical Exam  Pulmonary/Chest: Effort normal and breath sounds normal. No respiratory distress. She has no wheezes. She has no rales.  Abdominal: Soft. Bowel sounds are normal. She exhibits no mass. There is no rebound and no guarding.  Mild distention Mild generalized tenderness--mostly LLQ though          Assessment & Plan:

## 2015-10-31 NOTE — Telephone Encounter (Signed)
We will review this at the [facility]

## 2015-10-31 NOTE — Progress Notes (Signed)
Pre visit review using our clinic review tool, if applicable. No additional management support is needed unless otherwise documented below in the visit note. 

## 2015-11-03 ENCOUNTER — Other Ambulatory Visit: Payer: Self-pay | Admitting: Internal Medicine

## 2015-11-25 ENCOUNTER — Other Ambulatory Visit: Payer: Self-pay | Admitting: Internal Medicine

## 2015-11-25 NOTE — Telephone Encounter (Signed)
Refill left on voice mail at pharmacy

## 2015-11-25 NOTE — Telephone Encounter (Signed)
Approved: #90 x 0 

## 2015-11-25 NOTE — Telephone Encounter (Signed)
Last filled 10-25-15 #90/0 Last OV 10-31-15 Next OV 01-09-16

## 2015-12-05 ENCOUNTER — Other Ambulatory Visit: Payer: Self-pay | Admitting: Internal Medicine

## 2015-12-24 ENCOUNTER — Other Ambulatory Visit: Payer: Self-pay | Admitting: Internal Medicine

## 2015-12-24 NOTE — Telephone Encounter (Signed)
Approved: #90 x 0 

## 2015-12-24 NOTE — Telephone Encounter (Signed)
Last refill 11-25-15 #90/0 Last OV 10-31-15 Next OV 01-09-16

## 2015-12-24 NOTE — Telephone Encounter (Signed)
Left refill on voice mail at pharmacy  

## 2016-01-03 ENCOUNTER — Encounter: Payer: BLUE CROSS/BLUE SHIELD | Admitting: Internal Medicine

## 2016-01-09 ENCOUNTER — Encounter: Payer: Self-pay | Admitting: Internal Medicine

## 2016-01-09 ENCOUNTER — Ambulatory Visit (INDEPENDENT_AMBULATORY_CARE_PROVIDER_SITE_OTHER): Payer: BLUE CROSS/BLUE SHIELD | Admitting: Internal Medicine

## 2016-01-09 VITALS — BP 122/70 | HR 70 | Temp 97.5°F | Ht 67.0 in | Wt 181.0 lb

## 2016-01-09 DIAGNOSIS — K2100 Gastro-esophageal reflux disease with esophagitis, without bleeding: Secondary | ICD-10-CM

## 2016-01-09 DIAGNOSIS — E785 Hyperlipidemia, unspecified: Secondary | ICD-10-CM | POA: Diagnosis not present

## 2016-01-09 DIAGNOSIS — Z0000 Encounter for general adult medical examination without abnormal findings: Secondary | ICD-10-CM

## 2016-01-09 DIAGNOSIS — I10 Essential (primary) hypertension: Secondary | ICD-10-CM

## 2016-01-09 LAB — COMPREHENSIVE METABOLIC PANEL
ALBUMIN: 4.4 g/dL (ref 3.5–5.2)
ALT: 14 U/L (ref 0–35)
AST: 13 U/L (ref 0–37)
Alkaline Phosphatase: 83 U/L (ref 39–117)
BUN: 24 mg/dL — ABNORMAL HIGH (ref 6–23)
CALCIUM: 9.8 mg/dL (ref 8.4–10.5)
CHLORIDE: 101 meq/L (ref 96–112)
CO2: 30 meq/L (ref 19–32)
CREATININE: 1.28 mg/dL — AB (ref 0.40–1.20)
GFR: 54.29 mL/min — ABNORMAL LOW (ref >60.00)
Glucose, Bld: 110 mg/dL — ABNORMAL HIGH (ref 70–99)
POTASSIUM: 4.6 meq/L (ref 3.5–5.1)
Sodium: 138 mEq/L (ref 135–145)
Total Bilirubin: 0.3 mg/dL (ref 0.2–1.2)
Total Protein: 7.6 g/dL (ref 6.0–8.3)

## 2016-01-09 LAB — CBC WITH DIFFERENTIAL/PLATELET
BASOS PCT: 0.3 % (ref 0.0–3.0)
Basophils Absolute: 0 10*3/uL (ref 0.0–0.1)
EOS PCT: 5.5 % — AB (ref 0.0–5.0)
Eosinophils Absolute: 0.3 10*3/uL (ref 0.0–0.7)
HEMATOCRIT: 35.8 % — AB (ref 36.0–46.0)
HEMOGLOBIN: 11.7 g/dL — AB (ref 12.0–15.0)
LYMPHS PCT: 43.2 % (ref 12.0–46.0)
Lymphs Abs: 2.5 10*3/uL (ref 0.7–4.0)
MCHC: 32.6 g/dL (ref 30.0–36.0)
MCV: 88.1 fl (ref 78.0–100.0)
MONOS PCT: 6.5 % (ref 3.0–12.0)
Monocytes Absolute: 0.4 10*3/uL (ref 0.1–1.0)
NEUTROS ABS: 2.6 10*3/uL (ref 1.4–7.7)
Neutrophils Relative %: 44.5 % (ref 43.0–77.0)
PLATELETS: 238 10*3/uL (ref 150.0–400.0)
RBC: 4.06 Mil/uL (ref 3.87–5.11)
RDW: 14.2 % (ref 11.5–15.5)
WBC: 5.8 10*3/uL (ref 4.0–10.5)

## 2016-01-09 LAB — LIPID PANEL
CHOL/HDL RATIO: 2
Cholesterol: 178 mg/dL (ref 0–200)
HDL: 74.6 mg/dL (ref >39.00)
LDL Cholesterol: 87 mg/dL (ref 0–99)
NONHDL: 103.87
TRIGLYCERIDES: 83 mg/dL (ref 0.0–149.0)
VLDL: 16.6 mg/dL (ref 0.0–40.0)

## 2016-01-09 LAB — T4, FREE: Free T4: 0.78 ng/dL (ref 0.60–1.60)

## 2016-01-09 MED ORDER — OMEPRAZOLE 20 MG PO CPDR: 20.0000 mg | DELAYED_RELEASE_CAPSULE | Freq: Two times a day (BID) | ORAL | Status: DC

## 2016-01-09 MED ORDER — CYCLOBENZAPRINE HCL 10 MG PO TABS: ORAL_TABLET | ORAL | Status: DC

## 2016-01-09 NOTE — Progress Notes (Signed)
Pre visit review using our clinic review tool, if applicable. No additional management support is needed unless otherwise documented below in the visit note. 

## 2016-01-09 NOTE — Assessment & Plan Note (Signed)
Worse symptoms and clear LPR symptoms (cough and in throat) Will increase to bid

## 2016-01-09 NOTE — Assessment & Plan Note (Signed)
Colon due next January Just had mammo in October Still getting paps but doesn't need Yearly flu vaccine

## 2016-01-09 NOTE — Assessment & Plan Note (Signed)
BP Readings from Last 3 Encounters:  01/09/16 122/70  10/31/15 160/80  02/05/15 158/80   Good control

## 2016-01-09 NOTE — Assessment & Plan Note (Signed)
Doing well with the statin 

## 2016-01-09 NOTE — Progress Notes (Signed)
Subjective:    Patient ID: Sheri [name], female    DOB: 1951-09-02, 65 y.o.   MRN: [id number]  HPI Here for physical  She feels her stomach has settled down Uses dicyclomine rarely No problems with statin--not related to GI issues  Still sees Dr Katrinka Blazing October Did mammogram then with 3D and it was fine  Mom in skilled facility at [facility] Not great--but they visit daily  Current Outpatient Prescriptions on File Prior to Visit  Medication Sig Dispense Refill  . amLODipine (NORVASC) 5 MG tablet TAKE 1 TABLET BY MOUTH DAILY 90 tablet 3  . aspirin 81 MG tablet Take 81 mg by mouth daily.    Marland Kitchen atorvastatin (LIPITOR) 20 MG tablet Take 1 tablet (20 mg total) by mouth daily. 90 tablet 3  . cyclobenzaprine (FLEXERIL) 10 MG tablet TAKE 1/2 TO 1 TABLET BY MOUTH EVERY NIGHT AT BEDTIME AS NEEDED FOR MUSCLE SPASMS 30 tablet 0  . dicyclomine (BENTYL) 10 MG capsule Take 1 capsule (10 mg total) by mouth 3 (three) times daily as needed for spasms. 90 capsule 1  . fish oil-omega-3 fatty acids 1000 MG capsule Take 2 g by mouth daily.    . furosemide (LASIX) 40 MG tablet Take 1 tablet (40 mg total) by mouth daily. 30 tablet 11  . lisinopril (PRINIVIL,ZESTRIL) 40 MG tablet TAKE 1 TABLET BY MOUTH DAILY 90 tablet 3  . Multiple Vitamins-Minerals (MULTIVITAMIN WITH MINERALS) tablet Take 1 tablet by mouth daily.    Marland Kitchen omeprazole (PRILOSEC) 20 MG capsule TAKE ONE CAPSULE BY MOUTH EVERY DAY 30 capsule 11  . oxybutynin (DITROPAN-XL) 10 MG 24 hr tablet Take 10 mg by mouth at bedtime.   12  . tamsulosin (FLOMAX) 0.4 MG CAPS capsule Take 1 capsule (0.4 mg total) by mouth every evening. 30 capsule 11  . traMADol (ULTRAM) 50 MG tablet TAKE 1 TABLET BY MOUTH THREE TIMES DAILY AS NEEDED 90 tablet 0   No current facility-administered medications on file prior to visit.    Allergies  Allergen Reactions  . Codeine Nausea And Vomiting    Past Medical History  Diagnosis Date  . Wrist fracture, left 1/14    . Hypertension   . IBS (irritable bowel syndrome)   . Nocturia   . Allergy   . Personal history of colonic adenomas 10/09/2013  . GERD (gastroesophageal reflux disease)     Past Surgical History  Procedure Laterality Date  . Tubal ligation    . Abdominal hysterectomy    . Carpal tunnel release  1982    rt  . Tonsillectomy    . Dorsal compartment release Left 03/16/2013    Procedure: LEFT FIRST RELEASE DORSAL COMPARTMENT (DEQUERVAIN) EXCISION OF CYST  A1 PULLEY LEFT THUMB;  Surgeon: Cammie Sickle., MD;  Location: [place];  Service: Orthopedics;  Laterality: Left;  . Carpal tunnel release Right 09/12/2013    Procedure: RIGHT CARPAL TUNNEL RELEASE;  Surgeon: Cammie Sickle., MD;  Location: [place];  Service: Orthopedics;  Laterality: Right;    Family History  Problem Relation Age of Onset  . Colon cancer Neg Hx   . Esophageal cancer Neg Hx   . Rectal cancer Neg Hx   . Stomach cancer Neg Hx   . Heart disease Neg Hx   . Dementia Mother     Lewy body  . Hypertension Sister   . Cancer Sister     breast    Social History  Social History  . Marital Status: Divorced    Spouse Name: N/A  . Number of Children: 2  . 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 Smoking status: Former Smoker    Types: Cigarettes    Quit date: 10/13/2002  . Smokeless tobacco: Never Used  . Alcohol Use: No  . Drug Use: No  . Sexual Activity: No   Other Topics Concern  . 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 Married '73 - 18 yr/divorced. 1 dtr - '82, 1 son - '73; 1 grandchild.    Lives in her own place, daughter lives with her.    Mother with dementia and she is a responsible family member.    Review of Systems  Constitutional: Negative for fatigue and unexpected weight change.       Wears seat belt  HENT: Positive for tinnitus and trouble  swallowing. Negative for dental problem and hearing loss.        No dentist in years--only a few on top and bottom  Eyes: Negative for visual disturbance.       No diplopia or unilateral vision loss  Respiratory: Positive for cough. Negative for chest tightness and shortness of breath.        Some cough related to reflux  Cardiovascular: Positive for palpitations. Negative for chest pain and leg swelling.  Gastrointestinal: Negative for nausea, abdominal pain, constipation and blood in stool.       Occ heartburn--mostly controlled Some trouble swallowing bread, biscuits, french fries  Endocrine: Negative for polydipsia and polyuria.  Genitourinary: Positive for difficulty urinating. Negative for dysuria and hematuria.       Doesn't void fully without pushing No sex--no problems  Musculoskeletal: Positive for arthralgias. Negative for myalgias and back pain.       Tendon repair recently on right wrist Some arthritis there also Knee pain also  Skin: Negative for rash.       No suspicious lesions  Allergic/Immunologic: Positive for environmental allergies. Negative for immunocompromised state.       Mild symptoms this year  Neurological: Negative for dizziness, syncope, weakness, light-headedness and headaches.  Hematological: Negative for adenopathy. Does not bruise/bleed easily.  Psychiatric/Behavioral: Positive for sleep disturbance and dysphoric mood. The patient is not nervous/anxious.        Occ mood issues---overwhelming schedule/mom in SNF, etc       Objective:   Physical Exam  Constitutional: She is oriented to person, place, and time. She appears well-developed and well-nourished. No distress.  HENT:  Head: Normocephalic and atraumatic.  Right Ear: External ear normal.  Left Ear: External ear normal.  Mouth/Throat: Oropharynx is clear and moist. No oropharyngeal exudate.  Eyes: Conjunctivae are normal. Pupils are equal, round, and reactive to light.  Neck: Normal range of  motion. Neck supple. No thyromegaly present.  Cardiovascular: Normal rate, regular rhythm, normal heart sounds and intact distal pulses.  Exam reveals no gallop.   No murmur heard. Pulmonary/Chest: Effort normal and breath sounds normal. No respiratory distress. She has no wheezes. She has no rales.  Abdominal: Soft. There is no tenderness.  Musculoskeletal: She exhibits no edema or tenderness.  Lymphadenopathy:    She has no cervical adenopathy.  Neurological: She is alert and oriented to person, place, and time.  Skin: No rash noted. No erythema.  Psychiatric: She has a normal  mood and affect. Her behavior is normal.          Assessment & Plan:

## 2016-01-16 ENCOUNTER — Encounter: Payer: Self-pay | Admitting: Internal Medicine

## 2016-01-21 ENCOUNTER — Other Ambulatory Visit: Payer: Self-pay | Admitting: Internal Medicine

## 2016-01-21 NOTE — Telephone Encounter (Signed)
Approved: #90 x 0 

## 2016-01-21 NOTE — Telephone Encounter (Signed)
Left refill on voice mail at pharmacy  

## 2016-01-21 NOTE — Telephone Encounter (Signed)
Last filled 12-24-15 #90/0 Last OV 01-09-16 Next OV 01-11-17

## 2016-01-23 DIAGNOSIS — S63591S Other specified sprain of right wrist, sequela: Secondary | ICD-10-CM | POA: Diagnosis not present

## 2016-02-10 ENCOUNTER — Other Ambulatory Visit: Payer: Self-pay | Admitting: Internal Medicine

## 2016-02-10 NOTE — Telephone Encounter (Signed)
Approved: 30 x 0 

## 2016-02-10 NOTE — Telephone Encounter (Signed)
Last filled 01-09-16 #30 Last OV 01-09-16 Next OV 01-11-17

## 2016-02-20 ENCOUNTER — Other Ambulatory Visit: Payer: Self-pay | Admitting: Internal Medicine

## 2016-02-24 DIAGNOSIS — S63591D Other specified sprain of right wrist, subsequent encounter: Secondary | ICD-10-CM | POA: Diagnosis not present

## 2016-03-03 ENCOUNTER — Other Ambulatory Visit: Payer: Self-pay | Admitting: Internal Medicine

## 2016-03-03 NOTE — Telephone Encounter (Signed)
Last filled 01-21-16 #90 Last OV 01-09-16 Next OV 01-11-17

## 2016-03-03 NOTE — Telephone Encounter (Signed)
Left refill on voice mail at pharmacy  

## 2016-03-03 NOTE — Telephone Encounter (Signed)
Approved: #90 x 0 

## 2016-03-09 ENCOUNTER — Other Ambulatory Visit: Payer: Self-pay | Admitting: Internal Medicine

## 2016-03-09 DIAGNOSIS — M25531 Pain in right wrist: Secondary | ICD-10-CM | POA: Diagnosis not present

## 2016-03-09 NOTE — Telephone Encounter (Signed)
Last filled #30 02-10-16 Last OV 01-09-16 Next OV 01-11-17

## 2016-03-09 NOTE — Telephone Encounter (Signed)
Approved: #30 x 3 

## 2016-03-09 NOTE — Telephone Encounter (Signed)
Rx sent electronically.  

## 2016-04-03 DIAGNOSIS — H40053 Ocular hypertension, bilateral: Secondary | ICD-10-CM | POA: Diagnosis not present

## 2016-04-03 DIAGNOSIS — H40013 Open angle with borderline findings, low risk, bilateral: Secondary | ICD-10-CM | POA: Diagnosis not present

## 2016-04-06 ENCOUNTER — Other Ambulatory Visit: Payer: Self-pay | Admitting: Internal Medicine

## 2016-04-06 DIAGNOSIS — M25531 Pain in right wrist: Secondary | ICD-10-CM | POA: Diagnosis not present

## 2016-04-06 NOTE — Telephone Encounter (Signed)
Left refill on voice mail at pharmacy  

## 2016-04-06 NOTE — Telephone Encounter (Signed)
Approved: #90 x 0 

## 2016-04-06 NOTE — Telephone Encounter (Signed)
Last filled 03-03-16 #90 Last OV 01-09-16 Next OV 01-11-17

## 2016-05-04 ENCOUNTER — Other Ambulatory Visit: Payer: Self-pay | Admitting: Internal Medicine

## 2016-05-04 NOTE — Telephone Encounter (Signed)
Approved: #90 x 0 

## 2016-05-04 NOTE — Telephone Encounter (Signed)
Left refill on voice mail at pharmacy  

## 2016-05-04 NOTE — Telephone Encounter (Signed)
Last filled 04-06-16 #90 Last OV 01-12-16 Next OV 01-12-16

## 2016-05-30 ENCOUNTER — Other Ambulatory Visit: Payer: Self-pay | Admitting: Internal Medicine

## 2016-06-01 NOTE — Telephone Encounter (Signed)
Last filled 05-04-16 #90 Last OV 01-09-16 Next OV 01-11-17

## 2016-06-01 NOTE — Telephone Encounter (Signed)
Approved: #90 x 0 

## 2016-06-01 NOTE — Telephone Encounter (Signed)
Left refill on voice mail at pharmacy  

## 2016-06-06 ENCOUNTER — Other Ambulatory Visit: Payer: Self-pay | Admitting: Internal Medicine

## 2016-06-11 ENCOUNTER — Other Ambulatory Visit: Payer: Self-pay | Admitting: Internal Medicine

## 2016-06-25 DIAGNOSIS — M25531 Pain in right wrist: Secondary | ICD-10-CM | POA: Diagnosis not present

## 2016-06-29 ENCOUNTER — Other Ambulatory Visit: Payer: Self-pay | Admitting: Internal Medicine

## 2016-06-29 NOTE — Telephone Encounter (Signed)
Left refill on voice mail at pharmacy  

## 2016-06-29 NOTE — Telephone Encounter (Signed)
Tramadol last filled 06-01-16 #90 Last OV 01-09-16 Next OV 01-11-17.  Forwarding to Dr Deborra Medina in Dr Alla German absence. Please send back to [facility] with Approval/Denial

## 2016-07-04 ENCOUNTER — Other Ambulatory Visit: Payer: Self-pay | Admitting: Internal Medicine

## 2016-07-06 NOTE — Telephone Encounter (Signed)
Last filled 06-07-16 #30 Last OV 01-09-16 Next OV 01-11-17.

## 2016-07-06 NOTE — Telephone Encounter (Signed)
Approved: #30 x 2 

## 2016-07-17 DIAGNOSIS — Z23 Encounter for immunization: Secondary | ICD-10-CM | POA: Diagnosis not present

## 2016-07-24 DIAGNOSIS — M25531 Pain in right wrist: Secondary | ICD-10-CM | POA: Diagnosis not present

## 2016-07-27 ENCOUNTER — Other Ambulatory Visit: Payer: Self-pay | Admitting: Internal Medicine

## 2016-07-27 NOTE — Telephone Encounter (Signed)
Last filled 06-29-16 #90 Last OV 01-09-16 Next OV 01-11-17

## 2016-07-27 NOTE — Telephone Encounter (Signed)
Left refill on voice mail at pharmacy  

## 2016-07-27 NOTE — Telephone Encounter (Signed)
Approved: #90 x 0 

## 2016-07-30 DIAGNOSIS — Z01419 Encounter for gynecological examination (general) (routine) without abnormal findings: Secondary | ICD-10-CM | POA: Diagnosis not present

## 2016-07-30 DIAGNOSIS — Z6827 Body mass index (BMI) 27.0-27.9, adult: Secondary | ICD-10-CM | POA: Diagnosis not present

## 2016-07-30 DIAGNOSIS — Z1231 Encounter for screening mammogram for malignant neoplasm of breast: Secondary | ICD-10-CM | POA: Diagnosis not present

## 2016-08-06 DIAGNOSIS — M25531 Pain in right wrist: Secondary | ICD-10-CM | POA: Diagnosis not present

## 2016-08-20 DIAGNOSIS — H40013 Open angle with borderline findings, low risk, bilateral: Secondary | ICD-10-CM | POA: Diagnosis not present

## 2016-08-24 ENCOUNTER — Other Ambulatory Visit: Payer: Self-pay | Admitting: Internal Medicine

## 2016-08-24 NOTE — Telephone Encounter (Signed)
Left refill on voice mail at pharmacy  

## 2016-08-24 NOTE — Telephone Encounter (Signed)
Last filled 07-27-16 #90 Last OV 01-09-16 Next OV 01-11-17

## 2016-08-24 NOTE — Telephone Encounter (Signed)
Approved: #90 x 0 

## 2016-08-27 DIAGNOSIS — M778 Other enthesopathies, not elsewhere classified: Secondary | ICD-10-CM | POA: Diagnosis not present

## 2016-09-21 ENCOUNTER — Other Ambulatory Visit: Payer: Self-pay | Admitting: Internal Medicine

## 2016-09-22 NOTE — Telephone Encounter (Signed)
Left refill on voice mail at pharmacy  

## 2016-09-22 NOTE — Telephone Encounter (Signed)
Approved: #90 x 0 

## 2016-09-22 NOTE — Telephone Encounter (Signed)
Last filled 08-24-16 #90 Last OV 01-09-16 Next OV 01-11-17

## 2016-10-02 ENCOUNTER — Other Ambulatory Visit: Payer: Self-pay | Admitting: Internal Medicine

## 2016-10-05 ENCOUNTER — Encounter: Payer: Self-pay | Admitting: Internal Medicine

## 2016-10-20 ENCOUNTER — Other Ambulatory Visit: Payer: Self-pay | Admitting: Internal Medicine

## 2016-10-20 NOTE — Telephone Encounter (Signed)
Last filled 09-22-16 #90 Last OV 01-09-16 Next OV 01-11-17

## 2016-10-20 NOTE — Telephone Encounter (Signed)
Approved: #90 x 0 

## 2016-10-20 NOTE — Telephone Encounter (Signed)
Left refill on voice mail at pharmacy  

## 2016-11-01 ENCOUNTER — Other Ambulatory Visit: Payer: Self-pay | Admitting: Internal Medicine

## 2016-11-04 DIAGNOSIS — M778 Other enthesopathies, not elsewhere classified: Secondary | ICD-10-CM | POA: Diagnosis not present

## 2016-11-22 ENCOUNTER — Other Ambulatory Visit: Payer: Self-pay | Admitting: Internal Medicine

## 2016-11-23 NOTE — Telephone Encounter (Signed)
Approved: okay #90 x 0 Please send other refills to someone else for while I am on vacation

## 2016-11-23 NOTE — Telephone Encounter (Signed)
Rx called in to requested pharmacy 

## 2016-11-23 NOTE — Telephone Encounter (Signed)
Last filled 10/20/2016. Last f/u 12/2015

## 2016-11-30 ENCOUNTER — Other Ambulatory Visit: Payer: Self-pay | Admitting: Internal Medicine

## 2016-11-30 NOTE — Telephone Encounter (Signed)
Last Rx 11/02/2016. Last OV 01/09/2016

## 2016-11-30 NOTE — Telephone Encounter (Signed)
Approved: #30 x 1 

## 2016-12-22 ENCOUNTER — Other Ambulatory Visit: Payer: Self-pay | Admitting: Internal Medicine

## 2016-12-22 NOTE — Telephone Encounter (Signed)
Left refill on voice mail at pharmacy  

## 2016-12-22 NOTE — Telephone Encounter (Signed)
Last filled 11-23-16 #90 Last OV 01-09-16 Next OV 01-11-17

## 2016-12-22 NOTE — Telephone Encounter (Signed)
Approved: #90 x 0 

## 2017-01-01 DIAGNOSIS — M654 Radial styloid tenosynovitis [de Quervain]: Secondary | ICD-10-CM | POA: Diagnosis not present

## 2017-01-01 DIAGNOSIS — G8918 Other acute postprocedural pain: Secondary | ICD-10-CM | POA: Diagnosis not present

## 2017-01-07 DIAGNOSIS — M25639 Stiffness of unspecified wrist, not elsewhere classified: Secondary | ICD-10-CM | POA: Diagnosis not present

## 2017-01-07 DIAGNOSIS — M25431 Effusion, right wrist: Secondary | ICD-10-CM | POA: Diagnosis not present

## 2017-01-07 DIAGNOSIS — M25531 Pain in right wrist: Secondary | ICD-10-CM | POA: Diagnosis not present

## 2017-01-10 DIAGNOSIS — M25639 Stiffness of unspecified wrist, not elsewhere classified: Secondary | ICD-10-CM | POA: Insufficient documentation

## 2017-01-10 DIAGNOSIS — M25531 Pain in right wrist: Secondary | ICD-10-CM

## 2017-01-10 DIAGNOSIS — M25431 Effusion, right wrist: Secondary | ICD-10-CM | POA: Insufficient documentation

## 2017-01-11 ENCOUNTER — Encounter: Payer: Self-pay | Admitting: Internal Medicine

## 2017-01-11 ENCOUNTER — Ambulatory Visit (INDEPENDENT_AMBULATORY_CARE_PROVIDER_SITE_OTHER): Payer: BLUE CROSS/BLUE SHIELD | Admitting: Internal Medicine

## 2017-01-11 VITALS — BP 160/88 | HR 81 | Temp 98.3°F | Ht 67.0 in | Wt 178.5 lb

## 2017-01-11 DIAGNOSIS — I10 Essential (primary) hypertension: Secondary | ICD-10-CM

## 2017-01-11 DIAGNOSIS — K2100 Gastro-esophageal reflux disease with esophagitis, without bleeding: Secondary | ICD-10-CM

## 2017-01-11 DIAGNOSIS — E785 Hyperlipidemia, unspecified: Secondary | ICD-10-CM

## 2017-01-11 DIAGNOSIS — Z0000 Encounter for general adult medical examination without abnormal findings: Secondary | ICD-10-CM

## 2017-01-11 LAB — COMPREHENSIVE METABOLIC PANEL
ALK PHOS: 87 U/L (ref 39–117)
ALT: 16 U/L (ref 0–35)
AST: 16 U/L (ref 0–37)
Albumin: 4.3 g/dL (ref 3.5–5.2)
BILIRUBIN TOTAL: 0.3 mg/dL (ref 0.2–1.2)
BUN: 15 mg/dL (ref 6–23)
CO2: 28 meq/L (ref 19–32)
CREATININE: 1.07 mg/dL (ref 0.40–1.20)
Calcium: 10 mg/dL (ref 8.4–10.5)
Chloride: 104 mEq/L (ref 96–112)
GFR: 66.54 mL/min (ref >60.00)
Glucose, Bld: 83 mg/dL (ref 70–99)
Potassium: 4.3 mEq/L (ref 3.5–5.1)
SODIUM: 139 meq/L (ref 135–145)
TOTAL PROTEIN: 7.4 g/dL (ref 6.0–8.3)

## 2017-01-11 LAB — CBC WITH DIFFERENTIAL/PLATELET
BASOS ABS: 0 10*3/uL (ref 0.0–0.1)
Basophils Relative: 0.6 % (ref 0.0–3.0)
EOS ABS: 0.1 10*3/uL (ref 0.0–0.7)
Eosinophils Relative: 2.3 % (ref 0.0–5.0)
HCT: 36 % (ref 36.0–46.0)
Hemoglobin: 11.7 g/dL — ABNORMAL LOW (ref 12.0–15.0)
LYMPHS ABS: 1.9 10*3/uL (ref 0.7–4.0)
Lymphocytes Relative: 31.9 % (ref 12.0–46.0)
MCHC: 32.4 g/dL (ref 30.0–36.0)
MCV: 91.3 fl (ref 78.0–100.0)
MONO ABS: 0.5 10*3/uL (ref 0.1–1.0)
Monocytes Relative: 8.4 % (ref 3.0–12.0)
NEUTROS ABS: 3.3 10*3/uL (ref 1.4–7.7)
NEUTROS PCT: 56.8 % (ref 43.0–77.0)
PLATELETS: 244 10*3/uL (ref 150.0–400.0)
RBC: 3.94 Mil/uL (ref 3.87–5.11)
RDW: 13.7 % (ref 11.5–15.5)
WBC: 5.8 10*3/uL (ref 4.0–10.5)

## 2017-01-11 LAB — LIPID PANEL
CHOL/HDL RATIO: 2
Cholesterol: 199 mg/dL (ref 0–200)
HDL: 80.8 mg/dL (ref >39.00)
LDL Cholesterol: 101 mg/dL — ABNORMAL HIGH (ref 0–99)
NonHDL: 118.67
TRIGLYCERIDES: 90 mg/dL (ref 0.0–149.0)
VLDL: 18 mg/dL (ref 0.0–40.0)

## 2017-01-11 LAB — T4, FREE: FREE T4: 0.53 ng/dL — AB (ref 0.60–1.60)

## 2017-01-11 MED ORDER — LISINOPRIL-HYDROCHLOROTHIAZIDE 20-12.5 MG PO TABS: 2.0000 | ORAL_TABLET | Freq: Every day | ORAL | 3 refills | Status: DC

## 2017-01-11 NOTE — Progress Notes (Signed)
Subjective:    Patient ID: Sheri [name], female    DOB: 02-Mar-1951, 66 y.o.   MRN: [id number]  HPI Here for physical  Had another surgery on right wrist 4/13 Dr Hurshel Keys  Now wearing brace Has follow up tomorrow Out of work for now  Will have occasional fecal incontinence Now wearing pad Less often if takes the dicyclomine Doesn't even feel when it happens Is due for repeat colonoscopy--she is going to set this up  Stress with mom's care Her dementia is worse In facility Sister now has [place] sees Dr Aquilla Solian for gyn care  Current Outpatient Prescriptions on File Prior to Visit  Medication Sig Dispense Refill  . amLODipine (NORVASC) 5 MG tablet TAKE 1 TABLET BY MOUTH DAILY 90 tablet 2  . aspirin 81 MG tablet Take 81 mg by mouth daily.    Marland Kitchen atorvastatin (LIPITOR) 20 MG tablet TAKE 1 TABLET(20 MG) BY MOUTH DAILY 90 tablet 3  . celecoxib (CELEBREX) 100 MG capsule Take 100 mg by mouth daily.  0  . cyclobenzaprine (FLEXERIL) 10 MG tablet TAKE 1/2 TO 1 TABLET BY MOUTH EVERY NIGHT AT BEDTIME AS NEEDED FOR MUSCLE SPASMS 30 tablet 1  . dicyclomine (BENTYL) 10 MG capsule Take 1 capsule (10 mg total) by mouth 3 (three) times daily as needed for spasms. 90 capsule 1  . fish oil-omega-3 fatty acids 1000 MG capsule Take 2 g by mouth daily.    . furosemide (LASIX) 40 MG tablet Take 1 tablet (40 mg total) by mouth daily. 30 tablet 11  . lisinopril (PRINIVIL,ZESTRIL) 40 MG tablet TAKE 1 TABLET BY MOUTH DAILY 90 tablet 2  . Multiple Vitamins-Minerals (MULTIVITAMIN WITH MINERALS) tablet Take 1 tablet by mouth daily.    Marland Kitchen omeprazole (PRILOSEC) 20 MG capsule Take 1 capsule (20 mg total) by mouth 2 (two) times daily before a meal. 60 capsule 11  . tamsulosin (FLOMAX) 0.4 MG CAPS capsule Take 1 capsule (0.4 mg total) by mouth every evening. 30 capsule 11  . traMADol (ULTRAM) 50 MG tablet TAKE 1 TABLET BY MOUTH THREE TIMES DAILY 90 tablet 0   No current facility-administered  medications on file prior to visit.     Allergies  Allergen Reactions  . Codeine Nausea And Vomiting    Past Medical History:  Diagnosis Date  . Allergy   . GERD (gastroesophageal reflux disease)   . Hypertension   . IBS (irritable bowel syndrome)   . Nocturia   . Personal history of colonic adenomas 10/09/2013  . Wrist fracture, left 1/14    Past Surgical History:  Procedure Laterality Date  . ABDOMINAL HYSTERECTOMY    . CARPAL TUNNEL RELEASE  1982   rt  . CARPAL TUNNEL RELEASE Right 09/12/2013   Procedure: RIGHT CARPAL TUNNEL RELEASE;  Surgeon: Cammie Sickle., MD;  Location: [facility];  Service: Orthopedics;  Laterality: Right;  . DORSAL COMPARTMENT RELEASE Left 03/16/2013   Procedure: LEFT FIRST RELEASE DORSAL COMPARTMENT (DEQUERVAIN) EXCISION OF CYST  A1 PULLEY LEFT THUMB;  Surgeon: Cammie Sickle., MD;  Location: [facility];  Service: Orthopedics;  Laterality: Left;  . TONSILLECTOMY    . TUBAL LIGATION      Family History  Problem Relation Age of Onset  . Dementia Mother     Lewy body  . Hypertension Sister   . Cancer Sister     breast  . Colon cancer Neg Hx   . Esophageal cancer Neg Hx   .  Rectal cancer Neg Hx   . Stomach cancer Neg Hx   . Heart disease Neg Hx     Social History   Social History  . Marital status: Divorced    Spouse name: N/A  . Number of children: 2  . Years of education: 13   Occupational History  . Stock returns Psychologist, forensic   Social History Main Topics  . Smoking status: Former Smoker    Types: Cigarettes    Quit date: 10/13/2002  . Smokeless tobacco: Never Used  . Alcohol use No  . Drug use: No  . Sexual activity: No   Other Topics Concern  . 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 Married '73 - 18 yr/divorced. 1 dtr - '82, 1 son - '73; 1 grandchild.    Lives in her own place, daughter lives with her.    Mother with dementia   Review of Systems    Constitutional: Negative for fatigue and unexpected weight change.       Wears seat belt  HENT: Positive for tinnitus and trouble swallowing. Negative for dental problem and hearing loss.        Overdue for dentist---most are hers  Eyes: Negative for visual disturbance.       No diplopia or unilateral vision loss  Respiratory: Negative for cough, chest tightness and shortness of breath.   Cardiovascular: Positive for palpitations. Negative for chest pain and leg swelling.       Occ fast beat (not racing though)  Gastrointestinal: Positive for abdominal distention. Negative for abdominal pain, constipation and nausea.       Will get a lot of heartburn---will occasionally have it affect her voice  Endocrine: Negative for polydipsia and polyuria.  Genitourinary: Negative for dyspareunia, dysuria and hematuria.  Musculoskeletal: Positive for arthralgias and back pain. Negative for joint swelling.       Bad knees and back pain--uses OTC pain patch  Skin: Negative for rash.       No suspicious lesions  Allergic/Immunologic: Positive for environmental allergies. Negative for immunocompromised state.       Rare eye itching or sneezing  Neurological: Negative for dizziness, syncope, light-headedness and headaches.  Hematological: Negative for adenopathy. Does not bruise/bleed easily.  Psychiatric/Behavioral: Positive for sleep disturbance. Negative for dysphoric mood. The patient is not nervous/anxious.        Up often --mostly to void       Objective:   Physical Exam  Constitutional: She is oriented to person, place, and time. She appears well-nourished. No distress.  HENT:  Head: Normocephalic and atraumatic.  Right Ear: External ear normal.  Left Ear: External ear normal.  Mouth/Throat: Oropharynx is clear and moist. No oropharyngeal exudate.  Eyes: Conjunctivae are normal. Pupils are equal, round, and reactive to light.  Neck: No thyromegaly present.  Cardiovascular: Normal rate,  regular rhythm, normal heart sounds and intact distal pulses.  Exam reveals no gallop.   No murmur heard. Pulmonary/Chest: Effort normal and breath sounds normal. No respiratory distress. She has no wheezes. She has no rales.  Abdominal: Soft. There is no tenderness.  Musculoskeletal: She exhibits no edema or tenderness.  Splint on right forearm/hand  Lymphadenopathy:    She has no cervical adenopathy.  Neurological: She is alert and oriented to person, place, and time.  Skin: No rash noted. No erythema.  Psychiatric: She has a normal mood and affect. Her behavior is normal.  Assessment & Plan:

## 2017-01-11 NOTE — Patient Instructions (Signed)
Stop the plain lisinopril and start the combination lisinopril with HCTZ--- 2 tabs daily.  DASH Eating Plan DASH stands for "Dietary Approaches to Stop Hypertension." The DASH eating plan is a healthy eating plan that has been shown to reduce high blood pressure (hypertension). It may also reduce your risk for type 2 diabetes, heart disease, and stroke. The DASH eating plan may also help with weight loss. What are tips for following this plan? General guidelines   Avoid eating more than 2,300 mg (milligrams) of salt (sodium) a day. If you have hypertension, you may need to reduce your sodium intake to 1,500 mg a day.  Limit alcohol intake to no more than 1 drink a day for nonpregnant women and 2 drinks a day for men. One drink equals 12 oz of beer, 5 oz of wine, or 1 oz of hard liquor.  Work with your health care provider to maintain a healthy body weight or to lose weight. Ask what an ideal weight is for you.  Get at least 30 minutes of exercise that causes your heart to beat faster (aerobic exercise) most days of the week. Activities may include walking, swimming, or biking.  Work with your health care provider or diet and nutrition specialist (dietitian) to adjust your eating plan to your individual calorie needs. Reading food labels   Check food labels for the amount of sodium per serving. Choose foods with less than 5 percent of the Daily Value of sodium. Generally, foods with less than 300 mg of sodium per serving fit into this eating plan.  To find whole grains, look for the word "whole" as the first word in the ingredient list. Shopping   Buy products labeled as "low-sodium" or "no salt added."  Buy fresh foods. Avoid canned foods and premade or frozen meals. Cooking   Avoid adding salt when cooking. Use salt-free seasonings or herbs instead of table salt or sea salt. Check with your health care provider or pharmacist before using salt substitutes.  Do not fry foods. Cook foods  using healthy methods such as baking, boiling, grilling, and broiling instead.  Cook with heart-healthy oils, such as olive, canola, soybean, or sunflower oil. Meal planning    Eat a balanced diet that includes:  5 or more servings of fruits and vegetables each day. At each meal, try to fill half of your plate with fruits and vegetables.  Up to 6-8 servings of whole grains each day.  Less than 6 oz of lean meat, poultry, or fish each day. A 3-oz serving of meat is about the same size as a deck of cards. One egg equals 1 oz.  2 servings of low-fat dairy each day.  A serving of nuts, seeds, or beans 5 times each week.  Heart-healthy fats. Healthy fats called Omega-3 fatty acids are found in foods such as flaxseeds and coldwater fish, like sardines, salmon, and mackerel.  Limit how much you eat of the following:  Canned or prepackaged foods.  Food that is high in trans fat, such as fried foods.  Food that is high in saturated fat, such as fatty meat.  Sweets, desserts, sugary drinks, and other foods with added sugar.  Full-fat dairy products.  Do not salt foods before eating.  Try to eat at least 2 vegetarian meals each week.  Eat more home-cooked food and less restaurant, buffet, and fast food.  When eating at a restaurant, ask that your food be prepared with less salt or no salt, if  possible. What foods are recommended? The items listed may not be a complete list. Talk with your dietitian about what dietary choices are best for you. Grains  Whole-grain or whole-wheat bread. Whole-grain or whole-wheat pasta. Brown rice. Sheri [name]. Bulgur. Whole-grain and low-sodium cereals. Pita bread. Low-fat, low-sodium crackers. Whole-wheat flour tortillas. Vegetables  Fresh or frozen vegetables (raw, steamed, roasted, or grilled). Low-sodium or reduced-sodium tomato and vegetable juice. Low-sodium or reduced-sodium tomato sauce and tomato paste. Low-sodium or reduced-sodium canned  vegetables. Fruits  All fresh, dried, or frozen fruit. Canned fruit in natural juice (without added sugar). Meat and other protein foods  Skinless chicken or [place]. Ground chicken or [place]. Pork with fat trimmed off. Fish and seafood. Egg whites. Dried beans, peas, or lentils. Unsalted nuts, nut butters, and seeds. Unsalted canned beans. Lean cuts of beef with fat trimmed off. Low-sodium, lean deli meat. Dairy  Low-fat (1%) or fat-free (skim) milk. Fat-free, low-fat, or reduced-fat cheeses. Nonfat, low-sodium ricotta or cottage cheese. Low-fat or nonfat yogurt. Low-fat, low-sodium cheese. Fats and oils  Soft margarine without trans fats. Vegetable oil. Low-fat, reduced-fat, or light mayonnaise and salad dressings (reduced-sodium). Canola, safflower, olive, soybean, and sunflower oils. Avocado. Seasoning and other foods  Herbs. Spices. Seasoning mixes without salt. Unsalted popcorn and pretzels. Fat-free sweets. What foods are not recommended? The items listed may not be a complete list. Talk with your dietitian about what dietary choices are best for you. Grains  Baked goods made with fat, such as croissants, muffins, or some breads. Dry pasta or rice meal packs. Vegetables  Creamed or fried vegetables. Vegetables in a cheese sauce. Regular canned vegetables (not low-sodium or reduced-sodium). Regular canned tomato sauce and paste (not low-sodium or reduced-sodium). Regular tomato and vegetable juice (not low-sodium or reduced-sodium). Sheri [name]. Olives. Fruits  Canned fruit in a light or heavy syrup. Fried fruit. Fruit in cream or butter sauce. Meat and other protein foods  Fatty cuts of meat. Ribs. Fried meat. Sheri [name]. Sausage. Bologna and other processed lunch meats. Salami. Fatback. Hotdogs. Bratwurst. Salted nuts and seeds. Canned beans with added salt. Canned or smoked fish. Whole eggs or egg yolks. Chicken or [place] with skin. Dairy  Whole or 2% milk, cream, and half-and-half. Whole or  full-fat cream cheese. Whole-fat or sweetened yogurt. Full-fat cheese. Nondairy creamers. Whipped toppings. Processed cheese and cheese spreads. Fats and oils  Butter. Stick margarine. Lard. Shortening. Ghee. Bacon fat. Tropical oils, such as coconut, palm kernel, or palm oil. Seasoning and other foods  Salted popcorn and pretzels. Onion salt, garlic salt, seasoned salt, table salt, and sea salt. Worcestershire sauce. Tartar sauce. Barbecue sauce. Teriyaki sauce. Soy sauce, including reduced-sodium. Steak sauce. Canned and packaged gravies. Fish sauce. Oyster sauce. Cocktail sauce. Horseradish that you find on the shelf. Ketchup. Mustard. Meat flavorings and tenderizers. Bouillon cubes. Hot sauce and Tabasco sauce. Premade or packaged marinades. Premade or packaged taco seasonings. Relishes. Regular salad dressings. Where to find more information:  National Heart, Lung, and [place]: https://wilson-eaton.com/  American Heart Association: www.heart.org Summary  The DASH eating plan is a healthy eating plan that has been shown to reduce high blood pressure (hypertension). It may also reduce your risk for type 2 diabetes, heart disease, and stroke.  With the DASH eating plan, you should limit salt (sodium) intake to 2,300 mg a day. If you have hypertension, you may need to reduce your sodium intake to 1,500 mg a day.  When on the DASH eating plan, aim to eat more  fresh fruits and vegetables, whole grains, lean proteins, low-fat dairy, and heart-healthy fats.  Work with your health care provider or diet and nutrition specialist (dietitian) to adjust your eating plan to your individual calorie needs. This information is not intended to replace advice given to you by your health care provider. Make sure you discuss any questions you have with your health care provider. Document Released: 08/27/2011 Document Revised: 08/31/2016 Document Reviewed: 08/31/2016 Elsevier Interactive Patient Education  2017  [employer].

## 2017-01-11 NOTE — Assessment & Plan Note (Signed)
BP Readings from Last 3 Encounters:  01/11/17 (!) 160/88  01/09/16 122/70  10/31/15 (!) 160/80   Repeat 162/80 on right Will add HCTZ

## 2017-01-11 NOTE — Assessment & Plan Note (Signed)
Having some dysphagia despite bid PPI May need EGD (message sent to Dr Carlean Purl)

## 2017-01-11 NOTE — Assessment & Plan Note (Signed)
Healthy but needs to work on fitness Out of work due to hand surgery Overdue for colonoscopy---is going to set this up Mammogram due later this year

## 2017-01-11 NOTE — Progress Notes (Signed)
Pre visit review using our clinic review tool, if applicable. No additional management support is needed unless otherwise documented below in the visit note. 

## 2017-01-11 NOTE — Assessment & Plan Note (Signed)
No problems with primary prevention 

## 2017-01-12 DIAGNOSIS — M778 Other enthesopathies, not elsewhere classified: Secondary | ICD-10-CM | POA: Diagnosis not present

## 2017-01-13 DIAGNOSIS — M25639 Stiffness of unspecified wrist, not elsewhere classified: Secondary | ICD-10-CM | POA: Diagnosis not present

## 2017-01-13 DIAGNOSIS — M25531 Pain in right wrist: Secondary | ICD-10-CM | POA: Diagnosis not present

## 2017-01-13 DIAGNOSIS — M25431 Effusion, right wrist: Secondary | ICD-10-CM | POA: Diagnosis not present

## 2017-01-18 DIAGNOSIS — M25431 Effusion, right wrist: Secondary | ICD-10-CM | POA: Diagnosis not present

## 2017-01-18 DIAGNOSIS — M25639 Stiffness of unspecified wrist, not elsewhere classified: Secondary | ICD-10-CM | POA: Diagnosis not present

## 2017-01-18 DIAGNOSIS — M25531 Pain in right wrist: Secondary | ICD-10-CM | POA: Diagnosis not present

## 2017-01-19 ENCOUNTER — Other Ambulatory Visit: Payer: Self-pay | Admitting: Internal Medicine

## 2017-01-19 NOTE — Telephone Encounter (Signed)
Last Rx 12/22/2016. Last OV 12/2016. pls advise

## 2017-01-19 NOTE — Telephone Encounter (Signed)
Approved: #90 x 0 

## 2017-01-19 NOTE — Telephone Encounter (Signed)
Rx called in to requested pharmacy 

## 2017-01-25 DIAGNOSIS — M25639 Stiffness of unspecified wrist, not elsewhere classified: Secondary | ICD-10-CM | POA: Diagnosis not present

## 2017-01-28 ENCOUNTER — Other Ambulatory Visit: Payer: Self-pay | Admitting: Internal Medicine

## 2017-02-09 DIAGNOSIS — M25639 Stiffness of unspecified wrist, not elsewhere classified: Secondary | ICD-10-CM | POA: Diagnosis not present

## 2017-02-09 DIAGNOSIS — M25431 Effusion, right wrist: Secondary | ICD-10-CM | POA: Diagnosis not present

## 2017-02-09 DIAGNOSIS — M25531 Pain in right wrist: Secondary | ICD-10-CM | POA: Diagnosis not present

## 2017-02-16 ENCOUNTER — Other Ambulatory Visit: Payer: Self-pay | Admitting: Internal Medicine

## 2017-02-16 DIAGNOSIS — M778 Other enthesopathies, not elsewhere classified: Secondary | ICD-10-CM | POA: Diagnosis not present

## 2017-02-16 NOTE — Telephone Encounter (Signed)
Last filled 01-19-17 #90 Last OV 01-11-17 Next OV 02-23-17

## 2017-02-16 NOTE — Telephone Encounter (Signed)
Approved: #90 x 0 

## 2017-02-16 NOTE — Telephone Encounter (Signed)
Left refill on voice mail at pharmacy  

## 2017-02-17 DIAGNOSIS — M25639 Stiffness of unspecified wrist, not elsewhere classified: Secondary | ICD-10-CM | POA: Diagnosis not present

## 2017-02-18 ENCOUNTER — Other Ambulatory Visit: Payer: Self-pay | Admitting: Internal Medicine

## 2017-02-20 NOTE — Progress Notes (Signed)
We will call her from office re: dysphagia and try to get both EGD and colonoscopy set up -  Am ccing my RN to do this

## 2017-02-22 NOTE — Progress Notes (Signed)
Patient contacted and EGD/colonoscopy arranged for 03/22/17

## 2017-02-23 ENCOUNTER — Encounter: Payer: Self-pay | Admitting: Internal Medicine

## 2017-02-23 ENCOUNTER — Ambulatory Visit (INDEPENDENT_AMBULATORY_CARE_PROVIDER_SITE_OTHER): Payer: BLUE CROSS/BLUE SHIELD | Admitting: Internal Medicine

## 2017-02-23 VITALS — BP 144/70 | HR 57 | Temp 98.5°F | Wt 183.0 lb

## 2017-02-23 DIAGNOSIS — R7989 Other specified abnormal findings of blood chemistry: Secondary | ICD-10-CM

## 2017-02-23 DIAGNOSIS — R946 Abnormal results of thyroid function studies: Secondary | ICD-10-CM | POA: Diagnosis not present

## 2017-02-23 DIAGNOSIS — I10 Essential (primary) hypertension: Secondary | ICD-10-CM

## 2017-02-23 LAB — RENAL FUNCTION PANEL
Albumin: 4.6 g/dL (ref 3.5–5.2)
BUN: 18 mg/dL (ref 6–23)
CALCIUM: 9.9 mg/dL (ref 8.4–10.5)
CO2: 31 meq/L (ref 19–32)
CREATININE: 1.32 mg/dL — AB (ref 0.40–1.20)
Chloride: 100 mEq/L (ref 96–112)
GFR: 52.2 mL/min — ABNORMAL LOW (ref >60.00)
Glucose, Bld: 100 mg/dL — ABNORMAL HIGH (ref 70–99)
PHOSPHORUS: 3.1 mg/dL (ref 2.3–4.6)
Potassium: 4.3 mEq/L (ref 3.5–5.1)
Sodium: 139 mEq/L (ref 135–145)

## 2017-02-23 LAB — TSH: TSH: 1.13 u[IU]/mL (ref 0.35–4.50)

## 2017-02-23 LAB — T4, FREE: Free T4: 0.56 ng/dL — ABNORMAL LOW (ref 0.60–1.60)

## 2017-02-23 NOTE — Progress Notes (Signed)
Subjective:    Patient ID: Sheri [name], female    DOB: [birth year], 66 y.o.   MRN: [id number]  HPI Here for follow up of BP med changes Has been taking the med bid Has had some nocturia with this  Has some head pain in left occiput--very localized spot Awoke her this AM--better with aspirin  Not checking BP No chest pain--ut keeps a lot of gas and feels it in chest (like after eating) Gas pills will help No SOB No dizziness or syncope No edema  Still not back at work Ongoing brace on right wrist Working with OT and doing home exercise program  Current Outpatient Prescriptions on File Prior to Visit  Medication Sig Dispense Refill  . amLODipine (NORVASC) 5 MG tablet TAKE 1 TABLET BY MOUTH DAILY 90 tablet 2  . aspirin 81 MG tablet Take 81 mg by mouth daily.    Marland Kitchen atorvastatin (LIPITOR) 20 MG tablet TAKE 1 TABLET(20 MG) BY MOUTH DAILY 90 tablet 3  . cyclobenzaprine (FLEXERIL) 10 MG tablet TAKE 1/2 TO 1 TABLET BY MOUTH EVERY NIGHT AT BEDTIME AS NEEDED FOR MUSCLE SPASMS 30 tablet 0  . dicyclomine (BENTYL) 10 MG capsule Take 1 capsule (10 mg total) by mouth 3 (three) times daily as needed for spasms. 90 capsule 1  . fish oil-omega-3 fatty acids 1000 MG capsule Take 2 g by mouth daily.    . furosemide (LASIX) 40 MG tablet Take 1 tablet (40 mg total) by mouth daily. 30 tablet 11  . lisinopril-hydrochlorothiazide (PRINZIDE,ZESTORETIC) 20-12.5 MG tablet Take 2 tablets by mouth daily. 180 tablet 3  . Multiple Vitamins-Minerals (MULTIVITAMIN WITH MINERALS) tablet Take 1 tablet by mouth daily.    Marland Kitchen omeprazole (PRILOSEC) 20 MG capsule Take 1 capsule (20 mg total) by mouth 2 (two) times daily before a meal. 60 capsule 11  . tamsulosin (FLOMAX) 0.4 MG CAPS capsule Take 1 capsule (0.4 mg total) by mouth every evening. 30 capsule 11  . traMADol (ULTRAM) 50 MG tablet TAKE 1 TABLET BY MOUTH THREE TIMES DAILY 90 tablet 0   No current facility-administered medications on file prior to visit.      Allergies  Allergen Reactions  . Codeine Nausea And Vomiting    Past Medical History:  Diagnosis Date  . Allergy   . GERD (gastroesophageal reflux disease)   . Hypertension   . IBS (irritable bowel syndrome)   . Nocturia   . Personal history of colonic adenomas 10/09/2013  . Wrist fracture, left 1/14    Past Surgical History:  Procedure Laterality Date  . ABDOMINAL HYSTERECTOMY    . CARPAL TUNNEL RELEASE  1982   rt  . CARPAL TUNNEL RELEASE Right 09/12/2013   Procedure: RIGHT CARPAL TUNNEL RELEASE;  Surgeon: Cammie Sickle., MD;  Location: [facility];  Service: Orthopedics;  Laterality: Right;  . DORSAL COMPARTMENT RELEASE Left 03/16/2013   Procedure: LEFT FIRST RELEASE DORSAL COMPARTMENT (DEQUERVAIN) EXCISION OF CYST  A1 PULLEY LEFT THUMB;  Surgeon: Cammie Sickle., MD;  Location: [facility];  Service: Orthopedics;  Laterality: Left;  . TONSILLECTOMY    . TUBAL LIGATION      Family History  Problem Relation Age of Onset  . Dementia Mother        Lewy body  . Hypertension Sister   . Cancer Sister        breast  . Colon cancer Neg Hx   . Esophageal cancer Neg Hx   .  Rectal cancer Neg Hx   . Stomach cancer Neg Hx   . Heart disease Neg Hx     Social History   Social History  . Marital status: Divorced    Spouse name: N/A  . Number of children: 2  . Years of education: 13   Occupational History  . Stock returns Psychologist, forensic   Social History Main Topics  . Smoking status: Former Smoker    Types: Cigarettes    Quit date: 10/13/2002  . Smokeless tobacco: Never Used  . Alcohol use No  . Drug use: No  . Sexual activity: No   Other Topics Concern  . 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 Married '73 - 18 yr/divorced. 1 dtr - '82, 1 son - '73; 1 grandchild.    Lives in her own place, daughter lives with her.    Mother with dementia   Review of Systems Not sleeping well despite the  melatonin Appetite is okay    Objective:   Physical Exam  Constitutional: She appears well-developed and well-nourished. No distress.  Neck: No thyromegaly present.  Cardiovascular: Normal rate, regular rhythm and normal heart sounds.  Exam reveals no gallop.   No murmur heard. Pulmonary/Chest: Effort normal and breath sounds normal. No respiratory distress. She has no wheezes. She has no rales.  Musculoskeletal: She exhibits no edema.  Lymphadenopathy:    She has no cervical adenopathy.          Assessment & Plan:

## 2017-02-23 NOTE — Assessment & Plan Note (Signed)
BP Readings from Last 3 Encounters:  02/23/17 (!) 144/70  01/11/17 (!) 160/88  01/09/16 122/70   Much better No problems with HCTZ Didn't take her meds this AM --so she is probably fine No changes

## 2017-02-23 NOTE — Patient Instructions (Signed)
Take both the lisinopril/HCTZ pills in the morning.

## 2017-02-23 NOTE — Assessment & Plan Note (Signed)
Low free T4 May have been related to vitamin intake Will recheck today

## 2017-02-24 ENCOUNTER — Other Ambulatory Visit: Payer: Self-pay | Admitting: Internal Medicine

## 2017-02-24 ENCOUNTER — Ambulatory Visit (AMBULATORY_SURGERY_CENTER): Payer: Self-pay | Admitting: *Deleted

## 2017-02-24 VITALS — Ht 67.0 in | Wt 184.0 lb

## 2017-02-24 DIAGNOSIS — Z860100 Personal history of colon polyps, unspecified: Secondary | ICD-10-CM

## 2017-02-24 DIAGNOSIS — R1310 Dysphagia, unspecified: Secondary | ICD-10-CM

## 2017-02-24 DIAGNOSIS — M25639 Stiffness of unspecified wrist, not elsewhere classified: Secondary | ICD-10-CM | POA: Diagnosis not present

## 2017-02-24 DIAGNOSIS — R531 Weakness: Secondary | ICD-10-CM | POA: Diagnosis not present

## 2017-02-24 NOTE — Progress Notes (Signed)
Patient denies any allergies to eggs or soy. Patient denies any problems with anesthesia/sedation. Patient denies any oxygen use at home and does not take any diet/weight loss medications. EMMI education assisgned to patient on colonoscopy & EGD, this was explained and instructions given to patient. 

## 2017-03-03 ENCOUNTER — Other Ambulatory Visit: Payer: Self-pay | Admitting: Internal Medicine

## 2017-03-03 DIAGNOSIS — M25639 Stiffness of unspecified wrist, not elsewhere classified: Secondary | ICD-10-CM | POA: Diagnosis not present

## 2017-03-03 DIAGNOSIS — R531 Weakness: Secondary | ICD-10-CM | POA: Diagnosis not present

## 2017-03-03 NOTE — Telephone Encounter (Signed)
Approved: #90 x 1 

## 2017-03-03 NOTE — Telephone Encounter (Signed)
Last f/u 02/2017. Last Rx 10/2015 #90 1R. pls advise

## 2017-03-08 ENCOUNTER — Encounter: Payer: Self-pay | Admitting: Internal Medicine

## 2017-03-12 ENCOUNTER — Other Ambulatory Visit: Payer: Self-pay | Admitting: Internal Medicine

## 2017-03-16 ENCOUNTER — Other Ambulatory Visit: Payer: Self-pay | Admitting: Internal Medicine

## 2017-03-16 NOTE — Telephone Encounter (Signed)
Last filled 02-16-17 #90 Last OV 02-23-17 Next OV 01-21-18

## 2017-03-16 NOTE — Telephone Encounter (Signed)
Left refill on voice mail at pharmacy  

## 2017-03-16 NOTE — Telephone Encounter (Signed)
Approved: #90 x 0 May be a day early---will see if pharmacy will fill

## 2017-03-18 DIAGNOSIS — M778 Other enthesopathies, not elsewhere classified: Secondary | ICD-10-CM | POA: Diagnosis not present

## 2017-03-21 HISTORY — PX: ESOPHAGOGASTRODUODENOSCOPY (EGD) WITH ESOPHAGEAL DILATION: SHX5812

## 2017-03-22 ENCOUNTER — Encounter: Payer: Self-pay | Admitting: Internal Medicine

## 2017-03-22 ENCOUNTER — Ambulatory Visit (AMBULATORY_SURGERY_CENTER): Payer: BLUE CROSS/BLUE SHIELD | Admitting: Internal Medicine

## 2017-03-22 VITALS — BP 131/83 | HR 82 | Temp 97.8°F | Resp 18 | Ht 67.0 in | Wt 184.0 lb

## 2017-03-22 DIAGNOSIS — Z86010 Personal history of colonic polyps: Secondary | ICD-10-CM | POA: Diagnosis not present

## 2017-03-22 DIAGNOSIS — K222 Esophageal obstruction: Secondary | ICD-10-CM

## 2017-03-22 DIAGNOSIS — D122 Benign neoplasm of ascending colon: Secondary | ICD-10-CM

## 2017-03-22 DIAGNOSIS — R1310 Dysphagia, unspecified: Secondary | ICD-10-CM | POA: Diagnosis not present

## 2017-03-22 DIAGNOSIS — R1319 Other dysphagia: Secondary | ICD-10-CM

## 2017-03-22 DIAGNOSIS — K6389 Other specified diseases of intestine: Secondary | ICD-10-CM

## 2017-03-22 MED ORDER — SODIUM CHLORIDE 0.9 % IV SOLN: 500.0000 mL | INTRAVENOUS | Status: DC

## 2017-03-22 NOTE — Progress Notes (Signed)
Report given to PACU, vss 

## 2017-03-22 NOTE — Op Note (Signed)
[place] Patient Name: Sheri [name] Procedure Date: 03/22/2017 3:12 PM MRN: [id number] Endoscopist: Gatha Mayer , MD Age: 66 Referring MD:  Date of Birth: 06/19/1951 Gender: Female Account #: [id number] Procedure:                Upper GI endoscopy Indications:              Dysphagia Medicines:                Propofol per Anesthesia, Monitored Anesthesia Care Procedure:                Pre-Anesthesia Assessment:                           - Prior to the procedure, a History and Physical                            was performed, and patient medications and                            allergies were reviewed. The patient's tolerance of                            previous anesthesia was also reviewed. The risks                            and benefits of the procedure and the sedation                            options and risks were discussed with the patient.                            All questions were answered, and informed consent                            was obtained. Prior Anticoagulants: The patient has                            taken no previous anticoagulant or antiplatelet                            agents. ASA Grade Assessment: II - A patient with                            mild systemic disease. After reviewing the risks                            and benefits, the patient was deemed in                            satisfactory condition to undergo the procedure.                           After obtaining informed consent, the endoscope was  passed under direct vision. Throughout the                            procedure, the patient's blood pressure, pulse, and                            oxygen saturations were monitored continuously. The                            Model GIF-HQ190 [id number]) scope was introduced                            through the mouth, and advanced to the second part                            of duodenum. The  upper GI endoscopy was                            accomplished without difficulty. The patient                            tolerated the procedure well. Scope In: Scope Out: Findings:                 Mucosal changes including ringed esophagus (GE                            junction), longitudinal furrows and white plaques                            were found in the entire esophagus. Biopsies were                            obtained from the proximal and distal esophagus                            with cold forceps for histology of suspected                            eosinophilic esophagitis. Verification of patient                            identification for the specimen was done. Estimated                            blood loss was minimal. The scope was withdrawn.                            Dilation was performed with a Maloney dilator with                            mild resistance at 60 Fr.                           The exam was  otherwise without abnormality. Complications:            No immediate complications. Estimated Blood Loss:     Estimated blood loss was minimal. Impression:               - Esophageal mucosal changes suspicious for                            eosinophilic esophagitis. Biopsied. Dilated.                           - The examination was otherwise normal. Recommendation:           - Patient has a contact number available for                            emergencies. The signs and symptoms of potential                            delayed complications were discussed with the                            patient. Return to normal activities tomorrow.                            Written discharge instructions were provided to the                            patient.                           - Clear liquids x 1 hour then soft foods rest of                            day. Start prior diet tomorrow.                           - Await pathology results.                            - Continue present medications. Gatha Mayer, MD 03/22/2017 3:46:47 PM This report has been signed electronically.

## 2017-03-22 NOTE — Op Note (Signed)
[place] Patient Name: Sheri [name] Procedure Date: 03/22/2017 3:11 PM MRN: [id number] Endoscopist: Gatha Mayer , MD Age: 66 Referring MD:  Date of Birth: Apr 19, 1951 Gender: Female Account #: [id number] Procedure:                Colonoscopy Indications:              Surveillance: Personal history of adenomatous                            polyps on last colonoscopy 5 years ago Medicines:                Propofol per Anesthesia, Monitored Anesthesia Care Procedure:                Pre-Anesthesia Assessment:                           - Prior to the procedure, a History and Physical                            was performed, and patient medications and                            allergies were reviewed. The patient's tolerance of                            previous anesthesia was also reviewed. The risks                            and benefits of the procedure and the sedation                            options and risks were discussed with the patient.                            All questions were answered, and informed consent                            was obtained. Prior Anticoagulants: The patient has                            taken no previous anticoagulant or antiplatelet                            agents. ASA Grade Assessment: II - A patient with                            mild systemic disease. After reviewing the risks                            and benefits, the patient was deemed in                            satisfactory condition to undergo the procedure.  After obtaining informed consent, the colonoscope                            was passed under direct vision. Throughout the                            procedure, the patient's blood pressure, pulse, and                            oxygen saturations were monitored continuously. The                            Colonoscope was introduced through the anus and   advanced to the the cecum, identified by                            appendiceal orifice and ileocecal valve. The                            colonoscopy was performed without difficulty. The                            patient tolerated the procedure well. The quality                            of the bowel preparation was adequate. The bowel                            preparation used was Miralax. The ileocecal valve,                            appendiceal orifice, and rectum were photographed. Scope In: 3:28:16 PM Scope Out: 3:42:04 PM Scope Withdrawal Time: 0 hours 11 minutes 17 seconds  Total Procedure Duration: 0 hours 13 minutes 48 seconds  Findings:                 The perianal and digital rectal examinations were                            normal.                           A 5 mm polyp was found in the ascending colon. The                            polyp was sessile. The polyp was removed with a                            cold snare. Resection and retrieval were complete.                            Verification of patient identification for the                            specimen  was done. Estimated blood loss was minimal.                           The exam was otherwise without abnormality on                            direct and retroflexion views. Complications:            No immediate complications. Estimated Blood Loss:     Estimated blood loss was minimal. Impression:               - One 5 mm polyp in the ascending colon, removed                            with a cold snare. Resected and retrieved.                           - The examination was otherwise normal on direct                            and retroflexion views.                           - Personal history of colonic polyps. Recommendation:           - Patient has a contact number available for                            emergencies. The signs and symptoms of potential                            delayed  complications were discussed with the                            patient. Return to normal activities tomorrow.                            Written discharge instructions were provided to the                            patient.                           - Clear liquids x 1 hour then soft foods rest of                            day. Start prior diet tomorrow.                           - Continue present medications.                           - Repeat colonoscopy is recommended. The                            colonoscopy date will be determined after pathology  results from today's exam become available for                            review. Gatha Mayer, MD 03/22/2017 3:49:47 PM This report has been signed electronically.

## 2017-03-22 NOTE — Progress Notes (Signed)
Pt's states no medical or surgical changes since previsit or office visit. 

## 2017-03-22 NOTE — Patient Instructions (Signed)
Discharge instructions given. Handout on a dilatation diet,polyps and GERD. Resume previous medications. YOU HAD AN ENDOSCOPIC PROCEDURE TODAY AT [facility]:   Refer to the procedure report that was given to you for any specific questions about what was found during the examination.  If the procedure report does not answer your questions, please call your gastroenterologist to clarify.  If you requested that your care partner not be given the details of your procedure findings, then the procedure report has been included in a sealed envelope for you to review at your convenience later.  YOU SHOULD EXPECT: Some feelings of bloating in the abdomen. Passage of more gas than usual.  Walking can help get rid of the air that was put into your GI tract during the procedure and reduce the bloating. If you had a lower endoscopy (such as a colonoscopy or flexible sigmoidoscopy) you may notice spotting of blood in your stool or on the toilet paper. If you underwent a bowel prep for your procedure, you may not have a normal bowel movement for a few days.  Please Note:  You might notice some irritation and congestion in your nose or some drainage.  This is from the oxygen used during your procedure.  There is no need for concern and it should clear up in a day or so.  SYMPTOMS TO REPORT IMMEDIATELY:   Following lower endoscopy (colonoscopy or flexible sigmoidoscopy):  Excessive amounts of blood in the stool  Significant tenderness or worsening of abdominal pains  Swelling of the abdomen that is new, acute  Fever of 100F or higher   Following upper endoscopy (EGD)  Vomiting of blood or coffee ground material  New chest pain or pain under the shoulder blades  Painful or persistently difficult swallowing  New shortness of breath  Fever of 100F or higher  Black, tarry-looking stools  For urgent or emergent issues, a gastroenterologist can be reached at any hour by calling [phone]  [phone].   DIET:  We do recommend a small meal at first, but then you may proceed to your regular diet.  Drink plenty of fluids but you should avoid alcoholic beverages for 24 hours.  ACTIVITY:  You should plan to take it easy for the rest of today and you should NOT DRIVE or use heavy machinery until tomorrow (because of the sedation medicines used during the test).    FOLLOW UP: Our staff will call the number listed on your records the next business day following your procedure to check on you and address any questions or concerns that you may have regarding the information given to you following your procedure. If we do not reach you, we will leave a message.  However, if you are feeling well and you are not experiencing any problems, there is no need to return our call.  We will assume that you have returned to your regular daily activities without incident.  If any biopsies were taken you will be contacted by phone or by letter within the next 1-3 weeks.  Please call us at [phone] if you have not heard about the biopsies in 3 weeks.    SIGNATURES/CONFIDENTIALITY: You and/or your care partner have signed paperwork which will be entered into your electronic medical record.  These signatures attest to the fact that that the information above on your After Visit Summary has been reviewed and is understood.  Full responsibility of the confidentiality of this discharge information lies with you and/or your  care-partner.

## 2017-03-22 NOTE — Progress Notes (Signed)
Called to room to assist during endoscopic procedure.  Patient ID and intended procedure confirmed with present staff. Received instructions for my participation in the procedure from the performing physician.  

## 2017-03-23 ENCOUNTER — Telehealth: Payer: Self-pay | Admitting: *Deleted

## 2017-03-23 ENCOUNTER — Telehealth: Payer: Self-pay

## 2017-03-23 NOTE — Telephone Encounter (Signed)
  Follow up Call-  Call back number 03/22/2017  Post procedure Call Back phone  # [phone]  Permission to leave phone message Yes  Some recent data might be hidden     Left message sorry missed her but she may call [phone] if there are any questions or concerns ---Lenard Galloway RN

## 2017-03-23 NOTE — Telephone Encounter (Signed)
Called [phone] and left a messaged we tried to reach pt for a follow up call. maw

## 2017-03-25 ENCOUNTER — Encounter: Payer: Self-pay | Admitting: Internal Medicine

## 2017-03-25 ENCOUNTER — Other Ambulatory Visit: Payer: Self-pay | Admitting: Internal Medicine

## 2017-03-25 NOTE — Progress Notes (Signed)
My Chart letter No eosinophilic esophagitis  Polyp was not retrieved - hx polyps recall 2023

## 2017-03-29 ENCOUNTER — Telehealth: Payer: Self-pay | Admitting: Internal Medicine

## 2017-03-30 NOTE — Telephone Encounter (Signed)
Patient notified

## 2017-03-30 NOTE — Telephone Encounter (Signed)
Patient reports that on Sunday she developed incontinence of stool. She has had this problem in the past, but not in a long time.  She has taken pepto bismol and has helped some.  She thinks that this is related to the colonoscopy.  She is asking if you have any suggestions.

## 2017-03-30 NOTE — Telephone Encounter (Signed)
She should try some Imodium AD to help control wahat I am supposing are loose bowel movements - take as directed on the label  It would be unusual for the colonoscopy to cause incontinence but might take some time for the bowel movements to form up in some patients. If she is having a persistent diarrhea she should let me know.

## 2017-04-20 DIAGNOSIS — M5412 Radiculopathy, cervical region: Secondary | ICD-10-CM | POA: Diagnosis not present

## 2017-04-22 ENCOUNTER — Other Ambulatory Visit: Payer: Self-pay | Admitting: Internal Medicine

## 2017-04-22 NOTE — Telephone Encounter (Signed)
Approved: #30 x 1 

## 2017-04-22 NOTE — Telephone Encounter (Signed)
Last refill 03/25/17 # 30 +0  Last OV 02/23/17 Ok to refill?

## 2017-04-23 ENCOUNTER — Other Ambulatory Visit: Payer: Self-pay

## 2017-04-23 NOTE — Telephone Encounter (Signed)
Pt thought tramadol was going to be refilled on 04/22/17 but pharmacy does not have refill; advised I see cyclobenzaprine was requested and pt knew about that rx but request tramadol to [facility] 04/23/17. Pt does not have enough med to wait until Monday for the refill. Dr Silvio Pate has gone for the day. Last refilled # 90 on 03/16/17 and pt last seen 02/23/17.Please advise.

## 2017-04-25 MED ORDER — TRAMADOL HCL 50 MG PO TABS: 50.0000 mg | ORAL_TABLET | Freq: Three times a day (TID) | ORAL | 0 refills | Status: DC

## 2017-04-25 NOTE — Telephone Encounter (Signed)
I didn't get to this until after clinic was closed and I started work on charts on 04/25/17.  Please call in.  Thanks.

## 2017-04-26 NOTE — Telephone Encounter (Signed)
Rx called to pharmacy as instructed. Left message on voicemail for patient to call back. 

## 2017-05-25 ENCOUNTER — Other Ambulatory Visit: Payer: Self-pay | Admitting: Family Medicine

## 2017-05-25 NOTE — Telephone Encounter (Signed)
Left refill on voice mail at pharmacy  

## 2017-05-25 NOTE — Telephone Encounter (Signed)
Last office visit 02/23/2017.  Last refilled 04/25/2017 for #90 with no refills.  Ok to refill?

## 2017-05-25 NOTE — Telephone Encounter (Signed)
Approved: #90 x 0 

## 2017-06-18 ENCOUNTER — Other Ambulatory Visit: Payer: Self-pay | Admitting: Internal Medicine

## 2017-06-18 NOTE — Telephone Encounter (Signed)
Approved: 30 x 0 

## 2017-06-22 ENCOUNTER — Other Ambulatory Visit: Payer: Self-pay | Admitting: Internal Medicine

## 2017-06-22 NOTE — Telephone Encounter (Signed)
Refill called to pharmacy as instructed.

## 2017-06-22 NOTE — Telephone Encounter (Signed)
Approved: #90 x 0 

## 2017-06-22 NOTE — Telephone Encounter (Signed)
Received refill electronically Last office visit 05/25/17 #90 Last office visit 02/23/17 See allergy/contradication

## 2017-07-16 ENCOUNTER — Other Ambulatory Visit: Payer: Self-pay | Admitting: Internal Medicine

## 2017-07-16 DIAGNOSIS — Z23 Encounter for immunization: Secondary | ICD-10-CM | POA: Diagnosis not present

## 2017-07-20 ENCOUNTER — Other Ambulatory Visit: Payer: Self-pay | Admitting: Internal Medicine

## 2017-07-20 NOTE — Telephone Encounter (Signed)
Left refill on voice mail at pharmacy  

## 2017-07-20 NOTE — Telephone Encounter (Signed)
Approved: #90 x 0 

## 2017-07-20 NOTE — Telephone Encounter (Signed)
Last filled 06-22-17 #90 Last Ov 02-23-17 Next OV 01-21-18

## 2017-08-03 ENCOUNTER — Encounter (HOSPITAL_COMMUNITY): Payer: Self-pay | Admitting: Emergency Medicine

## 2017-08-03 ENCOUNTER — Ambulatory Visit (INDEPENDENT_AMBULATORY_CARE_PROVIDER_SITE_OTHER): Payer: BLUE CROSS/BLUE SHIELD

## 2017-08-03 ENCOUNTER — Ambulatory Visit (HOSPITAL_COMMUNITY)
Admission: EM | Admit: 2017-08-03 | Discharge: 2017-08-03 | Disposition: A | Discharge status: Home / Self Care | Payer: BLUE CROSS/BLUE SHIELD | Attending: Family Medicine | Admitting: Family Medicine

## 2017-08-03 DIAGNOSIS — M79671 Pain in right foot: Secondary | ICD-10-CM | POA: Diagnosis not present

## 2017-08-03 DIAGNOSIS — W2209XA Striking against other stationary object, initial encounter: Secondary | ICD-10-CM | POA: Diagnosis not present

## 2017-08-03 DIAGNOSIS — M79674 Pain in right toe(s): Secondary | ICD-10-CM

## 2017-08-03 NOTE — ED Triage Notes (Signed)
Pt sts right pinky toe pain since hitting 2 days ago

## 2017-08-03 NOTE — ED Provider Notes (Signed)
[facility]    CSN: [id number] Arrival date & time: 08/03/17  6834     History   Chief Complaint Chief Complaint  Patient presents with  . Toe Pain    HPI Sheri [name] is a 66 y.o. female.   66 year old female with history of HTN, IBS, GERD, HLD, comes in for 2-3 day history of right 5th toe pain. States she kicked/hit a heater in the room. Had pain/throbbing that is increasingly worse. States pain is worse with weight bearing, putting on socks, wearing closed toe shoes. She has been taking tylenol without any relief. Denies numbness/tingling. Has not noticed any swelling.       Past Medical History:  Diagnosis Date  . Allergy   . GERD (gastroesophageal reflux disease)   . Hypertension   . IBS (irritable bowel syndrome)   . Nocturia   . Personal history of colonic adenomas 10/09/2013  . Wrist fracture, left 1/14    Patient Active Problem List   Diagnosis Date Noted  . Abnormal thyroid blood test 02/23/2017  . Decreased range of motion of wrist 01/10/2017  . Pain and swelling of right wrist 01/10/2017  . Abdominal bloating 10/31/2015  . Preventative health care 12/28/2014  . GERD (gastroesophageal reflux disease)   . Personal history of colonic adenomas 10/09/2013  . Nocturia 11/07/2012  . Hyperlipemia 08/10/2007  . TENSION HEADACHE 07/06/2007  . Essential hypertension, benign 07/06/2007  . IRRITABLE BOWEL SYNDROME 07/06/2007  . OSTEOARTHRITIS 07/06/2007  . FIBROMYALGIA 07/06/2007  . INSOMNIA, HX OF 07/06/2007    Past Surgical History:  Procedure Laterality Date  . ABDOMINAL HYSTERECTOMY    . CARPAL TUNNEL RELEASE  1982   rt  . COLONOSCOPY  2015, 2018  . ESOPHAGOGASTRODUODENOSCOPY (EGD) WITH ESOPHAGEAL DILATION  03/2017   ring  . TONSILLECTOMY    . TUBAL LIGATION      OB History    No data available       Home Medications    Prior to Admission medications   Medication Sig Start Date End Date Taking? Authorizing Provider    amLODipine (NORVASC) 5 MG tablet TAKE 1 TABLET BY MOUTH DAILY 03/12/17   Viviana Simpler I, MD  aspirin 81 MG tablet Take 81 mg by mouth daily.    [provider]  atorvastatin (LIPITOR) 20 MG tablet TAKE 1 TABLET(20 MG) BY MOUTH DAILY 02/18/17   Viviana Simpler I, MD  cyclobenzaprine (FLEXERIL) 10 MG tablet TAKE 1/2 TO 1 TABLET BY MOUTH EVERY NIGHT AT BEDTIME AS NEEDED FOR MUSCLE SPASMS 07/16/17   Venia Carbon, MD  dicyclomine (BENTYL) 10 MG capsule TAKE 1 CAPSULE(10 MG) BY MOUTH THREE TIMES DAILY AS NEEDED FOR SPASMS 03/03/17   Venia Carbon, MD  fish oil-omega-3 fatty acids 1000 MG capsule Take 2 g by mouth daily.    [provider]  furosemide (LASIX) 40 MG tablet Take 1 tablet (40 mg total) by mouth daily. 08/10/13   Norins, Heinz Knuckles, MD  lisinopril-hydrochlorothiazide (PRINZIDE,ZESTORETIC) 20-12.5 MG tablet Take 2 tablets by mouth daily. 01/11/17   Venia Carbon, MD  Multiple Vitamins-Minerals (MULTIVITAMIN WITH MINERALS) tablet Take 1 tablet by mouth daily.    [provider]  omeprazole (PRILOSEC) 20 MG capsule TAKE 1 CAPSULE(20 MG) BY MOUTH TWICE DAILY BEFORE A MEAL 03/26/17   Viviana Simpler I, MD  tamsulosin (FLOMAX) 0.4 MG CAPS capsule Take 1 capsule (0.4 mg total) by mouth every evening. 07/27/13   Norins, Heinz Knuckles, MD  traMADol (ULTRAM) 50 MG tablet TAKE 1 TABLET BY MOUTH THREE TIMES DAILY 07/20/17   Venia Carbon, MD    Family History Family History  Problem Relation Age of Onset  . Dementia Mother        Lewy body  . Hypertension Sister   . Cancer Sister        breast  . Colon cancer Neg Hx   . Esophageal cancer Neg Hx   . Rectal cancer Neg Hx   . Stomach cancer Neg Hx   . Heart disease Neg Hx     Social History Social History   Tobacco Use  . Smoking status: Former Smoker    Types: Cigarettes    Last attempt to quit: 10/13/2002    Years since quitting: 14.8  . Smokeless tobacco: Never Used  Substance Use Topics  . Alcohol  use: No  . Drug use: No     Allergies   Codeine   Review of Systems Review of Systems  Reason unable to perform ROS: See HPI as above.     Physical Exam Triage Vital Signs ED Triage Vitals  Enc Vitals Group     BP 08/03/17 1905 (!) 153/60     Pulse Rate 08/03/17 1905 79     Resp 08/03/17 1905 18     Temp 08/03/17 1905 98.5 F (36.9 C)     Temp Source 08/03/17 1905 Oral     SpO2 08/03/17 1905 96 %     Weight --      Height --      Head Circumference --      Peak Flow --      Pain Score 08/03/17 1906 10     Pain Loc --      Pain Edu? --      Excl. in [place]? --    No data found.  Updated Vital Signs BP (!) 153/60 (BP Location: Right Arm)   Pulse 79   Temp 98.5 F (36.9 C) (Oral)   Resp 18   SpO2 96%   Physical Exam  Constitutional: She is oriented to person, place, and time. She appears well-developed and well-nourished. No distress.  HENT:  Head: Normocephalic and atraumatic.  Eyes: Conjunctivae are normal. Pupils are equal, round, and reactive to light.  Musculoskeletal:  No swelling/contusions seen. Tenderness on palpation of distal 5th toe. Full ROM of ankle/toe. Ankle strength normal and equal bilaterally. Sensation intact.   5th toe toenail with thickening, no subungual hematoma seen.  Neurological: She is alert and oriented to person, place, and time.     UC Treatments / Results  Labs (all labs ordered are listed, but only abnormal results are displayed) Labs Reviewed - No data to display  EKG  EKG Interpretation None       Radiology Dg Foot Complete Right  Result Date: 08/03/2017 CLINICAL DATA:  Painful fifth toe EXAM: RIGHT FOOT COMPLETE - 3+ VIEW COMPARISON:  None. FINDINGS: Questionable nondisplaced tuft fracture distal phalanx fifth digit. No subluxation. No radiopaque foreign body in the soft tissues. Moderate plantar calcaneal spur IMPRESSION: Possible nondisplaced tuft fracture distal phalanx of the fifth digit Electronically Signed    By: Donavan Foil M.D.   On: 08/03/2017 19:36    Procedures Procedures (including critical care time)  Medications Ordered in UC Medications - No data to display   Initial Impression / Assessment and Plan / UC Course  I have reviewed the triage vital signs and the nursing notes.  Pertinent labs & imaging results that were available during my care of the patient were reviewed by me and considered in my medical decision making (see chart for details).    Possible nondisplaced tuft fracture distal phalanx of the fifth digit. Continue tylenol. Patient already with tramadol prescription that she has not taken. Can take as directed. Ice compress and elevation. Post op boot for at least 1 week. Follow up with PCP/orthopedics of symptoms do not improve.   Final Clinical Impressions(s) / UC Diagnoses   Final diagnoses:  Pain of toe of right foot    ED Discharge Orders    None       Arturo Morton 08/03/17 1951

## 2017-08-03 NOTE — Discharge Instructions (Signed)
Possible fracture of the right fifth toe. Take tylenol as directed on the box. Tramadol for breakthrough pain. Elevation with ice compress. Wear post op boot for at least a week/until symptoms resolve. Follow up with PCP/orthopedics if symptoms not improving/does not resolve.

## 2017-08-05 DIAGNOSIS — M25532 Pain in left wrist: Secondary | ICD-10-CM | POA: Diagnosis not present

## 2017-08-05 DIAGNOSIS — M5412 Radiculopathy, cervical region: Secondary | ICD-10-CM | POA: Diagnosis not present

## 2017-08-10 ENCOUNTER — Ambulatory Visit: Payer: Self-pay

## 2017-08-10 NOTE — Telephone Encounter (Signed)
   Reason for Disposition . [1] Cause unknown AND [2] present > 7 days  Answer Assessment - Initial Assessment Questions 1. DESCRIPTION: "Describe the itching you are having." "Where is it located?"     Pt states that she is affected to arms and legs mostly and that it begins as a itch then she will notice a rash. Some are raised, some are dark like moles and then after 2-3 days they scab over.  2. SEVERITY: "How bad is it?"    - MILD - doesn't interfere with normal activities   - MODERATE-SEVERE: interferes with work, school, sleep, or other activities      Mild has been using Benadryl gel. 3. SCRATCHING: "Are there any scratch marks? Bleeding?"     No  4. ONSET: "When did the itching begin?"      2 months ago 5. CAUSE: "What do you think is causing the itching?"      Pt thinks maybe too many strawberries  6. OTHER SYMPTOMS: "Do you have any other symptoms?"      No  7. PREGNANCY: "Is there any chance you are pregnant?" "When was your last menstrual period?"     n/a  Protocols used: ITCHING - LOCALIZED-A-AH

## 2017-08-11 NOTE — Telephone Encounter (Signed)
Please set her up for appt next week---she can cancel if she is improving

## 2017-08-13 ENCOUNTER — Other Ambulatory Visit: Payer: Self-pay | Admitting: Internal Medicine

## 2017-08-18 ENCOUNTER — Other Ambulatory Visit: Payer: Self-pay | Admitting: Internal Medicine

## 2017-08-18 NOTE — Telephone Encounter (Signed)
Approved: #90 x 0 

## 2017-08-18 NOTE — Telephone Encounter (Signed)
Last Rx 07/20/2017. Last OV 02/2017

## 2017-08-18 NOTE — Telephone Encounter (Signed)
Rx called in to requested pharmacy 

## 2017-08-19 ENCOUNTER — Ambulatory Visit (INDEPENDENT_AMBULATORY_CARE_PROVIDER_SITE_OTHER): Payer: BLUE CROSS/BLUE SHIELD | Admitting: Internal Medicine

## 2017-08-19 ENCOUNTER — Encounter: Payer: Self-pay | Admitting: Internal Medicine

## 2017-08-19 DIAGNOSIS — L989 Disorder of the skin and subcutaneous tissue, unspecified: Secondary | ICD-10-CM | POA: Diagnosis not present

## 2017-08-19 MED ORDER — TRIAMCINOLONE ACETONIDE 0.1 % EX CREA: 1.0000 "application " | TOPICAL_CREAM | Freq: Two times a day (BID) | CUTANEOUS | 1 refills | Status: DC | PRN

## 2017-08-19 NOTE — Patient Instructions (Addendum)
Please try a probiotic for a month or so ---to see if you get better bowel control. (activia yogurt is fine). If the skin problem persists, I think you should see a dermatologist.

## 2017-08-19 NOTE — Progress Notes (Signed)
Subjective:    Patient ID: Sheri [name], female    DOB: 02/07/1951, 65 y.o.   MRN: [id number]  HPI Here due to "breaking out"  Having lesions on skin since 2 months ago Relates it to eating strawberries all the time Concerned about lime and a different seasoning she was using Little pimples with scales on them--then broke out all over  They are generally itchy--but that is better Ice helped some Tried OTC antihistamine and hydrocortisone cream  Getting better now Still noticed a new one on her back today Has tried to keep away from the possible inciting food/seasoning  Current Outpatient Medications on File Prior to Visit  Medication Sig Dispense Refill  . amLODipine (NORVASC) 5 MG tablet TAKE 1 TABLET BY MOUTH DAILY 90 tablet 3  . aspirin 81 MG tablet Take 81 mg by mouth daily.    Marland Kitchen atorvastatin (LIPITOR) 20 MG tablet TAKE 1 TABLET(20 MG) BY MOUTH DAILY 90 tablet 3  . cyclobenzaprine (FLEXERIL) 10 MG tablet TAKE 1/2 TO 1 TABLET BY MOUTH EVERY NIGHT AT BEDTIME AS NEEDED FOR MUSCLE SPASMS 30 tablet 0  . dicyclomine (BENTYL) 10 MG capsule TAKE 1 CAPSULE(10 MG) BY MOUTH THREE TIMES DAILY AS NEEDED FOR SPASMS 90 capsule 1  . furosemide (LASIX) 40 MG tablet Take 1 tablet (40 mg total) by mouth daily. 30 tablet 11  . lisinopril-hydrochlorothiazide (PRINZIDE,ZESTORETIC) 20-12.5 MG tablet Take 2 tablets by mouth daily. 180 tablet 3  . Multiple Vitamins-Minerals (MULTIVITAMIN WITH MINERALS) tablet Take 1 tablet by mouth daily.    Marland Kitchen omeprazole (PRILOSEC) 20 MG capsule TAKE 1 CAPSULE(20 MG) BY MOUTH TWICE DAILY BEFORE A MEAL 60 capsule 11  . traMADol (ULTRAM) 50 MG tablet TAKE 1 TABLET BY MOUTH THREE TIMES DAILY 90 tablet 0  . tamsulosin (FLOMAX) 0.4 MG CAPS capsule Take 1 capsule (0.4 mg total) by mouth every evening. 30 capsule 11   No current facility-administered medications on file prior to visit.     Allergies  Allergen Reactions  . Codeine Nausea And Vomiting    Past Medical  History:  Diagnosis Date  . Allergy   . GERD (gastroesophageal reflux disease)   . Hypertension   . IBS (irritable bowel syndrome)   . Nocturia   . Personal history of colonic adenomas 10/09/2013  . Wrist fracture, left 1/14    Past Surgical History:  Procedure Laterality Date  . ABDOMINAL HYSTERECTOMY    . CARPAL TUNNEL RELEASE  1982   rt  . CARPAL TUNNEL RELEASE Right 09/12/2013   Procedure: RIGHT CARPAL TUNNEL RELEASE;  Surgeon: Cammie Sickle., MD;  Location: [facility];  Service: Orthopedics;  Laterality: Right;  . COLONOSCOPY  2015, 2018  . DORSAL COMPARTMENT RELEASE Left 03/16/2013   Procedure: LEFT FIRST RELEASE DORSAL COMPARTMENT (DEQUERVAIN) EXCISION OF CYST  A1 PULLEY LEFT THUMB;  Surgeon: Cammie Sickle., MD;  Location: [facility];  Service: Orthopedics;  Laterality: Left;  . ESOPHAGOGASTRODUODENOSCOPY (EGD) WITH ESOPHAGEAL DILATION  03/2017   ring  . TONSILLECTOMY    . TUBAL LIGATION      Family History  Problem Relation Age of Onset  . Dementia Mother        Lewy body  . Hypertension Sister   . Cancer Sister        breast  . Colon cancer Neg Hx   . Esophageal cancer Neg Hx   . Rectal cancer Neg Hx   . Stomach cancer Neg Hx   .  Heart disease Neg Hx     Social History   Socioeconomic History  . Marital status: Divorced    Spouse name: Not on file  . Number of children: 2  . Years of education: 40  . Highest education level: Not on file  Social Needs  . Financial resource strain: Not on file  . Food insecurity - worry: Not on file  . Food insecurity - inability: Not on file  . Transportation needs - medical: Not on file  . Transportation needs - non-medical: Not on file  Occupational History  . Occupation: Stock returns Scientist, clinical (histocompatibility and immunogenetics)    Comment: Medi  Tobacco Use  . Smoking status: Former Smoker    Types: Cigarettes    Last attempt to quit: 10/13/2002    Years since quitting: 14.8  . Smokeless tobacco: Never Used    Substance and Sexual Activity  . Alcohol use: No  . Drug use: No  . Sexual activity: No  Other Topics Concern  . 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 Married '73 - 18 yr/divorced. 1 dtr - '82, 1 son - '73; 1 grandchild.    Lives in her own place, daughter lives with her.    Mother with dementia   Review of Systems  No fever No preceding illness Had bowel incontinence since after the colonoscopy. Did call GI -- now taking 1/2 imodium daily Getting MRI on neck due to persistent neck pain and hand problems    Objective:   Physical Exam  Constitutional: No distress.  Skin:  3 papulovesicles on left low back Several hypopigmented areas (from healed lesions)          Assessment & Plan:

## 2017-08-19 NOTE — Assessment & Plan Note (Signed)
Not bacterial infection or shingles (based on timing and distribution). Will Rx TAC to use topically May need to see dermatologist if continue (?some reaction to chemicals in food like she thinks)

## 2017-08-24 DIAGNOSIS — G5761 Lesion of plantar nerve, right lower limb: Secondary | ICD-10-CM | POA: Diagnosis not present

## 2017-08-24 DIAGNOSIS — M79671 Pain in right foot: Secondary | ICD-10-CM | POA: Diagnosis not present

## 2017-08-25 DIAGNOSIS — M542 Cervicalgia: Secondary | ICD-10-CM | POA: Diagnosis not present

## 2017-09-07 DIAGNOSIS — M7661 Achilles tendinitis, right leg: Secondary | ICD-10-CM | POA: Diagnosis not present

## 2017-09-07 DIAGNOSIS — M79671 Pain in right foot: Secondary | ICD-10-CM | POA: Diagnosis not present

## 2017-09-07 DIAGNOSIS — M25571 Pain in right ankle and joints of right foot: Secondary | ICD-10-CM | POA: Diagnosis not present

## 2017-09-13 ENCOUNTER — Other Ambulatory Visit: Payer: Self-pay | Admitting: Internal Medicine

## 2017-09-14 ENCOUNTER — Other Ambulatory Visit: Payer: Self-pay | Admitting: Internal Medicine

## 2017-09-15 NOTE — Telephone Encounter (Signed)
Left refill on voice mail at pharmacy  

## 2017-09-15 NOTE — Telephone Encounter (Signed)
Approved: #90 x 0 

## 2017-09-15 NOTE — Telephone Encounter (Signed)
Last filled 08-18-17 #90. Last OV acute 08-19-17 Next OV 01-21-18

## 2017-09-16 DIAGNOSIS — H40013 Open angle with borderline findings, low risk, bilateral: Secondary | ICD-10-CM | POA: Diagnosis not present

## 2017-09-27 DIAGNOSIS — M5412 Radiculopathy, cervical region: Secondary | ICD-10-CM | POA: Diagnosis not present

## 2017-10-01 DIAGNOSIS — Z01419 Encounter for gynecological examination (general) (routine) without abnormal findings: Secondary | ICD-10-CM | POA: Diagnosis not present

## 2017-10-01 DIAGNOSIS — Z6829 Body mass index (BMI) 29.0-29.9, adult: Secondary | ICD-10-CM | POA: Diagnosis not present

## 2017-10-01 DIAGNOSIS — Z1231 Encounter for screening mammogram for malignant neoplasm of breast: Secondary | ICD-10-CM | POA: Diagnosis not present

## 2017-10-07 ENCOUNTER — Ambulatory Visit: Payer: BLUE CROSS/BLUE SHIELD | Admitting: Family Medicine

## 2017-10-07 ENCOUNTER — Encounter: Payer: Self-pay | Admitting: Family Medicine

## 2017-10-07 DIAGNOSIS — J0100 Acute maxillary sinusitis, unspecified: Secondary | ICD-10-CM | POA: Diagnosis not present

## 2017-10-07 MED ORDER — AMOXICILLIN-POT CLAVULANATE 875-125 MG PO TABS: 1.0000 | ORAL_TABLET | Freq: Two times a day (BID) | ORAL | 0 refills | Status: DC

## 2017-10-07 NOTE — Progress Notes (Signed)
Sx started about 11 days ago.  ST initially.  Then sinus pressure.  Has been taking coricidin and theraflu.  She thought she was getting better until the few days.  Voice is more hoarse now.  No ST now.  She thought she had a possible fever with hot flashes but no fever now, at time of OV.  No vomiting.  No diarrhea.  Some cough, variable, better with sipping fluid.  B maxillary and frontal pain now.  Some occ ear pain.  Today she has diffuse aches.    She has L sided HA at baseline.    Meds, vitals, and allergies reviewed.   ROS: Per HPI unless specifically indicated in ROS section   GEN: nad, alert and oriented HEENT: mucous membranes moist, tm w/o erythema, nasal exam w/o erythema, clear discharge noted,  OP with cobblestoning, voice is hoarse, sinuses [id number] NECK: supple w/o LA CV: rrr.   PULM: ctab, no inc wob EXT: no edema

## 2017-10-07 NOTE — Patient Instructions (Signed)
Start augmentin.  Use nasal saline.  Gargle with salt water for your throat if needed.  Rest your voice.  Avoid talking and whispering.   Update [place] as needed.  Take care.  Glad to see you.

## 2017-10-08 DIAGNOSIS — J0100 Acute maxillary sinusitis, unspecified: Secondary | ICD-10-CM | POA: Insufficient documentation

## 2017-10-08 NOTE — Assessment & Plan Note (Signed)
Nontoxic, okay for outpatient f/u.  Start augmentin.  Use nasal saline.  Gargle with salt water if needed.  Rest voice.  Update [place] as needed.  Work note done.  D/w pt.

## 2017-10-12 ENCOUNTER — Other Ambulatory Visit: Payer: Self-pay | Admitting: Internal Medicine

## 2017-10-13 ENCOUNTER — Other Ambulatory Visit: Payer: Self-pay | Admitting: Internal Medicine

## 2017-10-13 NOTE — Telephone Encounter (Signed)
Copied from [place]. Topic: Quick Communication - Rx Refill/Question >> Oct 13, 2017  3:31 PM Cecelia Byars, NT wrote: Medication: tramadol 50 mg  Has the patient contacted their pharmacy? {yes (Agent: If no, request that the patient contact the pharmacy for the refill.) Preferred Pharmacy (with phone number or street name):[facility]  on [address] [phone] Agent: Please be advised that RX refills may take up to 3 business days. We ask that you follow-up with your pharmacy.

## 2017-10-13 NOTE — Telephone Encounter (Signed)
Last filled 09-15-17 #90 Last OV  Acute 10-07-17 Next OV 01-21-18

## 2017-10-14 MED ORDER — TRAMADOL HCL 50 MG PO TABS: 50.0000 mg | ORAL_TABLET | Freq: Three times a day (TID) | ORAL | 0 refills | Status: DC

## 2017-10-21 DIAGNOSIS — M5412 Radiculopathy, cervical region: Secondary | ICD-10-CM | POA: Diagnosis not present

## 2017-11-10 ENCOUNTER — Other Ambulatory Visit: Payer: Self-pay | Admitting: Internal Medicine

## 2017-11-11 ENCOUNTER — Other Ambulatory Visit: Payer: Self-pay | Admitting: Internal Medicine

## 2017-11-11 NOTE — Telephone Encounter (Signed)
Last filled: 10/14/17 #90 Last OV (acute): 08/19/17 Next OV: 01/21/18

## 2017-11-15 DIAGNOSIS — M5412 Radiculopathy, cervical region: Secondary | ICD-10-CM | POA: Diagnosis not present

## 2017-11-29 ENCOUNTER — Encounter: Payer: Self-pay | Admitting: Family Medicine

## 2017-11-29 ENCOUNTER — Ambulatory Visit: Payer: BLUE CROSS/BLUE SHIELD | Admitting: Family Medicine

## 2017-11-29 ENCOUNTER — Ambulatory Visit (INDEPENDENT_AMBULATORY_CARE_PROVIDER_SITE_OTHER)
Admission: RE | Admit: 2017-11-29 | Discharge: 2017-11-29 | Disposition: A | Discharge status: Home / Self Care | Payer: BLUE CROSS/BLUE SHIELD | Source: Ambulatory Visit | Attending: Family Medicine | Admitting: Family Medicine

## 2017-11-29 VITALS — BP 164/86 | HR 102 | Temp 101.5°F | Wt 190.2 lb

## 2017-11-29 DIAGNOSIS — R509 Fever, unspecified: Secondary | ICD-10-CM

## 2017-11-29 DIAGNOSIS — R05 Cough: Secondary | ICD-10-CM | POA: Diagnosis not present

## 2017-11-29 LAB — POC INFLUENZA A&B (BINAX/QUICKVUE)
INFLUENZA A, POC: NEGATIVE
Influenza B, POC: NEGATIVE

## 2017-11-29 MED ORDER — BENZONATATE 200 MG PO CAPS: 200.0000 mg | ORAL_CAPSULE | Freq: Three times a day (TID) | ORAL | 1 refills | Status: DC | PRN

## 2017-11-29 NOTE — Progress Notes (Signed)
Sx started with mild throat irritation on 11/25/17.  Sx worse on 11/26/17.  Cough.  ST.  Fever, dec in appetite.  Vomiting.  Some sputum but she ends up swallowing that before clearing.  Aching diffusely.  Some ear pain.    Meds, vitals, and allergies reviewed.   ROS: Per HPI unless specifically indicated in ROS section   GEN: nad, alert and oriented HEENT: mucous membranes moist, tm w/o erythema, nasal exam w/o erythema, clear discharge noted,  OP with cobblestoning NECK: supple w/o LA CV: rrr.   PULM: ctab, no inc wob EXT: no edema, well perfused.    Flu neg.  D/w pt.  CXR neg, d/w pt at [facility].  Images reviewed.

## 2017-11-29 NOTE — Patient Instructions (Addendum)
Out of work for now.  Tessalon for cough.  Rx sent to pharmacy.  Tylenol for fever and aches.  Rest and fluids.   Gargle with salt water for throat pain.  Take care.  Glad to see you.  Update [place] as needed.

## 2017-11-30 DIAGNOSIS — R509 Fever, unspecified: Secondary | ICD-10-CM | POA: Insufficient documentation

## 2017-11-30 NOTE — Assessment & Plan Note (Signed)
D/w pt. nontoxic.  Okay for outpatient follow-up.  Flu test is negative.  Chest x-ray without pneumonia.  Reasonable for supportive care at this point. Out of work for now.  Tessalon for cough.  Rx sent to pharmacy.  Tylenol for fever and aches.  Rest and fluids.   Gargle with salt water for throat pain.  Update [place] as needed.  It appears that she has a non-flu viral illness that should resolve in the near future.  Discussed with patient.  She agrees.

## 2017-12-03 ENCOUNTER — Telehealth: Payer: Self-pay

## 2017-12-03 MED ORDER — PROMETHAZINE-DM 6.25-15 MG/5ML PO SYRP: 2.5000 mL | ORAL_SOLUTION | Freq: Four times a day (QID) | ORAL | 0 refills | Status: DC | PRN

## 2017-12-03 MED ORDER — DOXYCYCLINE HYCLATE 100 MG PO TABS: 100.0000 mg | ORAL_TABLET | Freq: Two times a day (BID) | ORAL | 0 refills | Status: DC

## 2017-12-03 NOTE — Telephone Encounter (Signed)
Saw Dr Damita Dunnings on 11/29/17.

## 2017-12-03 NOTE — Telephone Encounter (Signed)
Note done.  If not feeling better by tomorrow with the cough medicine then start taking doxy.  rx sent for that.  Thanks.

## 2017-12-03 NOTE — Telephone Encounter (Signed)
Copied from [place]. Topic: General - Other >> Dec 03, 2017  9:22 AM Carolyn Stare wrote:  Pt call to say the following med is not helping her cough   benzonatate (TESSALON) 200 MG capsule  She also is asking if he thinks she need another round of antibiotic    pt would like a call back   [phone]

## 2017-12-03 NOTE — Telephone Encounter (Signed)
She hadn't been on abx yet for this.  See if she has sputum at this point. If so I can address the issue of abx.  I sent cough syrup in.  Thanks.

## 2017-12-03 NOTE — Telephone Encounter (Signed)
Patient says she is most worried about her chest congestion.  Patient says she doesn't know what color her sputum is because it comes about half way up and then goes back down.  I asked what color her nasal mucous was and she says she has never been able to blow her nose.  Patient says she has woke up the past 2 mornings with a sore throat but salt water gargles have helped.  Patient needs a work note to go back to work on Monday.  She will pick it up Monday at lunch time, please.

## 2017-12-04 NOTE — Telephone Encounter (Signed)
Please check on her on Monday 

## 2017-12-06 NOTE — Telephone Encounter (Signed)
Left message for pt to call office to see how she was feeling.  PEC, please find out how her cough is doing and if she started the Doxycycline. Thanks

## 2017-12-09 ENCOUNTER — Other Ambulatory Visit: Payer: Self-pay | Admitting: Internal Medicine

## 2017-12-09 NOTE — Telephone Encounter (Signed)
Copied from [facility] [id number]. Topic: General - Other >> Dec 09, 2017  3:20 PM Darl Householder, RMA wrote: Reason for CRM: Medication refill request for traMADol (ULTRAM) 50 MG tablet to be sent to [facility]

## 2017-12-09 NOTE — Telephone Encounter (Signed)
Refill  Request  Ultram  Last  Fill  11/11/2017  LOV 11/29/2017    Pharmacy [facility]

## 2017-12-10 MED ORDER — TRAMADOL HCL 50 MG PO TABS: ORAL_TABLET | ORAL | 0 refills | Status: DC

## 2017-12-22 ENCOUNTER — Other Ambulatory Visit: Payer: Self-pay | Admitting: Internal Medicine

## 2017-12-22 MED ORDER — CYCLOBENZAPRINE HCL 10 MG PO TABS: 10.0000 mg | ORAL_TABLET | Freq: Every evening | ORAL | 1 refills | Status: DC | PRN

## 2017-12-22 NOTE — Telephone Encounter (Signed)
Approved: #30 x 1 1 at bedtime prn

## 2017-12-22 NOTE — Telephone Encounter (Signed)
Rx refill request: Flexeril 10 mg  LOV: 12/03/17 (acute)  PCP: [facility]  Pharmacy: verified

## 2017-12-22 NOTE — Telephone Encounter (Signed)
Copied from [facility]. Topic: Quick Communication - Rx Refill/Question >> Dec 22, 2017  9:55 AM Arletha Grippe wrote: Medication: cyclobenzaprine (FLEXERIL) 10 MG tablet Has the patient contacted their pharmacy? Yes.   (Agent: If no, request that the patient contact the pharmacy for the refill.) Preferred Pharmacy (with phone number or street name): [facility] [address]  Agent: Please be advised that RX refills may take up to 3 business days. We ask that you follow-up with your pharmacy.

## 2018-01-06 ENCOUNTER — Other Ambulatory Visit: Payer: Self-pay | Admitting: Internal Medicine

## 2018-01-06 NOTE — Telephone Encounter (Signed)
Last filled 12-10-17 #90 Last OV Acute 11-29-17 Next OV 01-21-18  Forwarding to Dr Damita Dunnings in Dr Alla German absence

## 2018-01-09 NOTE — Telephone Encounter (Signed)
Sent. Thanks.   

## 2018-01-21 ENCOUNTER — Encounter: Payer: Self-pay | Admitting: Internal Medicine

## 2018-01-21 ENCOUNTER — Ambulatory Visit (INDEPENDENT_AMBULATORY_CARE_PROVIDER_SITE_OTHER): Payer: BLUE CROSS/BLUE SHIELD | Admitting: Internal Medicine

## 2018-01-21 VITALS — BP 130/72 | HR 72 | Temp 98.4°F | Ht 67.0 in | Wt 188.0 lb

## 2018-01-21 DIAGNOSIS — E785 Hyperlipidemia, unspecified: Secondary | ICD-10-CM

## 2018-01-21 DIAGNOSIS — N184 Chronic kidney disease, stage 4 (severe): Secondary | ICD-10-CM | POA: Insufficient documentation

## 2018-01-21 DIAGNOSIS — N183 Chronic kidney disease, stage 3 unspecified: Secondary | ICD-10-CM | POA: Insufficient documentation

## 2018-01-21 DIAGNOSIS — Z0000 Encounter for general adult medical examination without abnormal findings: Secondary | ICD-10-CM

## 2018-01-21 DIAGNOSIS — K2100 Gastro-esophageal reflux disease with esophagitis, without bleeding: Secondary | ICD-10-CM

## 2018-01-21 DIAGNOSIS — Z0001 Encounter for general adult medical examination with abnormal findings: Secondary | ICD-10-CM | POA: Diagnosis not present

## 2018-01-21 DIAGNOSIS — M797 Fibromyalgia: Secondary | ICD-10-CM

## 2018-01-21 DIAGNOSIS — F39 Unspecified mood [affective] disorder: Secondary | ICD-10-CM

## 2018-01-21 DIAGNOSIS — I10 Essential (primary) hypertension: Secondary | ICD-10-CM | POA: Diagnosis not present

## 2018-01-21 LAB — COMPREHENSIVE METABOLIC PANEL
ALT: 15 U/L (ref 0–35)
AST: 16 U/L (ref 0–37)
Albumin: 4.5 g/dL (ref 3.5–5.2)
Alkaline Phosphatase: 81 U/L (ref 39–117)
BILIRUBIN TOTAL: 0.3 mg/dL (ref 0.2–1.2)
BUN: 24 mg/dL — AB (ref 6–23)
CALCIUM: 9.8 mg/dL (ref 8.4–10.5)
CHLORIDE: 102 meq/L (ref 96–112)
CO2: 30 mEq/L (ref 19–32)
CREATININE: 1.54 mg/dL — AB (ref 0.40–1.20)
GFR: 43.57 mL/min — ABNORMAL LOW (ref >60.00)
Glucose, Bld: 100 mg/dL — ABNORMAL HIGH (ref 70–99)
Potassium: 4.7 mEq/L (ref 3.5–5.1)
SODIUM: 141 meq/L (ref 135–145)
TOTAL PROTEIN: 7.6 g/dL (ref 6.0–8.3)

## 2018-01-21 LAB — CBC
HCT: 34.6 % — ABNORMAL LOW (ref 36.0–46.0)
Hemoglobin: 11 g/dL — ABNORMAL LOW (ref 12.0–15.0)
MCHC: 31.9 g/dL (ref 30.0–36.0)
MCV: 92 fl (ref 78.0–100.0)
Platelets: 254 10*3/uL (ref 150.0–400.0)
RBC: 3.76 Mil/uL — ABNORMAL LOW (ref 3.87–5.11)
RDW: 14.1 % (ref 11.5–15.5)
WBC: 5.7 10*3/uL (ref 4.0–10.5)

## 2018-01-21 LAB — LIPID PANEL
CHOL/HDL RATIO: 3
Cholesterol: 169 mg/dL (ref 0–200)
HDL: 55.6 mg/dL (ref >39.00)
LDL CALC: 82 mg/dL (ref 0–99)
NonHDL: 113.09
TRIGLYCERIDES: 157 mg/dL — AB (ref 0.0–149.0)
VLDL: 31.4 mg/dL (ref 0.0–40.0)

## 2018-01-21 LAB — T4, FREE: Free T4: 0.75 ng/dL (ref 0.60–1.60)

## 2018-01-21 MED ORDER — ESCITALOPRAM OXALATE 10 MG PO TABS: 10.0000 mg | ORAL_TABLET | Freq: Every day | ORAL | 3 refills | Status: DC

## 2018-01-21 NOTE — Assessment & Plan Note (Signed)
Healthy but social/personal stress Colon due 2023 Just had mammo/pap--she will try to get me reports Unable to exercise --discussed staying active Prevnar when 67 Yearly flu vaccine

## 2018-01-21 NOTE — Assessment & Plan Note (Signed)
Chronic achiness and generalized arthritis Has the tramadol

## 2018-01-21 NOTE — Assessment & Plan Note (Signed)
On primary prevention

## 2018-01-21 NOTE — Assessment & Plan Note (Signed)
Still mild dysphagia--even since EGD Continues on the PPI

## 2018-01-21 NOTE — Assessment & Plan Note (Signed)
BP Readings from Last 3 Encounters:  01/21/18 130/72  11/29/17 (!) 164/86  10/07/17 (!) 150/76   Good control now Due for labs

## 2018-01-21 NOTE — Progress Notes (Signed)
Subjective:    Patient ID: Sheri [name], female    DOB: Jun 18, 1951, 67 y.o.   MRN: [id number]  HPI Here for physical  Very stressed Is back to work--still trouble with right wrist 2 bad knees with [name] pain--not ready to take action Mom did die---daughter moved out Stubbed right foot--possible fracture. Possible Achilles issue also Had MRI on neck ---has disc disease and got injection, that didn't help. Not ready to consider surgery Does drop things easily  Uses tramadol tid---doesn't help neck Still hurts all over though Biggest issues are financial  Sleep problems Discussed mind control--she does pray to ease her suffering Feels depressed every day Worried about losing her house  Keeps up with gyn Did have pap and mammogram this year--- both okay  Current Outpatient Medications on File Prior to Visit  Medication Sig Dispense Refill  . amLODipine (NORVASC) 5 MG tablet TAKE 1 TABLET BY MOUTH DAILY 90 tablet 3  . aspirin 81 MG tablet Take 81 mg by mouth daily.    Marland Kitchen atorvastatin (LIPITOR) 20 MG tablet TAKE 1 TABLET(20 MG) BY MOUTH DAILY 90 tablet 3  . BIOTIN PO Take by mouth.    . Calcium Carbonate-Vitamin D (CALCIUM 500 + D) 500-125 MG-UNIT TABS Take by mouth.    . cyclobenzaprine (FLEXERIL) 10 MG tablet Take 1 tablet (10 mg total) by mouth at bedtime as needed for muscle spasms. 30 tablet 1  . furosemide (LASIX) 40 MG tablet Take 1 tablet (40 mg total) by mouth daily. 30 tablet 11  . lisinopril-hydrochlorothiazide (PRINZIDE,ZESTORETIC) 20-12.5 MG tablet Take 2 tablets by mouth daily. 180 tablet 3  . Omega-3 Fatty Acids (FISH OIL PO) Take by mouth.    Marland Kitchen omeprazole (PRILOSEC) 20 MG capsule TAKE 1 CAPSULE(20 MG) BY MOUTH TWICE DAILY BEFORE A MEAL 60 capsule 11  . tamsulosin (FLOMAX) 0.4 MG CAPS capsule Take 1 capsule (0.4 mg total) by mouth every evening. 30 capsule 11  . traMADol (ULTRAM) 50 MG tablet TAKE 1 TABLET(50 MG) BY MOUTH THREE TIMES DAILY 90 tablet 0  .  triamcinolone cream (KENALOG) 0.1 % Apply 1 application topically 2 (two) times daily as needed. 45 g 1   No current facility-administered medications on file prior to visit.     Allergies  Allergen Reactions  . Codeine Nausea And Vomiting    Past Medical History:  Diagnosis Date  . Allergy   . GERD (gastroesophageal reflux disease)   . Hypertension   . IBS (irritable bowel syndrome)   . Nocturia   . Personal history of colonic adenomas 10/09/2013  . Wrist fracture, left 1/14    Past Surgical History:  Procedure Laterality Date  . ABDOMINAL HYSTERECTOMY    . CARPAL TUNNEL RELEASE  1982   rt  . CARPAL TUNNEL RELEASE Right 09/12/2013   Procedure: RIGHT CARPAL TUNNEL RELEASE;  Surgeon: Cammie Sickle., MD;  Location: [facility];  Service: Orthopedics;  Laterality: Right;  . COLONOSCOPY  2015, 2018  . DORSAL COMPARTMENT RELEASE Left 03/16/2013   Procedure: LEFT FIRST RELEASE DORSAL COMPARTMENT (DEQUERVAIN) EXCISION OF CYST  A1 PULLEY LEFT THUMB;  Surgeon: Cammie Sickle., MD;  Location: [facility];  Service: Orthopedics;  Laterality: Left;  . ESOPHAGOGASTRODUODENOSCOPY (EGD) WITH ESOPHAGEAL DILATION  03/2017   ring  . TONSILLECTOMY    . TUBAL LIGATION      Family History  Problem Relation Age of Onset  . Dementia Mother  Lewy body  . Hypertension Sister   . Cancer Sister        breast  . Colon cancer Neg Hx   . Esophageal cancer Neg Hx   . Rectal cancer Neg Hx   . Stomach cancer Neg Hx   . Heart disease Neg Hx     Social History   Socioeconomic History  . Marital status: Divorced    Spouse name: Not on file  . Number of children: 2  . Years of education: 84  . Highest education level: Not on file  Occupational History  . Occupation: Stock returns Scientist, clinical (histocompatibility and immunogenetics)    Comment: Medi  Social Needs  . Financial resource strain: Not on file  . Food insecurity:    Worry: Not on file    Inability: Not on file  . Transportation  needs:    Medical: Not on file    Non-medical: Not on file  Tobacco Use  . Smoking status: Former Smoker    Types: Cigarettes    Last attempt to quit: 10/13/2002    Years since quitting: 15.2  . Smokeless tobacco: Never Used  Substance and Sexual Activity  . Alcohol use: No  . Drug use: No  . Sexual activity: Never  Lifestyle  . Physical activity:    Days per week: Not on file    Minutes per session: Not on file  . Stress: Not on file  Relationships  . Social connections:    Talks on phone: Not on file    Gets together: Not on file    Attends religious service: Not on file    Active member of club or organization: Not on file    Attends meetings of clubs or organizations: Not on file    Relationship status: Not on file  . Intimate partner violence:    Fear of current or ex partner: Not on file    Emotionally abused: Not on file    Physically abused: Not on file    Forced sexual activity: Not on file  Other Topics Concern  . 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 Married '73 - 18 yr/divorced. 1 dtr - '82, 1 son - '73; 1 grandchild.    Lives in her own place   Review of Systems  Constitutional:       Has gained weight---trying to be more careful now Wears seat belt  HENT: Positive for tinnitus and trouble swallowing. Negative for hearing loss.        Needs some dental work done Still some trouble swallowing rice or bread  Eyes:       Going back to eye doctor---some troubles  Respiratory: Negative for chest tightness and shortness of breath.   Cardiovascular: Positive for leg swelling. Negative for palpitations.       Some vague chest pain---?indigestion (takes gas tablet with success) Some afternoon edema  Gastrointestinal: Negative for blood in stool and constipation.  Endocrine: Negative for polydipsia and polyuria.  Genitourinary: Negative for dysuria and hematuria.  Musculoskeletal: Positive for arthralgias, back pain and neck pain.    Skin: Negative for rash.  Allergic/Immunologic: Negative for environmental allergies and immunocompromised state.  Neurological: Negative for dizziness, syncope, light-headedness and headaches.  Hematological: Negative for adenopathy. Does not bruise/bleed easily.  Psychiatric/Behavioral: Positive for dysphoric mood and sleep disturbance. The patient is nervous/anxious.        Objective:   Physical Exam  Constitutional: She is oriented to person, place, and time.  She appears well-developed. No distress.  HENT:  Head: Normocephalic.  Right Ear: External ear normal.  Left Ear: External ear normal.  Mouth/Throat: Oropharynx is clear and moist. No oropharyngeal exudate.  Eyes: Pupils are equal, round, and reactive to light. Conjunctivae are normal.  Neck: No thyromegaly present.  Cardiovascular: Normal rate, regular rhythm, normal heart sounds and intact distal pulses. Exam reveals no friction rub.  No murmur heard. Pulmonary/Chest: Effort normal and breath sounds normal. No respiratory distress. She has no wheezes. She has no rales.  Abdominal: Soft. She exhibits no distension and no mass. There is no tenderness.  Musculoskeletal: She exhibits no edema.  Lymphadenopathy:    She has no cervical adenopathy.  Neurological: She is alert and oriented to person, place, and time.  Skin: Skin is warm. No rash noted.  Psychiatric: Her behavior is normal. Thought content normal.  Tearful at times Not overtly depressed but voices anhedonia, etc          Assessment & Plan:

## 2018-01-21 NOTE — Patient Instructions (Signed)
DASH Eating Plan DASH stands for "Dietary Approaches to Stop Hypertension." The DASH eating plan is a healthy eating plan that has been shown to reduce high blood pressure (hypertension). It may also reduce your risk for type 2 diabetes, heart disease, and stroke. The DASH eating plan may also help with weight loss. What are tips for following this plan? General guidelines  Avoid eating more than 2,300 mg (milligrams) of salt (sodium) a day. If you have hypertension, you may need to reduce your sodium intake to 1,500 mg a day.  Limit alcohol intake to no more than 1 drink a day for nonpregnant women and 2 drinks a day for men. One drink equals 12 oz of beer, 5 oz of wine, or 1 oz of hard liquor.  Work with your health care provider to maintain a healthy body weight or to lose weight. Ask what an ideal weight is for you.  Get at least 30 minutes of exercise that causes your heart to beat faster (aerobic exercise) most days of the week. Activities may include walking, swimming, or biking.  Work with your health care provider or diet and nutrition specialist (dietitian) to adjust your eating plan to your individual calorie needs. Reading food labels  Check food labels for the amount of sodium per serving. Choose foods with less than 5 percent of the Daily Value of sodium. Generally, foods with less than 300 mg of sodium per serving fit into this eating plan.  To find whole grains, look for the word "whole" as the first word in the ingredient list. Shopping  Buy products labeled as "low-sodium" or "no salt added."  Buy fresh foods. Avoid canned foods and premade or frozen meals. Cooking  Avoid adding salt when cooking. Use salt-free seasonings or herbs instead of table salt or sea salt. Check with your health care provider or pharmacist before using salt substitutes.  Do not fry foods. Cook foods using healthy methods such as baking, boiling, grilling, and broiling instead.  Cook with  heart-healthy oils, such as olive, canola, soybean, or sunflower oil. Meal planning   Eat a balanced diet that includes: ? 5 or more servings of fruits and vegetables each day. At each meal, try to fill half of your plate with fruits and vegetables. ? Up to 6-8 servings of whole grains each day. ? Less than 6 oz of lean meat, poultry, or fish each day. A 3-oz serving of meat is about the same size as a deck of cards. One egg equals 1 oz. ? 2 servings of low-fat dairy each day. ? A serving of nuts, seeds, or beans 5 times each week. ? Heart-healthy fats. Healthy fats called Omega-3 fatty acids are found in foods such as flaxseeds and coldwater fish, like sardines, salmon, and mackerel.  Limit how much you eat of the following: ? Canned or prepackaged foods. ? Food that is high in trans fat, such as fried foods. ? Food that is high in saturated fat, such as fatty meat. ? Sweets, desserts, sugary drinks, and other foods with added sugar. ? Full-fat dairy products.  Do not salt foods before eating.  Try to eat at least 2 vegetarian meals each week.  Eat more home-cooked food and less restaurant, buffet, and fast food.  When eating at a restaurant, ask that your food be prepared with less salt or no salt, if possible. What foods are recommended? The items listed may not be a complete list. Talk with your dietitian about what   dietary choices are best for you. Grains Whole-grain or whole-wheat bread. Whole-grain or whole-wheat pasta. Brown rice. Oatmeal. Quinoa. Bulgur. Whole-grain and low-sodium cereals. Pita bread. Low-fat, low-sodium crackers. Whole-wheat flour tortillas. Vegetables Fresh or frozen vegetables (raw, steamed, roasted, or grilled). Low-sodium or reduced-sodium tomato and vegetable juice. Low-sodium or reduced-sodium tomato sauce and tomato paste. Low-sodium or reduced-sodium canned vegetables. Fruits All fresh, dried, or frozen fruit. Canned fruit in natural juice (without  added sugar). Meat and other protein foods Skinless chicken or turkey. Ground chicken or turkey. Pork with fat trimmed off. Fish and seafood. Egg whites. Dried beans, peas, or lentils. Unsalted nuts, nut butters, and seeds. Unsalted canned beans. Lean cuts of beef with fat trimmed off. Low-sodium, lean deli meat. Dairy Low-fat (1%) or fat-free (skim) milk. Fat-free, low-fat, or reduced-fat cheeses. Nonfat, low-sodium ricotta or cottage cheese. Low-fat or nonfat yogurt. Low-fat, low-sodium cheese. Fats and oils Soft margarine without trans fats. Vegetable oil. Low-fat, reduced-fat, or light mayonnaise and salad dressings (reduced-sodium). Canola, safflower, olive, soybean, and sunflower oils. Avocado. Seasoning and other foods Herbs. Spices. Seasoning mixes without salt. Unsalted popcorn and pretzels. Fat-free sweets. What foods are not recommended? The items listed may not be a complete list. Talk with your dietitian about what dietary choices are best for you. Grains Baked goods made with fat, such as croissants, muffins, or some breads. Dry pasta or rice meal packs. Vegetables Creamed or fried vegetables. Vegetables in a cheese sauce. Regular canned vegetables (not low-sodium or reduced-sodium). Regular canned tomato sauce and paste (not low-sodium or reduced-sodium). Regular tomato and vegetable juice (not low-sodium or reduced-sodium). Pickles. Olives. Fruits Canned fruit in a light or heavy syrup. Fried fruit. Fruit in cream or butter sauce. Meat and other protein foods Fatty cuts of meat. Ribs. Fried meat. Bacon. Sausage. Bologna and other processed lunch meats. Salami. Fatback. Hotdogs. Bratwurst. Salted nuts and seeds. Canned beans with added salt. Canned or smoked fish. Whole eggs or egg yolks. Chicken or turkey with skin. Dairy Whole or 2% milk, cream, and half-and-half. Whole or full-fat cream cheese. Whole-fat or sweetened yogurt. Full-fat cheese. Nondairy creamers. Whipped toppings.  Processed cheese and cheese spreads. Fats and oils Butter. Stick margarine. Lard. Shortening. Ghee. Bacon fat. Tropical oils, such as coconut, palm kernel, or palm oil. Seasoning and other foods Salted popcorn and pretzels. Onion salt, garlic salt, seasoned salt, table salt, and sea salt. Worcestershire sauce. Tartar sauce. Barbecue sauce. Teriyaki sauce. Soy sauce, including reduced-sodium. Steak sauce. Canned and packaged gravies. Fish sauce. Oyster sauce. Cocktail sauce. Horseradish that you find on the shelf. Ketchup. Mustard. Meat flavorings and tenderizers. Bouillon cubes. Hot sauce and Tabasco sauce. Premade or packaged marinades. Premade or packaged taco seasonings. Relishes. Regular salad dressings. Where to find more information:  National Heart, Lung, and Blood Institute: www.nhlbi.nih.gov  American Heart Association: www.heart.org Summary  The DASH eating plan is a healthy eating plan that has been shown to reduce high blood pressure (hypertension). It may also reduce your risk for type 2 diabetes, heart disease, and stroke.  With the DASH eating plan, you should limit salt (sodium) intake to 2,300 mg a day. If you have hypertension, you may need to reduce your sodium intake to 1,500 mg a day.  When on the DASH eating plan, aim to eat more fresh fruits and vegetables, whole grains, lean proteins, low-fat dairy, and heart-healthy fats.  Work with your health care provider or diet and nutrition specialist (dietitian) to adjust your eating plan to your individual   calorie needs. This information is not intended to replace advice given to you by your health care provider. Make sure you discuss any questions you have with your health care provider. Document Released: 08/27/2011 Document Revised: 08/31/2016 Document Reviewed: 08/31/2016 Elsevier Interactive Patient Education  2018 Elsevier Inc.  

## 2018-01-21 NOTE — Assessment & Plan Note (Signed)
Mostly reactive but daily depressed mood, sleep disorder, anhedonia Will try lexapro See back 4-6 weeks Working with Production manager, etc

## 2018-01-21 NOTE — Assessment & Plan Note (Signed)
Mild CKD3  Some worse today--but expected with the HCTZ added On ACEI

## 2018-02-07 ENCOUNTER — Other Ambulatory Visit: Payer: Self-pay | Admitting: Family Medicine

## 2018-02-07 NOTE — Telephone Encounter (Signed)
Electronic refill request Last refill 01/09/18 #90 Last office visit 01/21/18 See allergic/contraindication

## 2018-02-22 ENCOUNTER — Other Ambulatory Visit: Payer: Self-pay | Admitting: Internal Medicine

## 2018-02-22 NOTE — Telephone Encounter (Signed)
Name of Medication: Cyclobenzaprine Name of Pharmacy: [facility] or Written Date and Quantity: 01-21-18 #30      Last Office Visit and Type: 01-21-18 CPE Next Office Visit and Type:02-21-18

## 2018-02-23 NOTE — Telephone Encounter (Signed)
Approved: 30 x 0 

## 2018-03-03 ENCOUNTER — Ambulatory Visit: Payer: BLUE CROSS/BLUE SHIELD | Admitting: Internal Medicine

## 2018-03-03 ENCOUNTER — Encounter: Payer: Self-pay | Admitting: Internal Medicine

## 2018-03-03 VITALS — BP 124/60 | HR 76 | Temp 98.9°F | Ht 67.0 in | Wt 187.0 lb

## 2018-03-03 DIAGNOSIS — F39 Unspecified mood [affective] disorder: Secondary | ICD-10-CM

## 2018-03-03 MED ORDER — ESCITALOPRAM OXALATE 20 MG PO TABS: 20.0000 mg | ORAL_TABLET | Freq: Every day | ORAL | 3 refills | Status: DC

## 2018-03-03 NOTE — Progress Notes (Signed)
Subjective:    Patient ID: Sheri [name], female    DOB: 1950-10-04, 67 y.o.   MRN: [id number]  HPI Here for follow up of mood disorder  Reviewed her ongoing stressors Still with financial strain Has considered a second job--too hard with her bad knees Still feels depressed daily Memorial Day weekend she only had a little watermelon--and spent most of the weekend in bed Appetite is worse on the medication (not all bad) Not hopeless---has faith and prays  Current Outpatient Medications on File Prior to Visit  Medication Sig Dispense Refill  . amLODipine (NORVASC) 5 MG tablet TAKE 1 TABLET BY MOUTH DAILY 90 tablet 3  . aspirin 81 MG tablet Take 81 mg by mouth daily.    Marland Kitchen atorvastatin (LIPITOR) 20 MG tablet TAKE 1 TABLET(20 MG) BY MOUTH DAILY 90 tablet 3  . BIOTIN PO Take by mouth.    . Calcium Carbonate-Vitamin D (CALCIUM 500 + D) 500-125 MG-UNIT TABS Take by mouth.    . cyclobenzaprine (FLEXERIL) 10 MG tablet TAKE 1 TABLET(10 MG) BY MOUTH AT BEDTIME AS NEEDED FOR MUSCLE SPASMS 30 tablet 0  . escitalopram (LEXAPRO) 10 MG tablet Take 1 tablet (10 mg total) by mouth daily. 30 tablet 3  . furosemide (LASIX) 40 MG tablet Take 1 tablet (40 mg total) by mouth daily. 30 tablet 11  . lisinopril-hydrochlorothiazide (PRINZIDE,ZESTORETIC) 20-12.5 MG tablet Take 2 tablets by mouth daily. 180 tablet 3  . Omega-3 Fatty Acids (FISH OIL PO) Take by mouth.    Marland Kitchen omeprazole (PRILOSEC) 20 MG capsule TAKE 1 CAPSULE(20 MG) BY MOUTH TWICE DAILY BEFORE A MEAL 60 capsule 11  . tamsulosin (FLOMAX) 0.4 MG CAPS capsule Take 1 capsule (0.4 mg total) by mouth every evening. 30 capsule 11  . traMADol (ULTRAM) 50 MG tablet TAKE 1 TABLET(50 MG) BY MOUTH THREE TIMES DAILY 90 tablet 0  . triamcinolone cream (KENALOG) 0.1 % Apply 1 application topically 2 (two) times daily as needed. 45 g 1   No current facility-administered medications on file prior to visit.     Allergies  Allergen Reactions  . Codeine Nausea And  Vomiting    Past Medical History:  Diagnosis Date  . Allergy   . GERD (gastroesophageal reflux disease)   . Hypertension   . IBS (irritable bowel syndrome)   . Nocturia   . Personal history of colonic adenomas 10/09/2013  . Wrist fracture, left 1/14    Past Surgical History:  Procedure Laterality Date  . ABDOMINAL HYSTERECTOMY    . CARPAL TUNNEL RELEASE  1982   rt  . CARPAL TUNNEL RELEASE Right 09/12/2013   Procedure: RIGHT CARPAL TUNNEL RELEASE;  Surgeon: Cammie Sickle., MD;  Location: [facility];  Service: Orthopedics;  Laterality: Right;  . COLONOSCOPY  2015, 2018  . DORSAL COMPARTMENT RELEASE Left 03/16/2013   Procedure: LEFT FIRST RELEASE DORSAL COMPARTMENT (DEQUERVAIN) EXCISION OF CYST  A1 PULLEY LEFT THUMB;  Surgeon: Cammie Sickle., MD;  Location: [facility];  Service: Orthopedics;  Laterality: Left;  . ESOPHAGOGASTRODUODENOSCOPY (EGD) WITH ESOPHAGEAL DILATION  03/2017   ring  . TONSILLECTOMY    . TUBAL LIGATION      Family History  Problem Relation Age of Onset  . Dementia Mother        Lewy body  . Hypertension Sister   . Cancer Sister        breast  . Colon cancer Neg Hx   . Esophageal cancer Neg  Hx   . Rectal cancer Neg Hx   . Stomach cancer Neg Hx   . Heart disease Neg Hx     Social History   Socioeconomic History  . Marital status: Divorced    Spouse name: Not on file  . Number of children: 2  . Years of education: 7  . Highest education level: Not on file  Occupational History  . Occupation: Stock returns Scientist, clinical (histocompatibility and immunogenetics)    Comment: Medi  Social Needs  . Financial resource strain: Not on file  . Food insecurity:    Worry: Not on file    Inability: Not on file  . Transportation needs:    Medical: Not on file    Non-medical: Not on file  Tobacco Use  . Smoking status: Former Smoker    Types: Cigarettes    Last attempt to quit: 10/13/2002    Years since quitting: 15.3  . Smokeless tobacco: Never Used    Substance and Sexual Activity  . Alcohol use: No  . Drug use: No  . Sexual activity: Never  Lifestyle  . Physical activity:    Days per week: Not on file    Minutes per session: Not on file  . Stress: Not on file  Relationships  . Social connections:    Talks on phone: Not on file    Gets together: Not on file    Attends religious service: Not on file    Active member of club or organization: Not on file    Attends meetings of clubs or organizations: Not on file    Relationship status: Not on file  . Intimate partner violence:    Fear of current or ex partner: Not on file    Emotionally abused: Not on file    Physically abused: Not on file    Forced sexual activity: Not on file  Other Topics Concern  . 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 Married '73 - 18 yr/divorced. 1 dtr - '82, 1 son - '73; 1 grandchild.    Lives in her own place   Review of Systems Hasn't really lost weight No suicidal ideation    Objective:   Physical Exam  Constitutional: She appears well-developed. No distress.  Psychiatric:  Does seem more animated and not as down           Assessment & Plan:

## 2018-03-03 NOTE — Patient Instructions (Signed)
Please take 2 of the 10mg  escitalopram daily. If you don't have any side effects, stay on that dose and you can fill the new prescription at the pharmacy.

## 2018-03-03 NOTE — Assessment & Plan Note (Signed)
Still depressed and mostly reactive---financial Attributes are close to MDD though Will increase the lexapro to 20mg  daily

## 2018-03-07 ENCOUNTER — Other Ambulatory Visit: Payer: Self-pay | Admitting: Internal Medicine

## 2018-03-07 NOTE — Telephone Encounter (Signed)
Last filled 02-07-18 #90 Last OV 03-03-18 Next OV 05-06-18  Will wait for Dr Silvio Pate to return tomorrow since she should not be out of medication.

## 2018-03-15 ENCOUNTER — Other Ambulatory Visit: Payer: Self-pay | Admitting: Internal Medicine

## 2018-03-17 DIAGNOSIS — H2513 Age-related nuclear cataract, bilateral: Secondary | ICD-10-CM | POA: Diagnosis not present

## 2018-03-17 DIAGNOSIS — H40053 Ocular hypertension, bilateral: Secondary | ICD-10-CM | POA: Diagnosis not present

## 2018-03-17 DIAGNOSIS — H40013 Open angle with borderline findings, low risk, bilateral: Secondary | ICD-10-CM | POA: Diagnosis not present

## 2018-03-29 ENCOUNTER — Other Ambulatory Visit: Payer: Self-pay | Admitting: Internal Medicine

## 2018-03-30 NOTE — Telephone Encounter (Signed)
Approved: #30 x 1 

## 2018-04-04 ENCOUNTER — Other Ambulatory Visit: Payer: Self-pay | Admitting: Internal Medicine

## 2018-04-04 NOTE — Telephone Encounter (Signed)
Last filled 03-08-18 #90 Last OV 03-03-18 Next OV 05-06-18  [facility] and [place]

## 2018-04-07 ENCOUNTER — Other Ambulatory Visit: Payer: Self-pay | Admitting: Internal Medicine

## 2018-04-15 ENCOUNTER — Other Ambulatory Visit: Payer: Self-pay | Admitting: Internal Medicine

## 2018-04-20 ENCOUNTER — Telehealth: Payer: Self-pay | Admitting: Internal Medicine

## 2018-04-25 MED ORDER — LISINOPRIL-HYDROCHLOROTHIAZIDE 20-12.5 MG PO TABS: 2.0000 | ORAL_TABLET | Freq: Every day | ORAL | 0 refills | Status: DC

## 2018-04-25 NOTE — Telephone Encounter (Signed)
Because we get getting a refill request for lisinopril 40mg . She is on Lisinopril HCTZ 20-12.5mg  per the med list.  Left message for pt to call office

## 2018-04-25 NOTE — Telephone Encounter (Signed)
Patient is calling and is confused why this medication has not been sent in yet. She states she has been 1 week without this medication. Please advise.

## 2018-04-25 NOTE — Addendum Note (Signed)
Addended by: Helene Shoe on: 04/25/2018 04:34 PM   Modules accepted: Orders

## 2018-04-25 NOTE — Telephone Encounter (Signed)
?   Lisinopril refused per med list.

## 2018-04-25 NOTE — Telephone Encounter (Signed)
I spoke with pt and the label that she has on the BP med is worn off completely so pt does not know name of med. 12/2016 appt lisinopril was changed to lisinopril-HCTZ 20-12.5 mg; pt said she did remember med being changed. I called [facility] and spoke with Merrily Pew and he said Lisinopril 40 mg has not been filled recently; the lisinopril HCTZ 20-12.5 was filled for 90 day supply 09/15/2017. I spoke with pt to see how long she had been without her med; pt said she had been without med for 2 weeks. Pt said she did not want to throw away the lisinopril 40 mg and continued that med until all pills were gone so pt did not start the lisinopril hctz in 12/2016. Pt has appt to see Dr Silvio Pate and will discuss med with him at that time. Pt has not cked BP at home. I advised pt I will refill lisinopril HCTZ 20-12.5mg  for one refill and pt will ck with [facility] and pt will keep appt with Dr Silvio Pate on 05/06/18. FYI to [employer]

## 2018-05-02 ENCOUNTER — Other Ambulatory Visit: Payer: Self-pay | Admitting: Internal Medicine

## 2018-05-02 NOTE — Telephone Encounter (Signed)
Last filled 04-04-18 #120 Last OV 03-03-18 Next OV 05-06-18

## 2018-05-06 ENCOUNTER — Ambulatory Visit: Payer: BLUE CROSS/BLUE SHIELD | Admitting: Internal Medicine

## 2018-05-06 ENCOUNTER — Encounter: Payer: Self-pay | Admitting: Internal Medicine

## 2018-05-06 VITALS — BP 110/66 | HR 67 | Temp 98.7°F | Ht 67.0 in | Wt 177.0 lb

## 2018-05-06 DIAGNOSIS — F39 Unspecified mood [affective] disorder: Secondary | ICD-10-CM | POA: Diagnosis not present

## 2018-05-06 MED ORDER — ATORVASTATIN CALCIUM 20 MG PO TABS: ORAL_TABLET | ORAL | 3 refills | Status: DC

## 2018-05-06 MED ORDER — AMLODIPINE BESYLATE 5 MG PO TABS: 5.0000 mg | ORAL_TABLET | Freq: Every day | ORAL | 3 refills | Status: DC

## 2018-05-06 NOTE — Assessment & Plan Note (Signed)
Chronic anxiety Mostly reactive depression All is better on the higher dose of the escitalopram Will continue

## 2018-05-06 NOTE — Progress Notes (Signed)
Subjective:    Patient ID: Sheri [name], female    DOB: 12-May-1951, 67 y.o.   MRN: [id number]  HPI Here for follow up of depression  Not as anxious and not worrying about everything as much Depression seems better also Ongoing stressors--may face foreclosure, etc Doesn't let things bother her as much  Appetite off some Weight is down 10#  Current Outpatient Medications on File Prior to Visit  Medication Sig Dispense Refill  . amLODipine (NORVASC) 5 MG tablet TAKE 1 TABLET BY MOUTH DAILY 90 tablet 3  . aspirin 81 MG tablet Take 81 mg by mouth daily.    Marland Kitchen atorvastatin (LIPITOR) 20 MG tablet TAKE 1 TABLET(20 MG) BY MOUTH DAILY 90 tablet 3  . BIOTIN PO Take by mouth.    . Calcium Carbonate-Vitamin D (CALCIUM 500 + D) 500-125 MG-UNIT TABS Take by mouth.    . cyclobenzaprine (FLEXERIL) 10 MG tablet TAKE 1 TABLET(10 MG) BY MOUTH AT BEDTIME AS NEEDED FOR MUSCLE SPASMS 30 tablet 1  . escitalopram (LEXAPRO) 20 MG tablet Take 1 tablet (20 mg total) by mouth daily. 30 tablet 3  . furosemide (LASIX) 40 MG tablet Take 1 tablet (40 mg total) by mouth daily. 30 tablet 11  . lisinopril-hydrochlorothiazide (PRINZIDE,ZESTORETIC) 20-12.5 MG tablet Take 2 tablets by mouth daily. 180 tablet 0  . Omega-3 Fatty Acids (FISH OIL PO) Take by mouth.    Marland Kitchen omeprazole (PRILOSEC) 20 MG capsule TAKE 1 CAPSULE(20 MG) BY MOUTH TWICE DAILY BEFORE A MEAL 60 capsule 11  . tamsulosin (FLOMAX) 0.4 MG CAPS capsule Take 1 capsule (0.4 mg total) by mouth every evening. 30 capsule 11  . traMADol (ULTRAM) 50 MG tablet TAKE 1 TABLET(50 MG) BY MOUTH THREE TIMES DAILY 90 tablet 0  . triamcinolone cream (KENALOG) 0.1 % Apply 1 application topically 2 (two) times daily as needed. 45 g 1   No current facility-administered medications on file prior to visit.     Allergies  Allergen Reactions  . Codeine Nausea And Vomiting    Past Medical History:  Diagnosis Date  . Allergy   . GERD (gastroesophageal reflux disease)   .  Hypertension   . IBS (irritable bowel syndrome)   . Nocturia   . Personal history of colonic adenomas 10/09/2013  . Wrist fracture, left 1/14    Past Surgical History:  Procedure Laterality Date  . ABDOMINAL HYSTERECTOMY    . CARPAL TUNNEL RELEASE  1982   rt  . CARPAL TUNNEL RELEASE Right 09/12/2013   Procedure: RIGHT CARPAL TUNNEL RELEASE;  Surgeon: Cammie Sickle., MD;  Location: [facility];  Service: Orthopedics;  Laterality: Right;  . COLONOSCOPY  2015, 2018  . DORSAL COMPARTMENT RELEASE Left 03/16/2013   Procedure: LEFT FIRST RELEASE DORSAL COMPARTMENT (DEQUERVAIN) EXCISION OF CYST  A1 PULLEY LEFT THUMB;  Surgeon: Cammie Sickle., MD;  Location: [facility];  Service: Orthopedics;  Laterality: Left;  . ESOPHAGOGASTRODUODENOSCOPY (EGD) WITH ESOPHAGEAL DILATION  03/2017   ring  . TONSILLECTOMY    . TUBAL LIGATION      Family History  Problem Relation Age of Onset  . Dementia Mother        Lewy body  . Hypertension Sister   . Cancer Sister        breast  . Colon cancer Neg Hx   . Esophageal cancer Neg Hx   . Rectal cancer Neg Hx   . Stomach cancer Neg Hx   . Heart  disease Neg Hx     Social History   Socioeconomic History  . Marital status: Divorced    Spouse name: Not on file  . Number of children: 2  . Years of education: 81  . Highest education level: Not on file  Occupational History  . Occupation: Stock returns Scientist, clinical (histocompatibility and immunogenetics)    Comment: Medi  Social Needs  . Financial resource strain: Not on file  . Food insecurity:    Worry: Not on file    Inability: Not on file  . Transportation needs:    Medical: Not on file    Non-medical: Not on file  Tobacco Use  . Smoking status: Former Smoker    Types: Cigarettes    Last attempt to quit: 10/13/2002    Years since quitting: 15.5  . Smokeless tobacco: Never Used  Substance and Sexual Activity  . Alcohol use: No  . Drug use: No  . Sexual activity: Never  Lifestyle  . Physical  activity:    Days per week: Not on file    Minutes per session: Not on file  . Stress: Not on file  Relationships  . Social connections:    Talks on phone: Not on file    Gets together: Not on file    Attends religious service: Not on file    Active member of club or organization: Not on file    Attends meetings of clubs or organizations: Not on file    Relationship status: Not on file  . Intimate partner violence:    Fear of current or ex partner: Not on file    Emotionally abused: Not on file    Physically abused: Not on file    Forced sexual activity: Not on file  Other Topics Concern  . 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 Married '73 - 18 yr/divorced. 1 dtr - '82, 1 son - '73; 1 grandchild.    Lives in her own place   Review of Systems Some sleep problems--- trouble getting back to sleep after going to bathroom Does use the cyclobenzaprine nightly No change in this since the increased med Ex-husband died earlier in year--- alcoholism. Not a bad thing--he put her through a lot and had remarried, etc    Objective:   Physical Exam  Constitutional: No distress.  Psychiatric: She has a normal mood and affect. Her behavior is normal.           Assessment & Plan:

## 2018-05-06 NOTE — Patient Instructions (Signed)
Please check with your company's HR representative about health insurance.

## 2018-05-30 ENCOUNTER — Other Ambulatory Visit: Payer: Self-pay | Admitting: Internal Medicine

## 2018-05-30 NOTE — Telephone Encounter (Signed)
Tramadol last filled 05-02-18 #90 Last OV 05-06-18 Next OV 01-31-19  Cyclobenzaprine last filled 05-02-18 #30

## 2018-06-24 ENCOUNTER — Other Ambulatory Visit: Payer: Self-pay | Admitting: Internal Medicine

## 2018-06-24 NOTE — Telephone Encounter (Signed)
Last filled 05-30-18 #90 Last OV 05-06-18 Next OV 01-31-19

## 2018-07-04 ENCOUNTER — Other Ambulatory Visit: Payer: Self-pay | Admitting: Internal Medicine

## 2018-07-04 NOTE — Telephone Encounter (Signed)
Last office visit 08/16/209 for Mood Disorder.  Next Appt:  01/31/2019 for AWV. Last refilled 05/30/2018 for #30 with no refills.

## 2018-07-06 ENCOUNTER — Encounter: Payer: Self-pay | Admitting: Emergency Medicine

## 2018-07-06 ENCOUNTER — Encounter: Payer: Self-pay | Admitting: Family Medicine

## 2018-07-06 ENCOUNTER — Ambulatory Visit: Payer: BLUE CROSS/BLUE SHIELD | Admitting: Family Medicine

## 2018-07-06 VITALS — BP 148/88 | HR 86 | Temp 98.1°F | Ht 67.0 in | Wt 178.0 lb

## 2018-07-06 DIAGNOSIS — R350 Frequency of micturition: Secondary | ICD-10-CM | POA: Diagnosis not present

## 2018-07-06 DIAGNOSIS — G8929 Other chronic pain: Secondary | ICD-10-CM | POA: Diagnosis not present

## 2018-07-06 DIAGNOSIS — M545 Low back pain, unspecified: Secondary | ICD-10-CM

## 2018-07-06 LAB — POCT URINALYSIS DIPSTICK
Bilirubin, UA: NEGATIVE
GLUCOSE UA: NEGATIVE
Ketones, UA: NEGATIVE
LEUKOCYTES UA: NEGATIVE
Nitrite, UA: NEGATIVE
PH UA: 5 (ref 5.0–8.0)
Protein, UA: NEGATIVE
RBC UA: NEGATIVE
Spec Grav, UA: 1.025 (ref 1.010–1.025)
Urobilinogen, UA: 0.2 E.U./dL

## 2018-07-06 NOTE — Progress Notes (Signed)
Subjective:    Patient ID: Sheri [name], female    DOB: August 06, 1951, 67 y.o.   MRN: [id number]  HPI This is a 67 yo female who presents today with back pain for about a week. Has a job that involves standing, lifting. Hs noticed some urinary frequency over last couple of days. No hematuria or incontinence. Drinks 3 bottles of water a day. On HCTZ and furosemide.  No pain in legs. Some pressure in lower abdomen. Had one episode of nausea which resolved. Takes tramadol three times a day for fibromyalgia.  She fell at work 06/21/18, had low back pain and has been seeing occupational health. Had PT, some improvement.   Past Medical History:  Diagnosis Date  . Allergy   . GERD (gastroesophageal reflux disease)   . Hypertension   . IBS (irritable bowel syndrome)   . Nocturia   . Personal history of colonic adenomas 10/09/2013  . Wrist fracture, left 1/14   Past Surgical History:  Procedure Laterality Date  . ABDOMINAL HYSTERECTOMY    . CARPAL TUNNEL RELEASE  1982   rt  . CARPAL TUNNEL RELEASE Right 09/12/2013   Procedure: RIGHT CARPAL TUNNEL RELEASE;  Surgeon: Cammie Sickle., MD;  Location: [facility];  Service: Orthopedics;  Laterality: Right;  . COLONOSCOPY  2015, 2018  . DORSAL COMPARTMENT RELEASE Left 03/16/2013   Procedure: LEFT FIRST RELEASE DORSAL COMPARTMENT (DEQUERVAIN) EXCISION OF CYST  A1 PULLEY LEFT THUMB;  Surgeon: Cammie Sickle., MD;  Location: [facility];  Service: Orthopedics;  Laterality: Left;  . ESOPHAGOGASTRODUODENOSCOPY (EGD) WITH ESOPHAGEAL DILATION  03/2017   ring  . TONSILLECTOMY    . TUBAL LIGATION     Family History  Problem Relation Age of Onset  . Dementia Mother        Lewy body  . Hypertension Sister   . Cancer Sister        breast  . Colon cancer Neg Hx   . Esophageal cancer Neg Hx   . Rectal cancer Neg Hx   . Stomach cancer Neg Hx   . Heart disease Neg Hx    Social History   Tobacco Use  . Smoking  status: Former Smoker    Types: Cigarettes    Last attempt to quit: 10/13/2002    Years since quitting: 15.7  . Smokeless tobacco: Never Used  Substance Use Topics  . Alcohol use: No  . Drug use: No      Review of Systems Per HPI    Objective:   Physical Exam  Constitutional: She is oriented to person, place, and time. She appears well-developed and well-nourished. No distress.  HENT:  Head: Normocephalic and atraumatic.  Eyes: Pupils are equal, round, and reactive to light. Conjunctivae and EOM are normal.  Neck: Normal range of motion. Neck supple.  Cardiovascular: Normal rate, regular rhythm and normal heart sounds.  Pulmonary/Chest: Effort normal and breath sounds normal.  Musculoskeletal:  Diffuse tenderness and sensitivity to palpation of entire back. UE/LE strength 5/5, normal ROM. Straight leg raise negative. Gait normal.   Neurological: She is alert and oriented to person, place, and time.  Skin: Skin is warm and dry. She is not diaphoretic.  Psychiatric: She has a normal mood and affect. Her behavior is normal. Judgment and thought content normal.  Vitals reviewed.     BP (!) 148/88 (BP Location: Right Arm, Patient Position: Sitting, Cuff Size: Normal)   Pulse 86   Temp 98.1  F (36.7 C) (Oral)   Ht 5\' 7"  (1.702 m)   Wt 178 lb (80.7 kg)   SpO2 98%   BMI 27.88 kg/m  Wt Readings from Last 3 Encounters:  07/06/18 178 lb (80.7 kg)  05/06/18 177 lb (80.3 kg)  03/03/18 187 lb (84.8 kg)   BP Readings from Last 3 Encounters:  07/06/18 (!) 148/88  05/06/18 110/66  03/03/18 124/60   Results for orders placed or performed in visit on 07/06/18  POCT urinalysis dipstick  Result Value Ref Range   Color, UA yellow    Clarity, UA clear    Glucose, UA Negative Negative   Bilirubin, UA neg    Ketones, UA neg    Spec Grav, UA 1.025 1.010 - 1.025   Blood, UA neg    pH, UA 5.0 5.0 - 8.0   Protein, UA Negative Negative   Urobilinogen, UA 0.2 0.2 or 1.0 E.U./dL    Nitrite, UA neg    Leukocytes, UA Negative Negative   Appearance clear    Odor no        Assessment & Plan:  1. Chronic bilateral low back pain without sciatica - difficult to separate fibromyalgia, recent work injury or more recent sprain/strain, likely a combination - no worrisome findings on history or PE - encouraged her to use heat, acetaminophen 2 tablets every 8 to 12 hours, gentle stretching - RTC precautions reviewed - POCT urinalysis dipstick - provided her written information about fibromyalgia  2. Urinary frequency - urine a little concentrated, encouraged her to increase water intake - POCT urinalysis dipstick  - follow up with PCP in 1-2 months  Sheri Reamer, FNP-BC  [facility]  07/06/2018 2:00 PM

## 2018-07-06 NOTE — Patient Instructions (Signed)
Good to see you today  I think you have a muscle strain  Continue to use flexeril, tylenol 2 tablets every 8 to 12 hours as needed, also use moist heat   Try to walk 15 to 20 minutes a day  Follow up with Dr. Silvio Pate in 4-8 weeks   Myofascial Pain Syndrome and Fibromyalgia Myofascial pain syndrome and fibromyalgia are both pain disorders. This pain may be felt mainly in your muscles.  Myofascial pain syndrome: ? Always has trigger points or tender points in the muscle that will cause pain when pressed. The pain may come and go. ? Usually affects your neck, upper back, and shoulder areas. The pain often radiates into your arms and hands.  Fibromyalgia: ? Has muscle pains and tenderness that come and go. ? Is often associated with fatigue and sleep disturbances. ? Has trigger points. ? Tends to be long-lasting (chronic), but is not life-threatening.  Fibromyalgia and myofascial pain are not the same. However, they often occur together. If you have both conditions, each can make the other worse. Both are common and can cause enough pain and fatigue to make day-to-day activities difficult. What are the causes? The exact causes of fibromyalgia and myofascial pain are not known. People with certain gene types may be more likely to develop fibromyalgia. Some factors can be triggers for both conditions, such as:  Spine disorders.  Arthritis.  Severe injury (trauma) and other physical stressors.  Being under a lot of stress.  A medical illness.  What are the signs or symptoms? Fibromyalgia The main symptom of fibromyalgia is widespread pain and tenderness in your muscles. This can vary over time. Pain is sometimes described as stabbing, shooting, or burning. You may have tingling or numbness, too. You may also have sleep problems and fatigue. You may wake up feeling tired and groggy (fibro fog). Other symptoms may include:  Bowel and bladder problems.  Headaches.  Visual  problems.  Problems with odors and noises.  Depression or mood changes.  Painful menstrual periods (dysmenorrhea).  Dry skin or eyes.  Myofascial pain syndrome Symptoms of myofascial pain syndrome include:  Tight, ropy bands of muscle.  Uncomfortable sensations in muscular areas, such as: ? Aching. ? Cramping. ? Burning. ? Numbness. ? Tingling. ? Muscle weakness.  Trouble moving certain muscles freely (range of motion).  How is this diagnosed? There are no specific tests to diagnose fibromyalgia or myofascial pain syndrome. Both can be hard to diagnose because their symptoms are common in many other conditions. Your health care provider may suspect one or both of these conditions based on your symptoms and medical history. Your health care provider will also do a physical exam. The key to diagnosing fibromyalgia is having pain, fatigue, and other symptoms for more than three months that cannot be explained by another condition. The key to diagnosing myofascial pain syndrome is finding trigger points in muscles that are tender and cause pain elsewhere in your body (referred pain). How is this treated? Treating fibromyalgia and myofascial pain often requires a team of health care providers. This usually starts with your primary provider and a physical therapist. You may also find it helpful to work with alternative health care providers, such as massage therapists or acupuncturists. Treatment for fibromyalgia may include medicines. This may include nonsteroidal anti-inflammatory drugs (NSAIDs), along with other medicines. Treatment for myofascial pain may also include:  NSAIDs.  Cooling and stretching of muscles.  Trigger point injections.  Sound wave (ultrasound) treatments to  stimulate muscles.  Follow these instructions at home:  Take medicines only as directed by your health care provider.  Exercise as directed by your health care provider or physical therapist.  Try  to avoid stressful situations.  Practice relaxation techniques to control your stress. You may want to try: ? Biofeedback. ? Visual imagery. ? Hypnosis. ? Muscle relaxation. ? Yoga. ? Meditation.  Talk to your health care provider about alternative treatments, such as acupuncture or massage treatment.  Maintain a healthy lifestyle. This includes eating a healthy diet and getting enough sleep.  Consider joining a support group.  Do not do activities that stress or strain your muscles. That includes repetitive motions and heavy lifting. Where to find more information:  National Fibromyalgia Association: www.fmaware.[place]: www.arthritis.org  American Chronic Pain Association: OEMDeals.dk Contact a health care provider if:  You have new symptoms.  Your symptoms get worse.  You have side effects from your medicines.  You have trouble sleeping.  Your condition is causing depression or anxiety. This information is not intended to replace advice given to you by your health care provider. Make sure you discuss any questions you have with your health care provider. Document Released: 09/07/2005 Document Revised: 02/13/2016 Document Reviewed: 06/13/2014 Elsevier Interactive Patient Education  [employer].

## 2018-07-22 ENCOUNTER — Other Ambulatory Visit: Payer: Self-pay | Admitting: Internal Medicine

## 2018-07-22 NOTE — Telephone Encounter (Signed)
Last filled 06-24-18 #90 Last OV 07-06-18 Next OV 01-31-19

## 2018-07-23 ENCOUNTER — Other Ambulatory Visit: Payer: Self-pay | Admitting: Internal Medicine

## 2018-08-01 ENCOUNTER — Other Ambulatory Visit: Payer: Self-pay | Admitting: Internal Medicine

## 2018-08-22 ENCOUNTER — Other Ambulatory Visit: Payer: Self-pay | Admitting: Internal Medicine

## 2018-08-22 NOTE — Telephone Encounter (Signed)
Last filled 07-22-18 #90 Last OV 07-06-18 Next OV 01-31-19 [facility] [address]

## 2018-09-16 ENCOUNTER — Other Ambulatory Visit: Payer: Self-pay | Admitting: Internal Medicine

## 2018-09-16 NOTE — Telephone Encounter (Signed)
Last filled 08-22-18 #90 Last OV 07-06-18 Next OV 01-31-19 [place] [place]

## 2018-09-19 ENCOUNTER — Telehealth: Payer: Self-pay | Admitting: Internal Medicine

## 2018-09-19 DIAGNOSIS — H40013 Open angle with borderline findings, low risk, bilateral: Secondary | ICD-10-CM | POA: Diagnosis not present

## 2018-09-19 MED ORDER — VALSARTAN-HYDROCHLOROTHIAZIDE 320-25 MG PO TABS: 1.0000 | ORAL_TABLET | Freq: Every day | ORAL | 2 refills | Status: DC

## 2018-09-19 NOTE — Telephone Encounter (Signed)
Left message on vm per dpr 

## 2018-09-19 NOTE — Telephone Encounter (Signed)
Patient called the office and requesting Dr Silvio Pate to change lisinopril-hydrochlorothiazide to something else. Patient stated that she noticed a lot more of hair loss and hair seem to more thinner. Please advise.

## 2018-09-19 NOTE — Telephone Encounter (Signed)
Okay to change to valsartan/HCTZ 320/25 Probably just send #30 before we do 90 days to be sure she is okay with this

## 2018-10-13 ENCOUNTER — Other Ambulatory Visit: Payer: Self-pay | Admitting: Internal Medicine

## 2018-10-13 NOTE — Telephone Encounter (Signed)
Tramadol last filled 09-16-18 #90 Last OV 07-06-18 Next OV 01-31-19 [facility] [address] [place]

## 2018-11-09 ENCOUNTER — Other Ambulatory Visit: Payer: Self-pay | Admitting: Internal Medicine

## 2018-11-09 NOTE — Telephone Encounter (Signed)
Last filled 10-13-18 #90 Last OV 07-06-18 Next OV 01-31-19 [place]

## 2018-12-08 ENCOUNTER — Other Ambulatory Visit: Payer: Self-pay | Admitting: Internal Medicine

## 2018-12-08 NOTE — Telephone Encounter (Signed)
Last filled 11-09-18 #90 Last OV 07-06-18 Next OV 01-31-19 [facility] [address]

## 2018-12-26 ENCOUNTER — Other Ambulatory Visit: Payer: Self-pay | Admitting: Internal Medicine

## 2019-01-02 ENCOUNTER — Other Ambulatory Visit: Payer: Self-pay | Admitting: Internal Medicine

## 2019-01-05 ENCOUNTER — Other Ambulatory Visit: Payer: Self-pay | Admitting: Internal Medicine

## 2019-01-05 NOTE — Telephone Encounter (Signed)
Last filled 12-08-18 #90 Last OV 07-06-18 Next OV 01-31-19 [facility] [address]

## 2019-01-06 DIAGNOSIS — Z1231 Encounter for screening mammogram for malignant neoplasm of breast: Secondary | ICD-10-CM | POA: Diagnosis not present

## 2019-01-06 DIAGNOSIS — Z6829 Body mass index (BMI) 29.0-29.9, adult: Secondary | ICD-10-CM | POA: Diagnosis not present

## 2019-01-06 DIAGNOSIS — Z01419 Encounter for gynecological examination (general) (routine) without abnormal findings: Secondary | ICD-10-CM | POA: Diagnosis not present

## 2019-01-23 ENCOUNTER — Ambulatory Visit (HOSPITAL_COMMUNITY)
Admission: EM | Admit: 2019-01-23 | Discharge: 2019-01-23 | Disposition: A | Discharge status: Home / Self Care | Payer: BLUE CROSS/BLUE SHIELD | Attending: Family Medicine | Admitting: Family Medicine

## 2019-01-23 ENCOUNTER — Encounter (HOSPITAL_COMMUNITY): Payer: Self-pay | Admitting: Physician Assistant

## 2019-01-23 DIAGNOSIS — W269XXA Contact with unspecified sharp object(s), initial encounter: Secondary | ICD-10-CM

## 2019-01-23 DIAGNOSIS — S01311A Laceration without foreign body of right ear, initial encounter: Secondary | ICD-10-CM

## 2019-01-23 MED ORDER — LIDOCAINE-EPINEPHRINE-TETRACAINE (LET) SOLUTION
NASAL | Status: AC
  Filled 2019-01-23: qty 3

## 2019-01-23 MED ORDER — LIDOCAINE-EPINEPHRINE-TETRACAINE (LET) SOLUTION
3.0000 mL | Freq: Once | NASAL | Status: AC
  Administered 2019-01-23: 3 mL via TOPICAL

## 2019-01-23 NOTE — ED Triage Notes (Signed)
Pt states she lost her balance putting up some curtains and caught her R ear on something and split the side open. Bleeding controlled.

## 2019-01-23 NOTE — Discharge Instructions (Addendum)
Your last tetanus was 2016. 5 sutures placed. You can remove current dressing when you get home, but keep it dressed for the next 24 hours. Ice compress to the area. Keep wound clean and dry. You can clean gently with soap and water. Do not soak area in water. Monitor for spreading redness, increased warmth, increased swelling, fever, follow up for reevaluation needed. Otherwise follow up in 5 days for suture removal.

## 2019-01-23 NOTE — ED Provider Notes (Signed)
[facility]    CSN: [id number] Arrival date & time: 01/23/19  1246     History   Chief Complaint Chief Complaint  Patient presents with  . Laceration    HPI Sheri [name] is a 68 y.o. female.   68 year old female comes in for evaluation of right ear laceration.  States was putting curtains up, lost balance, and caught her ear on something.  Bleeding controlled with pressure. No known foreign body to the area. Denies injury to the head, loss of consciousness.  Part hydrogen peroxide prior to arrival.  Tetanus up-to-date.     Past Medical History:  Diagnosis Date  . Allergy   . GERD (gastroesophageal reflux disease)   . Hypertension   . IBS (irritable bowel syndrome)   . Nocturia   . Personal history of colonic adenomas 10/09/2013  . Wrist fracture, left 1/14    Patient Active Problem List   Diagnosis Date Noted  . Mood disorder ([place]) 01/21/2018  . Chronic kidney disease, stage III (moderate) ([place]) 01/21/2018  . Fever 11/30/2017  . Decreased range of motion of wrist 01/10/2017  . Pain and swelling of right wrist 01/10/2017  . Preventative health care 12/28/2014  . GERD (gastroesophageal reflux disease)   . Personal history of colonic adenomas 10/09/2013  . Nocturia 11/07/2012  . Hyperlipemia 08/10/2007  . TENSION HEADACHE 07/06/2007  . Essential hypertension, benign 07/06/2007  . IRRITABLE BOWEL SYNDROME 07/06/2007  . OSTEOARTHRITIS 07/06/2007  . Fibromyalgia 07/06/2007  . INSOMNIA, HX OF 07/06/2007    Past Surgical History:  Procedure Laterality Date  . ABDOMINAL HYSTERECTOMY    . CARPAL TUNNEL RELEASE  1982   rt  . CARPAL TUNNEL RELEASE Right 09/12/2013   Procedure: RIGHT CARPAL TUNNEL RELEASE;  Surgeon: Cammie Sickle., MD;  Location: [facility];  Service: Orthopedics;  Laterality: Right;  . COLONOSCOPY  2015, 2018  . DORSAL COMPARTMENT RELEASE Left 03/16/2013   Procedure: LEFT FIRST RELEASE DORSAL COMPARTMENT (DEQUERVAIN)  EXCISION OF CYST  A1 PULLEY LEFT THUMB;  Surgeon: Cammie Sickle., MD;  Location: [facility];  Service: Orthopedics;  Laterality: Left;  . ESOPHAGOGASTRODUODENOSCOPY (EGD) WITH ESOPHAGEAL DILATION  03/2017   ring  . TONSILLECTOMY    . TUBAL LIGATION      OB History   No obstetric history on file.      Home Medications    Prior to Admission medications   Medication Sig Start Date End Date Taking? Authorizing Provider  traMADol (ULTRAM) 50 MG tablet TAKE 1 TABLET(50 MG) BY MOUTH THREE TIMES DAILY 01/05/19  Yes Viviana Simpler I, MD  amLODipine (NORVASC) 5 MG tablet Take 1 tablet (5 mg total) by mouth daily. 05/06/18   Venia Carbon, MD  aspirin 81 MG tablet Take 81 mg by mouth daily.    [provider]  atorvastatin (LIPITOR) 20 MG tablet TAKE 1 TABLET(20 MG) BY MOUTH DAILY 05/06/18   Venia Carbon, MD  BIOTIN PO Take by mouth.    [provider]  Calcium Carbonate-Vitamin D (CALCIUM 500 + D) 500-125 MG-UNIT TABS Take by mouth.    [provider]  [facility] PAIN RELIEF 500 MG tablet TAKE 1 TABLET EVERY 4 TO 6 HOURS AS NEEDED 06/22/18   [provider]  cyclobenzaprine (FLEXERIL) 10 MG tablet TAKE 1 TABLET(10 MG) BY MOUTH AT BEDTIME AS NEEDED FOR MUSCLE SPASMS 12/26/18   Viviana Simpler I, MD  escitalopram (LEXAPRO) 20 MG tablet TAKE 1 TABLET(20  MG) BY MOUTH DAILY 01/03/19   Venia Carbon, MD  furosemide (LASIX) 40 MG tablet Take 1 tablet (40 mg total) by mouth daily. 08/10/13   Norins, Heinz Knuckles, MD  gabapentin (NEURONTIN) 300 MG capsule TK 1 C PO TID 06/17/18   [provider]  lisinopril-hydrochlorothiazide (ZESTORETIC) 20-12.5 MG tablet lisinopril 20 mg-hydrochlorothiazide 12.5 mg tablet  TK 2 TS PO D    [provider]  Omega-3 Fatty Acids (FISH OIL PO) Take by mouth.    [provider]  omeprazole (PRILOSEC) 20 MG capsule TAKE 1 CAPSULE(20 MG) BY MOUTH TWICE DAILY BEFORE A MEAL 05/02/18   Viviana Simpler  I, MD  tamsulosin (FLOMAX) 0.4 MG CAPS capsule Take 1 capsule (0.4 mg total) by mouth every evening. 07/27/13   Norins, Heinz Knuckles, MD  triamcinolone cream (KENALOG) 0.1 % Apply 1 application topically 2 (two) times daily as needed. 08/19/17   Venia Carbon, MD    Family History Family History  Problem Relation Age of Onset  . Dementia Mother        Lewy body  . Hypertension Sister   . Cancer Sister        breast  . Colon cancer Neg Hx   . Esophageal cancer Neg Hx   . Rectal cancer Neg Hx   . Stomach cancer Neg Hx   . Heart disease Neg Hx     Social History Social History   Tobacco Use  . Smoking status: Former Smoker    Types: Cigarettes    Last attempt to quit: 10/13/2002    Years since quitting: 16.2  . Smokeless tobacco: Never Used  Substance Use Topics  . Alcohol use: No  . Drug use: No     Allergies   Codeine   Review of Systems Review of Systems  Reason unable to perform ROS: See HPI as above.     Physical Exam Triage Vital Signs ED Triage Vitals  Enc Vitals Group     BP --      Pulse Rate 01/23/19 1304 100     Resp 01/23/19 1304 18     Temp 01/23/19 1304 99.1 F (37.3 C)     Temp src --      SpO2 01/23/19 1304 97 %     Weight --      Height --      Head Circumference --      Peak Flow --      Pain Score 01/23/19 1520 9     Pain Loc --      Pain Edu? --      Excl. in [place]? --    No data found.  Updated Vital Signs Pulse 100   Temp 99.1 F (37.3 C)   Resp 18   SpO2 97%   Visual Acuity Right Eye Distance:   Left Eye Distance:   Bilateral Distance:    Right Eye Near:   Left Eye Near:    Bilateral Near:     Physical Exam Constitutional:      General: She is not in acute distress.    Appearance: She is well-developed. She is not diaphoretic.  HENT:     Head: Normocephalic and atraumatic.     Ears:      Comments: Right ear laceration as shown above.  About 2 cm in length, 0.3 centimeters in depth.  Bleeding controlled with  pressure. Eyes:     Conjunctiva/sclera: Conjunctivae normal.     Pupils: Pupils  are equal, round, and reactive to light.  Neurological:     Mental Status: She is alert and oriented to person, place, and time.        UC Treatments / Results  Labs (all labs ordered are listed, but only abnormal results are displayed) Labs Reviewed - No data to display  EKG None  Radiology No results found.  Procedures Laceration Repair Date/Time: 01/23/2019 8:28 PM Performed by: Ok Edwards, PA-C Authorized by: Raylene Everts, MD   Consent:    Consent obtained:  Verbal   Consent given by:  Patient   Risks discussed:  Infection, pain, poor cosmetic result, poor wound healing and need for additional repair   Alternatives discussed:  No treatment and referral Anesthesia (see MAR for exact dosages):    Anesthesia method:  Topical application and local infiltration   Topical anesthetic:  LET   Local anesthetic:  Lidocaine 2% w/o epi Laceration details:    Location:  Ear   Ear location:  R ear   Length (cm):  2   Depth (mm):  3 Repair type:    Repair type:  Simple Pre-procedure details:    Preparation:  Patient was prepped and draped in usual sterile fashion Exploration:    Hemostasis achieved with:  Direct pressure and LET   Wound exploration: entire depth of wound probed and visualized     Contaminated: no   Treatment:    Area cleansed with:  Hibiclens   Amount of cleaning:  Standard   Irrigation solution:  Sterile saline   Irrigation method:  Pressure wash   Visualized foreign bodies/material removed: no   Skin repair:    Repair method:  Sutures   Suture size:  5-0   Suture material:  Prolene   Suture technique:  Simple interrupted   Number of sutures:  5 Approximation:    Approximation:  Close Post-procedure details:    Dressing:  Antibiotic ointment and bulky dressing   Patient tolerance of procedure:  Tolerated well, no immediate complications   (including critical  care time)  Medications Ordered in UC Medications  lidocaine-EPINEPHrine-tetracaine (LET) solution (3 mLs Topical Given 01/23/19 1425)    Initial Impression / Assessment and Plan / UC Course  I have reviewed the triage vital signs and the nursing notes.  Pertinent labs & imaging results that were available during my care of the patient were reviewed by me and considered in my medical decision making (see chart for details).     Patient tolerated procedure well. 5 sutures placed. Wound care instructions given. Return precautions given. Otherwise, follow up in 5 days for suture removal. Patient expresses understanding and agrees to plan.   Final Clinical Impressions(s) / UC Diagnoses   Final diagnoses:  Laceration of right ear lobe, initial encounter    ED Prescriptions    None        Ok Edwards, PA-C 01/23/19 2030

## 2019-01-28 ENCOUNTER — Encounter (HOSPITAL_COMMUNITY): Payer: Self-pay | Admitting: Emergency Medicine

## 2019-01-28 ENCOUNTER — Ambulatory Visit (HOSPITAL_COMMUNITY)
Admission: EM | Admit: 2019-01-28 | Discharge: 2019-01-28 | Disposition: A | Discharge status: Home / Self Care | Payer: BLUE CROSS/BLUE SHIELD

## 2019-01-28 ENCOUNTER — Other Ambulatory Visit: Payer: Self-pay

## 2019-01-28 DIAGNOSIS — Z4802 Encounter for removal of sutures: Secondary | ICD-10-CM

## 2019-01-28 NOTE — ED Triage Notes (Signed)
Pt here for suture removal to right ear; 5 sutures removed and wound approximated

## 2019-01-31 ENCOUNTER — Encounter: Payer: BLUE CROSS/BLUE SHIELD | Admitting: Internal Medicine

## 2019-02-02 ENCOUNTER — Other Ambulatory Visit: Payer: Self-pay | Admitting: Internal Medicine

## 2019-02-02 NOTE — Telephone Encounter (Signed)
Last filled 01-05-19 #90 Last OV 07-06-18 Next OV 02-03-19 [facility] [address]

## 2019-02-03 ENCOUNTER — Ambulatory Visit: Payer: BLUE CROSS/BLUE SHIELD | Admitting: Internal Medicine

## 2019-02-14 ENCOUNTER — Ambulatory Visit (INDEPENDENT_AMBULATORY_CARE_PROVIDER_SITE_OTHER): Payer: BLUE CROSS/BLUE SHIELD | Admitting: Internal Medicine

## 2019-02-14 ENCOUNTER — Encounter: Payer: Self-pay | Admitting: Internal Medicine

## 2019-02-14 VITALS — Ht 67.0 in | Wt 190.0 lb

## 2019-02-14 DIAGNOSIS — I10 Essential (primary) hypertension: Secondary | ICD-10-CM | POA: Diagnosis not present

## 2019-02-14 DIAGNOSIS — N183 Chronic kidney disease, stage 3 unspecified: Secondary | ICD-10-CM

## 2019-02-14 DIAGNOSIS — Z0000 Encounter for general adult medical examination without abnormal findings: Secondary | ICD-10-CM | POA: Diagnosis not present

## 2019-02-14 DIAGNOSIS — F39 Unspecified mood [affective] disorder: Secondary | ICD-10-CM | POA: Diagnosis not present

## 2019-02-14 MED ORDER — CHLORTHALIDONE 25 MG PO TABS: 25.0000 mg | ORAL_TABLET | Freq: Every day | ORAL | 3 refills | Status: DC

## 2019-02-14 NOTE — Progress Notes (Signed)
Subjective:    Patient ID: Sheri [name], female    DOB: July 02, 1951, 68 y.o.   MRN: [id number]  HPI Virtual visit for yearly preventative exam and follow up of chronic health conditions Identification done Reviewed billing and she gave consent She is in her home, and I am at my office  Work has slowed down---only 3 days per week most of April and May This is rough for her finanaces  Stopped the lisinopril and losartan---read the papers and was concerned it was associated with cancers (not sure what) Thinks her BP was about 150/60  Has been having some headaches Wonders if it is related to the pinched nerves in her neck Some numbness down her left arm Happening every day Heat does help---discussed (also will try some aspirin)  Current Outpatient Medications on File Prior to Visit  Medication Sig Dispense Refill  . aspirin 81 MG tablet Take 81 mg by mouth daily.    Marland Kitchen atorvastatin (LIPITOR) 20 MG tablet TAKE 1 TABLET(20 MG) BY MOUTH DAILY 90 tablet 3  . BIOTIN PO Take by mouth.    . Calcium Carbonate-Vitamin D (CALCIUM 500 + D) 500-125 MG-UNIT TABS Take by mouth.    . [facility] PAIN RELIEF 500 MG tablet TAKE 1 TABLET EVERY 4 TO 6 HOURS AS NEEDED  0  . cyclobenzaprine (FLEXERIL) 10 MG tablet TAKE 1 TABLET(10 MG) BY MOUTH AT BEDTIME AS NEEDED FOR MUSCLE SPASMS 30 tablet 1  . escitalopram (LEXAPRO) 20 MG tablet TAKE 1 TABLET(20 MG) BY MOUTH DAILY 30 tablet 0  . traMADol (ULTRAM) 50 MG tablet TAKE 1 TABLET(50 MG) BY MOUTH THREE TIMES DAILY 90 tablet 0  . triamcinolone cream (KENALOG) 0.1 % Apply 1 application topically 2 (two) times daily as needed. (Patient not taking: Reported on 02/14/2019) 45 g 1   No current facility-administered medications on file prior to visit.     Allergies  Allergen Reactions  . Codeine Nausea And Vomiting    Past Medical History:  Diagnosis Date  . Allergy   . GERD (gastroesophageal reflux disease)   . Hypertension   . IBS (irritable bowel syndrome)    . Nocturia   . Personal history of colonic adenomas 10/09/2013  . Wrist fracture, left 1/14    Past Surgical History:  Procedure Laterality Date  . ABDOMINAL HYSTERECTOMY    . CARPAL TUNNEL RELEASE  1982   rt  . CARPAL TUNNEL RELEASE Right 09/12/2013   Procedure: RIGHT CARPAL TUNNEL RELEASE;  Surgeon: Cammie Sickle., MD;  Location: [facility];  Service: Orthopedics;  Laterality: Right;  . COLONOSCOPY  2015, 2018  . DORSAL COMPARTMENT RELEASE Left 03/16/2013   Procedure: LEFT FIRST RELEASE DORSAL COMPARTMENT (DEQUERVAIN) EXCISION OF CYST  A1 PULLEY LEFT THUMB;  Surgeon: Cammie Sickle., MD;  Location: [facility];  Service: Orthopedics;  Laterality: Left;  . ESOPHAGOGASTRODUODENOSCOPY (EGD) WITH ESOPHAGEAL DILATION  03/2017   ring  . TONSILLECTOMY    . TUBAL LIGATION      Family History  Problem Relation Age of Onset  . Dementia Mother        Lewy body  . Hypertension Sister   . Cancer Sister        breast  . Colon cancer Neg Hx   . Esophageal cancer Neg Hx   . Rectal cancer Neg Hx   . Stomach cancer Neg Hx   . Heart disease Neg Hx     Social History  Socioeconomic History  . Marital status: Divorced    Spouse name: Not on file  . Number of children: 2  . Years of education: 33  . Highest education level: Not on file  Occupational History  . Occupation: Stock returns Scientist, clinical (histocompatibility and immunogenetics)    Comment: Medi  Social Needs  . Financial resource strain: Not on file  . Food insecurity:    Worry: Not on file    Inability: Not on file  . Transportation needs:    Medical: Not on file    Non-medical: Not on file  Tobacco Use  . Smoking status: Former Smoker    Types: Cigarettes    Last attempt to quit: 10/13/2002    Years since quitting: 16.3  . Smokeless tobacco: Never Used  Substance and Sexual Activity  . Alcohol use: No  . Drug use: No  . Sexual activity: Never  Lifestyle  . Physical activity:    Days per week: Not on file    Minutes  per session: Not on file  . Stress: Not on file  Relationships  . Social connections:    Talks on phone: Not on file    Gets together: Not on file    Attends religious service: Not on file    Active member of club or organization: Not on file    Attends meetings of clubs or organizations: Not on file    Relationship status: Not on file  . Intimate partner violence:    Fear of current or ex partner: Not on file    Emotionally abused: Not on file    Physically abused: Not on file    Forced sexual activity: Not on file  Other Topics Concern  . 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 Married '73 - 18 yr/divorced. 1 dtr - '82, 1 son - '73; 1 grandchild.    Lives in her own place   Review of Systems  Constitutional:       Disturbed by weight gain without more eating Wears seat belt  HENT: Positive for tinnitus. Negative for hearing loss.        Recent fillings--may need extraction and partial soon  Eyes: Negative for visual disturbance.       No diplopia or unilateral vision loss Glasses just for reading Glaucoma suspect  Respiratory: Negative for shortness of breath.        Occ cough--if swallows wrong  Cardiovascular: Negative for chest pain and palpitations.       May have slight ankle swelling at times  Gastrointestinal: Negative for blood in stool and constipation.       Just some left flank pain--intermittent  Endocrine: Negative for polydipsia and polyuria.  Genitourinary: Negative for dysuria and hematuria.  Musculoskeletal: Positive for arthralgias and neck pain. Negative for joint swelling.  Skin: Negative for rash.  Allergic/Immunologic: Negative for environmental allergies and immunocompromised state.  Neurological: Positive for headaches. Negative for syncope and light-headedness.       Can feel dizzy at work if very hot---brief  Hematological: Negative for adenopathy. Does not bruise/bleed easily.  Psychiatric/Behavioral: Positive for  dysphoric mood. The patient is not nervous/anxious.        Not sleeping as well recently--- usually 3 hours at a time Feels her mood is better---some depression but not persistent Stopped the escitalopram No suicidal thoughts       Objective:   Physical Exam  Constitutional: She is oriented to person, place, and time. She appears  well-developed. No distress.  Respiratory: Effort normal. No respiratory distress.  Musculoskeletal:        General: No edema.  Neurological: She is alert and oriented to person, place, and time.  Psychiatric: She has a normal mood and affect.           Assessment & Plan:

## 2019-02-14 NOTE — Assessment & Plan Note (Signed)
BP Readings from Last 3 Encounters:  07/06/18 (!) 148/88  05/06/18 110/66  03/03/18 124/60   Stopped lisinopril due to concerns about her hair loss (and this is better now) Didn't want losartan Will try chlorthalidone

## 2019-02-14 NOTE — Assessment & Plan Note (Signed)
Still has mostly reactive depression ---not MDD Stopped the escitalopram with concerns of side effects---will hold off on other Rx

## 2019-02-14 NOTE — Assessment & Plan Note (Signed)
Discussed lifestyle--especially in view of her weight gain Colon due 2023 Just had mammogram--she will bring in the report at follow up Will give prevnar at her follow up in 1 month Flu vaccine in the fall Consider shingrix

## 2019-02-14 NOTE — Assessment & Plan Note (Signed)
Wouldn't take the ACEI after reading about side effects and noting hair loss (or the ARB) Will monitor this

## 2019-03-03 ENCOUNTER — Other Ambulatory Visit: Payer: Self-pay | Admitting: Internal Medicine

## 2019-03-03 NOTE — Telephone Encounter (Signed)
Both last filled 02-02-19 Cyclobenzaprine #30 Tramadol #90 [facility] [address]

## 2019-03-11 ENCOUNTER — Other Ambulatory Visit: Payer: Self-pay | Admitting: Internal Medicine

## 2019-03-20 DIAGNOSIS — H40013 Open angle with borderline findings, low risk, bilateral: Secondary | ICD-10-CM | POA: Diagnosis not present

## 2019-03-23 ENCOUNTER — Ambulatory Visit: Payer: BC Managed Care – PPO | Admitting: Internal Medicine

## 2019-03-23 ENCOUNTER — Encounter: Payer: Self-pay | Admitting: Internal Medicine

## 2019-03-23 ENCOUNTER — Other Ambulatory Visit: Payer: Self-pay

## 2019-03-23 VITALS — BP 126/78 | HR 86 | Temp 97.2°F | Wt 195.5 lb

## 2019-03-23 DIAGNOSIS — Z23 Encounter for immunization: Secondary | ICD-10-CM

## 2019-03-23 DIAGNOSIS — E669 Obesity, unspecified: Secondary | ICD-10-CM | POA: Diagnosis not present

## 2019-03-23 DIAGNOSIS — I10 Essential (primary) hypertension: Secondary | ICD-10-CM

## 2019-03-23 DIAGNOSIS — E66811 Obesity, class 1: Secondary | ICD-10-CM | POA: Insufficient documentation

## 2019-03-23 DIAGNOSIS — N183 Chronic kidney disease, stage 3 unspecified: Secondary | ICD-10-CM

## 2019-03-23 NOTE — Patient Instructions (Signed)

## 2019-03-23 NOTE — Progress Notes (Signed)
Subjective:    Patient ID: Sheri [name], female    DOB: November 18, 1950, 69 y.o.   MRN: [id number]  HPI Here for follow up of change in BP medications  Has done well with the new medication No chest pain  No SOB No dizziness or syncope Mild edema in right calf  Has tried vinegar, hydroxycut, etc for her weight Wonders about something for weight Does some exercise---weights, sit ups  Work is still intermittent  Current Outpatient Medications on File Prior to Visit  Medication Sig Dispense Refill  . aspirin 81 MG tablet Take 81 mg by mouth daily.    Marland Kitchen atorvastatin (LIPITOR) 20 MG tablet TAKE 1 TABLET(20 MG) BY MOUTH DAILY 90 tablet 3  . BIOTIN PO Take by mouth.    . Calcium Carbonate-Vitamin D (CALCIUM 500 + D) 500-125 MG-UNIT TABS Take by mouth.    . chlorthalidone (HYGROTON) 25 MG tablet Take 1 tablet (25 mg total) by mouth daily. 90 tablet 3  . [facility] PAIN RELIEF 500 MG tablet TAKE 1 TABLET EVERY 4 TO 6 HOURS AS NEEDED  0  . cyclobenzaprine (FLEXERIL) 10 MG tablet TAKE 1 TABLET(10 MG) BY MOUTH AT BEDTIME AS NEEDED FOR MUSCLE SPASMS 30 tablet 1  . gabapentin (NEURONTIN) 300 MG capsule Take 300 mg by mouth daily as needed.    . traMADol (ULTRAM) 50 MG tablet TAKE 1 TABLET(50 MG) BY MOUTH THREE TIMES DAILY 90 tablet 0  . triamcinolone cream (KENALOG) 0.1 % Apply 1 application topically 2 (two) times daily as needed. 45 g 1   No current facility-administered medications on file prior to visit.     Allergies  Allergen Reactions  . Codeine Nausea And Vomiting    Past Medical History:  Diagnosis Date  . Allergy   . GERD (gastroesophageal reflux disease)   . Hypertension   . IBS (irritable bowel syndrome)   . Nocturia   . Personal history of colonic adenomas 10/09/2013  . Wrist fracture, left 1/14    Past Surgical History:  Procedure Laterality Date  . ABDOMINAL HYSTERECTOMY    . CARPAL TUNNEL RELEASE  1982   rt  . CARPAL TUNNEL RELEASE Right 09/12/2013   Procedure: RIGHT  CARPAL TUNNEL RELEASE;  Surgeon: Cammie Sickle., MD;  Location: [facility];  Service: Orthopedics;  Laterality: Right;  . COLONOSCOPY  2015, 2018  . DORSAL COMPARTMENT RELEASE Left 03/16/2013   Procedure: LEFT FIRST RELEASE DORSAL COMPARTMENT (DEQUERVAIN) EXCISION OF CYST  A1 PULLEY LEFT THUMB;  Surgeon: Cammie Sickle., MD;  Location: [facility];  Service: Orthopedics;  Laterality: Left;  . ESOPHAGOGASTRODUODENOSCOPY (EGD) WITH ESOPHAGEAL DILATION  03/2017   ring  . TONSILLECTOMY    . TUBAL LIGATION      Family History  Problem Relation Age of Onset  . Dementia Mother        Lewy body  . Hypertension Sister   . Cancer Sister        breast  . Colon cancer Neg Hx   . Esophageal cancer Neg Hx   . Rectal cancer Neg Hx   . Stomach cancer Neg Hx   . Heart disease Neg Hx     Social History   Socioeconomic History  . Marital status: Divorced    Spouse name: Not on file  . Number of children: 2  . Years of education: 69  . Highest education level: Not on file  Occupational History  . Occupation: Stock returns  clerk    Comment: Medi  Social Needs  . Financial resource strain: Not on file  . Food insecurity    Worry: Not on file    Inability: Not on file  . Transportation needs    Medical: Not on file    Non-medical: Not on file  Tobacco Use  . Smoking status: Former Smoker    Types: Cigarettes    Quit date: 10/13/2002    Years since quitting: 16.4  . Smokeless tobacco: Never Used  Substance and Sexual Activity  . Alcohol use: No  . Drug use: No  . Sexual activity: Never  Lifestyle  . Physical activity    Days per week: Not on file    Minutes per session: Not on file  . Stress: Not on file  Relationships  . Social Herbalist on phone: Not on file    Gets together: Not on file    Attends religious service: Not on file    Active member of club or organization: Not on file    Attends meetings of clubs or organizations:  Not on file    Relationship status: Not on file  . Intimate partner violence    Fear of current or ex partner: Not on file    Emotionally abused: Not on file    Physically abused: Not on file    Forced sexual activity: Not on file  Other Topics Concern  . 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 Married '73 - 18 yr/divorced. 1 dtr - '82, 1 son - '73; 1 grandchild.    Lives in her own place   Review of Systems Weight up a few pounds Sleeps is still not great---only 3-4 hours at a time    Objective:   Physical Exam  Constitutional: She appears well-developed. No distress.  Neck: No thyromegaly present.  Cardiovascular: Normal rate, regular rhythm and normal heart sounds. Exam reveals no gallop.  No murmur heard. Respiratory: Effort normal and breath sounds normal. No respiratory distress. She has no wheezes. She has no rales.  Musculoskeletal:        General: No tenderness or edema.  Lymphadenopathy:    She has no cervical adenopathy.  Psychiatric: She has a normal mood and affect. Her behavior is normal.           Assessment & Plan:

## 2019-03-23 NOTE — Assessment & Plan Note (Signed)
Will recheck labs 

## 2019-03-23 NOTE — Addendum Note (Signed)
Addended by: Tammi Sou on: 03/23/2019 03:29 PM   Modules accepted: Orders

## 2019-03-23 NOTE — Assessment & Plan Note (Signed)
BP Readings from Last 3 Encounters:  03/23/19 126/78  07/06/18 (!) 148/88  05/06/18 110/66   Good response with chlorthalidone Will check labs Some edema at times--discussed support hose prn when on her feet at work

## 2019-03-23 NOTE — Assessment & Plan Note (Signed)
Weight up more Discussed avoiding snacking and stop cranberry juice

## 2019-03-24 LAB — HEPATIC FUNCTION PANEL
AG Ratio: 1.8 (calc) (ref 1.0–2.5)
ALT: 21 U/L (ref 6–29)
AST: 21 U/L (ref 10–35)
Albumin: 4.7 g/dL (ref 3.6–5.1)
Alkaline phosphatase (APISO): 77 U/L (ref 37–153)
Bilirubin, Direct: 0.1 mg/dL (ref 0.0–0.2)
Globulin: 2.6 g/dL (calc) (ref 1.9–3.7)
Indirect Bilirubin: 0.2 mg/dL (calc) (ref 0.2–1.2)
Total Bilirubin: 0.3 mg/dL (ref 0.2–1.2)
Total Protein: 7.3 g/dL (ref 6.1–8.1)

## 2019-03-24 LAB — RENAL FUNCTION PANEL
Albumin: 4.7 g/dL (ref 3.6–5.1)
BUN/Creatinine Ratio: 11 (calc) (ref 6–22)
BUN: 25 mg/dL (ref 7–25)
CO2: 29 mmol/L (ref 20–32)
Calcium: 10.4 mg/dL (ref 8.6–10.4)
Chloride: 102 mmol/L (ref 98–110)
Creat: 2.23 mg/dL — ABNORMAL HIGH (ref 0.50–0.99)
Glucose, Bld: 108 mg/dL — ABNORMAL HIGH (ref 65–99)
Phosphorus: 4.1 mg/dL (ref 2.1–4.3)
Potassium: 4.5 mmol/L (ref 3.5–5.3)
Sodium: 142 mmol/L (ref 135–146)

## 2019-03-24 LAB — CBC
HCT: 33.1 % — ABNORMAL LOW (ref 35.0–45.0)
Hemoglobin: 10.9 g/dL — ABNORMAL LOW (ref 11.7–15.5)
MCH: 29.7 pg (ref 27.0–33.0)
MCHC: 32.9 g/dL (ref 32.0–36.0)
MCV: 90.2 fL (ref 80.0–100.0)
MPV: 10.2 fL (ref 7.5–12.5)
Platelets: 278 10*3/uL (ref 140–400)
RBC: 3.67 10*6/uL — ABNORMAL LOW (ref 3.80–5.10)
RDW: 13.8 % (ref 11.0–15.0)
WBC: 8.3 10*3/uL (ref 3.8–10.8)

## 2019-03-24 LAB — T4, FREE: Free T4: 1.1 ng/dL (ref 0.8–1.8)

## 2019-03-25 ENCOUNTER — Other Ambulatory Visit: Payer: Self-pay | Admitting: Internal Medicine

## 2019-03-25 MED ORDER — AMLODIPINE BESYLATE 2.5 MG PO TABS: 2.5000 mg | ORAL_TABLET | Freq: Every day | ORAL | 3 refills | Status: DC

## 2019-03-31 ENCOUNTER — Other Ambulatory Visit: Payer: Self-pay | Admitting: Internal Medicine

## 2019-03-31 NOTE — Telephone Encounter (Signed)
Last filled 03-03-19 #90 Last OV 03-23-19 Next OV 04-27-19 [facility] [address]

## 2019-04-27 ENCOUNTER — Encounter: Payer: Self-pay | Admitting: Internal Medicine

## 2019-04-27 ENCOUNTER — Ambulatory Visit: Payer: BC Managed Care – PPO | Admitting: Internal Medicine

## 2019-04-27 ENCOUNTER — Other Ambulatory Visit: Payer: Self-pay

## 2019-04-27 VITALS — BP 124/70 | HR 69 | Temp 97.9°F | Ht 67.0 in | Wt 191.0 lb

## 2019-04-27 DIAGNOSIS — N183 Chronic kidney disease, stage 3 unspecified: Secondary | ICD-10-CM

## 2019-04-27 DIAGNOSIS — K580 Irritable bowel syndrome with diarrhea: Secondary | ICD-10-CM

## 2019-04-27 DIAGNOSIS — I10 Essential (primary) hypertension: Secondary | ICD-10-CM

## 2019-04-27 MED ORDER — TRAMADOL HCL 50 MG PO TABS: 50.0000 mg | ORAL_TABLET | Freq: Three times a day (TID) | ORAL | 0 refills | Status: DC | PRN

## 2019-04-27 NOTE — Assessment & Plan Note (Signed)
Hopefully will be back to baseline off the chlorthalidone

## 2019-04-27 NOTE — Assessment & Plan Note (Signed)
BP Readings from Last 3 Encounters:  04/27/19 124/70  03/23/19 126/78  07/06/18 (!) 148/88   Good control on the low dose amlodipine

## 2019-04-27 NOTE — Progress Notes (Signed)
Subjective:    Patient ID: Sheri [name], female    DOB: 1951-09-03, 68 y.o.   MRN: [id number]  HPI Here for follow up of HTN and CKD Renal function declined on the chlorthalidone--so we changed it to  Amlodipine  She has tolerated that fine Hasn't checked BP No headaches Slight edema on right yesterday--wears compression socks at times  Current Outpatient Medications on File Prior to Visit  Medication Sig Dispense Refill  . amLODipine (NORVASC) 2.5 MG tablet Take 1 tablet (2.5 mg total) by mouth daily. 90 tablet 3  . aspirin 81 MG tablet Take 81 mg by mouth daily.    Marland Kitchen atorvastatin (LIPITOR) 20 MG tablet TAKE 1 TABLET(20 MG) BY MOUTH DAILY 90 tablet 3  . BIOTIN PO Take by mouth.    . Calcium Carbonate-Vitamin D (CALCIUM 500 + D) 500-125 MG-UNIT TABS Take by mouth.    . [facility] PAIN RELIEF 500 MG tablet TAKE 1 TABLET EVERY 4 TO 6 HOURS AS NEEDED  0  . cyclobenzaprine (FLEXERIL) 10 MG tablet TAKE 1 TABLET(10 MG) BY MOUTH AT BEDTIME AS NEEDED FOR MUSCLE SPASMS 30 tablet 1  . gabapentin (NEURONTIN) 300 MG capsule Take 300 mg by mouth daily as needed.    . traMADol (ULTRAM) 50 MG tablet TAKE 1 TABLET(50 MG) BY MOUTH THREE TIMES DAILY 90 tablet 0  . triamcinolone cream (KENALOG) 0.1 % Apply 1 application topically 2 (two) times daily as needed. 45 g 1   No current facility-administered medications on file prior to visit.     Allergies  Allergen Reactions  . Codeine Nausea And Vomiting    Past Medical History:  Diagnosis Date  . Allergy   . GERD (gastroesophageal reflux disease)   . Hypertension   . IBS (irritable bowel syndrome)   . Nocturia   . Personal history of colonic adenomas 10/09/2013  . Wrist fracture, left 1/14    Past Surgical History:  Procedure Laterality Date  . ABDOMINAL HYSTERECTOMY    . CARPAL TUNNEL RELEASE  1982   rt  . CARPAL TUNNEL RELEASE Right 09/12/2013   Procedure: RIGHT CARPAL TUNNEL RELEASE;  Surgeon: Cammie Sickle., MD;  Location: [facility];  Service: Orthopedics;  Laterality: Right;  . COLONOSCOPY  2015, 2018  . DORSAL COMPARTMENT RELEASE Left 03/16/2013   Procedure: LEFT FIRST RELEASE DORSAL COMPARTMENT (DEQUERVAIN) EXCISION OF CYST  A1 PULLEY LEFT THUMB;  Surgeon: Cammie Sickle., MD;  Location: [facility];  Service: Orthopedics;  Laterality: Left;  . ESOPHAGOGASTRODUODENOSCOPY (EGD) WITH ESOPHAGEAL DILATION  03/2017   ring  . TONSILLECTOMY    . TUBAL LIGATION      Family History  Problem Relation Age of Onset  . Dementia Mother        Lewy body  . Hypertension Sister   . Cancer Sister        breast  . Colon cancer Neg Hx   . Esophageal cancer Neg Hx   . Rectal cancer Neg Hx   . Stomach cancer Neg Hx   . Heart disease Neg Hx     Social History   Socioeconomic History  . Marital status: Divorced    Spouse name: Not on file  . Number of children: 2  . Years of education: 58  . Highest education level: Not on file  Occupational History  . Occupation: Stock returns Scientist, clinical (histocompatibility and immunogenetics)    Comment: Medi  Social Needs  . Financial resource strain: Not on  file  . Food insecurity    Worry: Not on file    Inability: Not on file  . Transportation needs    Medical: Not on file    Non-medical: Not on file  Tobacco Use  . Smoking status: Former Smoker    Types: Cigarettes    Quit date: 10/13/2002    Years since quitting: 16.5  . Smokeless tobacco: Never Used  Substance and Sexual Activity  . Alcohol use: No  . Drug use: No  . Sexual activity: Never  Lifestyle  . Physical activity    Days per week: Not on file    Minutes per session: Not on file  . Stress: Not on file  Relationships  . Social Herbalist on phone: Not on file    Gets together: Not on file    Attends religious service: Not on file    Active member of club or organization: Not on file    Attends meetings of clubs or organizations: Not on file    Relationship status: Not on file  . Intimate partner  violence    Fear of current or ex partner: Not on file    Emotionally abused: Not on file    Physically abused: Not on file    Forced sexual activity: Not on file  Other Topics Concern  . 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 Married '73 - 18 yr/divorced. 1 dtr - '82, 1 son - '73; 1 grandchild.    Lives in her own place   Review of Systems No chest pain No SOB Appetite is okay Ongoing IBS---causes bloating (asks about trying IB Guard------okay) Weight down slightly--trying    Objective:   Physical Exam  Constitutional: She appears well-developed. No distress.  Cardiovascular: Normal rate, regular rhythm and normal heart sounds. Exam reveals no gallop.  No murmur heard. Respiratory: Effort normal and breath sounds normal. No respiratory distress. She has no wheezes. She has no rales.  GI: Soft.  Slight bloating Mild lower quadrant tenderness  Psychiatric: She has a normal mood and affect. Her behavior is normal.           Assessment & Plan:

## 2019-04-27 NOTE — Assessment & Plan Note (Signed)
Worse lately Discussed the fruit she has increased Okay to try IB Guard prn

## 2019-04-28 LAB — RENAL FUNCTION PANEL
Albumin: 4.7 g/dL (ref 3.5–5.2)
BUN: 34 mg/dL — ABNORMAL HIGH (ref 6–23)
CO2: 27 mEq/L (ref 19–32)
Calcium: 9.6 mg/dL (ref 8.4–10.5)
Chloride: 103 mEq/L (ref 96–112)
Creatinine, Ser: 1.7 mg/dL — ABNORMAL HIGH (ref 0.40–1.20)
GFR: 36.43 mL/min — ABNORMAL LOW (ref >60.00)
Glucose, Bld: 107 mg/dL — ABNORMAL HIGH (ref 70–99)
Phosphorus: 4.5 mg/dL (ref 2.3–4.6)
Potassium: 4 mEq/L (ref 3.5–5.1)
Sodium: 140 mEq/L (ref 135–145)

## 2019-05-04 ENCOUNTER — Other Ambulatory Visit: Payer: Self-pay | Admitting: Internal Medicine

## 2019-05-04 NOTE — Addendum Note (Signed)
Addended by: Pilar Grammes on: 05/04/2019 09:09 AM   Modules accepted: Orders

## 2019-05-04 NOTE — Telephone Encounter (Signed)
Called pharmacy to void the valsartan rx. It was not supposed to go to the pharmacy.

## 2019-05-25 ENCOUNTER — Other Ambulatory Visit: Payer: Self-pay | Admitting: Internal Medicine

## 2019-05-25 NOTE — Telephone Encounter (Signed)
Last filled 04-27-19 #90 Last OV 04-27-19 Next OV 03-26-20 [place]

## 2019-05-25 NOTE — Telephone Encounter (Signed)
Pt is calling inquiring about Tramadol. She was trying to avoid two trips.   [place] [phone]

## 2019-06-22 ENCOUNTER — Other Ambulatory Visit: Payer: Self-pay | Admitting: Internal Medicine

## 2019-06-22 NOTE — Telephone Encounter (Signed)
Last filled 05-25-19 #90 Last OV 04-27-19 Next OV 03-26-20 [facility] [address]

## 2019-07-21 ENCOUNTER — Other Ambulatory Visit: Payer: Self-pay

## 2019-07-21 MED ORDER — TRAMADOL HCL 50 MG PO TABS: ORAL_TABLET | ORAL | 0 refills | Status: DC

## 2019-07-21 NOTE — Telephone Encounter (Signed)
Last filled 06-22-19 #90 Last OV 04-27-19 Next OV 03-26-20 [place]

## 2019-08-16 ENCOUNTER — Other Ambulatory Visit: Payer: Self-pay | Admitting: Internal Medicine

## 2019-08-16 MED ORDER — CYCLOBENZAPRINE HCL 10 MG PO TABS: ORAL_TABLET | ORAL | 0 refills | Status: DC

## 2019-08-16 MED ORDER — TRAMADOL HCL 50 MG PO TABS: ORAL_TABLET | ORAL | 0 refills | Status: DC

## 2019-08-16 NOTE — Telephone Encounter (Signed)
Tramadol last filled 07-21-19. Will be due Sunday the 29th. Last OV 04-27-19 Next OV 03-26-20

## 2019-08-16 NOTE — Addendum Note (Signed)
Addended by: Lurlean Nanny on: 08/16/2019 04:45 PM   Modules accepted: Orders

## 2019-08-16 NOTE — Telephone Encounter (Signed)
Patient called to get refills on Cyclobenzaprine,Atorvastatin, and Tramadol.  Patient lost her job and her insurance runs out on 08/21/19.  [facility] uses [facility]

## 2019-08-18 MED ORDER — TRAMADOL HCL 50 MG PO TABS: ORAL_TABLET | ORAL | 0 refills | Status: DC

## 2019-09-01 ENCOUNTER — Other Ambulatory Visit: Payer: Self-pay | Admitting: Internal Medicine

## 2019-09-13 ENCOUNTER — Other Ambulatory Visit: Payer: Self-pay | Admitting: Internal Medicine

## 2019-09-13 NOTE — Telephone Encounter (Signed)
Tramadol last filled 08-18-2019. Note to be filled on or after 09/17/2019...  Last OV 04-27-19 Next OV 03-26-20

## 2019-09-18 MED ORDER — CYCLOBENZAPRINE HCL 10 MG PO TABS: ORAL_TABLET | ORAL | 0 refills | Status: DC

## 2019-09-18 NOTE — Telephone Encounter (Signed)
Spoke to pt. He says she actually spoke to someone at the pharmacy. I will call them.

## 2019-09-18 NOTE — Addendum Note (Signed)
Addended by: Viviana Simpler I on: 09/18/2019 01:54 PM   Modules accepted: Orders

## 2019-09-18 NOTE — Telephone Encounter (Signed)
Patient called the pharmacy and they stated they did not receive a refill for the Tramadol.   She also stated that a request for CYCLOBENZAPRINE should have been sent over to [place] also. She is completely out of medication

## 2019-09-18 NOTE — Telephone Encounter (Addendum)
Spoke to pharmacy. They have the tramadol rx and will fill it.

## 2019-09-18 NOTE — Addendum Note (Signed)
Addended by: Pilar Grammes on: 09/18/2019 10:14 AM   Modules accepted: Orders

## 2019-10-18 ENCOUNTER — Other Ambulatory Visit: Payer: Self-pay

## 2019-10-18 MED ORDER — TRAMADOL HCL 50 MG PO TABS: 50.0000 mg | ORAL_TABLET | Freq: Three times a day (TID) | ORAL | 0 refills | Status: DC | PRN

## 2019-10-18 MED ORDER — CYCLOBENZAPRINE HCL 10 MG PO TABS: 5.0000 mg | ORAL_TABLET | Freq: Every evening | ORAL | 0 refills | Status: DC | PRN

## 2019-10-18 NOTE — Telephone Encounter (Signed)
Tramadol last filled 09-13-19 #90 Cyclobenzaprine last filled 09-18-19 #30 Last OV 04-27-19 Next OV 03-26-20 [facility] [place]

## 2019-11-14 ENCOUNTER — Other Ambulatory Visit: Payer: Self-pay

## 2019-11-14 NOTE — Telephone Encounter (Signed)
Last filled 10-18-19 #30 Last OV 04-27-19 Next OV 03-26-20 [facility] [address] [place]

## 2019-11-15 MED ORDER — CYCLOBENZAPRINE HCL 10 MG PO TABS: 5.0000 mg | ORAL_TABLET | Freq: Every evening | ORAL | 0 refills | Status: DC | PRN

## 2019-11-17 ENCOUNTER — Other Ambulatory Visit: Payer: Self-pay

## 2019-11-17 NOTE — Telephone Encounter (Signed)
Tramadol last filled 10-18-19 #90 Cyclobenzaprine last filled 09-18-19 #30 Last OV 04-27-19 Next OV 03-26-20 [facility] [place]

## 2019-11-18 MED ORDER — TRAMADOL HCL 50 MG PO TABS: 50.0000 mg | ORAL_TABLET | Freq: Three times a day (TID) | ORAL | 0 refills | Status: DC | PRN

## 2019-12-04 ENCOUNTER — Telehealth: Payer: Self-pay | Admitting: Internal Medicine

## 2019-12-04 ENCOUNTER — Other Ambulatory Visit: Payer: Self-pay | Admitting: Internal Medicine

## 2019-12-04 MED ORDER — VALSARTAN-HYDROCHLOROTHIAZIDE 320-25 MG PO TABS: 1.0000 | ORAL_TABLET | Freq: Every day | ORAL | 1 refills | Status: DC

## 2019-12-04 NOTE — Telephone Encounter (Signed)
I went back in the medication history and this was stopped in August. She filled it 08-02-19. She has been taking it. Still taking the amlodipine. The valsartan/hctz was not on med list in August 2020. She is not having any issues with the medication.

## 2019-12-04 NOTE — Telephone Encounter (Signed)
Please check with the he and the pharmacist---has she been on this continuously since last summer (doesn't seem to have had enough refills). If she has, we should refill it---but we need her in soon for recheck and labs (would give #30 x 1)

## 2019-12-04 NOTE — Telephone Encounter (Signed)
She told me she last filled it in November for 90 days and has actively been taking it. She has an appt in July and is working on getting her Medicare set up.

## 2019-12-04 NOTE — Telephone Encounter (Signed)
Patient said she called [facility] for a refill on Valsartan/HCTZ 320 mg/25 mg. Patient's out of medication for 1-2 weeks.  Please send in medication.

## 2019-12-14 ENCOUNTER — Other Ambulatory Visit: Payer: Self-pay | Admitting: Internal Medicine

## 2019-12-14 NOTE — Telephone Encounter (Signed)
Last filled 11-15-19 #30 Last OV 04-27-19 Next OV 03-26-20 [facility] [address] [place]

## 2019-12-18 ENCOUNTER — Other Ambulatory Visit: Payer: Self-pay | Admitting: Internal Medicine

## 2019-12-18 NOTE — Telephone Encounter (Signed)
Last filled 11-18-19 #90 Last OV 04-27-19 Next OV 03-26-20 [facility] [address]

## 2020-01-13 ENCOUNTER — Other Ambulatory Visit: Payer: Self-pay | Admitting: Internal Medicine

## 2020-01-16 ENCOUNTER — Other Ambulatory Visit: Payer: Self-pay | Admitting: Internal Medicine

## 2020-01-16 NOTE — Telephone Encounter (Signed)
Last filled 12-18-19#90 Last OV 04-27-19 Next OV 03-26-20 [facility] [place]

## 2020-02-01 ENCOUNTER — Other Ambulatory Visit: Payer: Self-pay | Admitting: Internal Medicine

## 2020-02-14 ENCOUNTER — Other Ambulatory Visit: Payer: Self-pay | Admitting: Internal Medicine

## 2020-02-14 NOTE — Telephone Encounter (Signed)
Last filled4-27-21#90 Last OV 04-27-19 Next OV 03-26-20 [facility] [address]

## 2020-03-13 ENCOUNTER — Other Ambulatory Visit: Payer: Self-pay | Admitting: Internal Medicine

## 2020-03-14 ENCOUNTER — Other Ambulatory Visit: Payer: Self-pay | Admitting: Internal Medicine

## 2020-03-14 NOTE — Telephone Encounter (Signed)
Last filled5-26-21#90 Last OV 04-27-19 Next OV 03-26-20 [facility] [address]

## 2020-03-26 ENCOUNTER — Other Ambulatory Visit: Payer: Self-pay

## 2020-03-26 ENCOUNTER — Ambulatory Visit (INDEPENDENT_AMBULATORY_CARE_PROVIDER_SITE_OTHER): Payer: Self-pay | Admitting: Internal Medicine

## 2020-03-26 ENCOUNTER — Encounter: Payer: Self-pay | Admitting: Internal Medicine

## 2020-03-26 VITALS — BP 164/70 | HR 79 | Temp 98.0°F | Ht 67.0 in | Wt 205.0 lb

## 2020-03-26 DIAGNOSIS — I10 Essential (primary) hypertension: Secondary | ICD-10-CM

## 2020-03-26 DIAGNOSIS — Z23 Encounter for immunization: Secondary | ICD-10-CM

## 2020-03-26 DIAGNOSIS — F1120 Opioid dependence, uncomplicated: Secondary | ICD-10-CM | POA: Insufficient documentation

## 2020-03-26 DIAGNOSIS — Z0000 Encounter for general adult medical examination without abnormal findings: Secondary | ICD-10-CM

## 2020-03-26 DIAGNOSIS — N1832 Chronic kidney disease, stage 3b: Secondary | ICD-10-CM

## 2020-03-26 DIAGNOSIS — Z7189 Other specified counseling: Secondary | ICD-10-CM | POA: Insufficient documentation

## 2020-03-26 DIAGNOSIS — M797 Fibromyalgia: Secondary | ICD-10-CM

## 2020-03-26 DIAGNOSIS — F39 Unspecified mood [affective] disorder: Secondary | ICD-10-CM

## 2020-03-26 LAB — RENAL FUNCTION PANEL
Albumin: 4.9 g/dL (ref 3.5–5.2)
BUN: 21 mg/dL (ref 6–23)
CO2: 29 mEq/L (ref 19–32)
Calcium: 10.4 mg/dL (ref 8.4–10.5)
Chloride: 100 mEq/L (ref 96–112)
Creatinine, Ser: 1.21 mg/dL — ABNORMAL HIGH (ref 0.40–1.20)
GFR: 53.78 mL/min — ABNORMAL LOW (ref >60.00)
Glucose, Bld: 108 mg/dL — ABNORMAL HIGH (ref 70–99)
Phosphorus: 3.3 mg/dL (ref 2.3–4.6)
Potassium: 4.7 mEq/L (ref 3.5–5.1)
Sodium: 140 mEq/L (ref 135–145)

## 2020-03-26 LAB — CBC
HCT: 37.1 % (ref 36.0–46.0)
Hemoglobin: 11.9 g/dL — ABNORMAL LOW (ref 12.0–15.0)
MCHC: 32.2 g/dL (ref 30.0–36.0)
MCV: 94.3 fl (ref 78.0–100.0)
Platelets: 283 10*3/uL (ref 150.0–400.0)
RBC: 3.93 Mil/uL (ref 3.87–5.11)
RDW: 14.4 % (ref 11.5–15.5)
WBC: 6.3 10*3/uL (ref 4.0–10.5)

## 2020-03-26 LAB — LIPID PANEL
Cholesterol: 273 mg/dL — ABNORMAL HIGH (ref 0–200)
HDL: 64.7 mg/dL (ref >39.00)
NonHDL: 208.21
Total CHOL/HDL Ratio: 4
Triglycerides: 257 mg/dL — ABNORMAL HIGH (ref 0.0–149.0)
VLDL: 51.4 mg/dL — ABNORMAL HIGH (ref 0.0–40.0)

## 2020-03-26 LAB — T4, FREE: Free T4: 0.81 ng/dL (ref 0.60–1.60)

## 2020-03-26 LAB — HEPATIC FUNCTION PANEL
ALT: 24 U/L (ref 0–35)
AST: 20 U/L (ref 0–37)
Albumin: 4.9 g/dL (ref 3.5–5.2)
Alkaline Phosphatase: 77 U/L (ref 39–117)
Bilirubin, Direct: 0.1 mg/dL (ref 0.0–0.3)
Total Bilirubin: 0.3 mg/dL (ref 0.2–1.2)
Total Protein: 7.6 g/dL (ref 6.0–8.3)

## 2020-03-26 LAB — VITAMIN D 25 HYDROXY (VIT D DEFICIENCY, FRACTURES): VITD: 38.67 ng/mL (ref 30.00–100.00)

## 2020-03-26 LAB — LDL CHOLESTEROL, DIRECT: Direct LDL: 154 mg/dL

## 2020-03-26 MED ORDER — AMLODIPINE BESYLATE 5 MG PO TABS: 5.0000 mg | ORAL_TABLET | Freq: Every day | ORAL | 3 refills | Status: DC

## 2020-03-26 MED ORDER — ATORVASTATIN CALCIUM 20 MG PO TABS: ORAL_TABLET | ORAL | 3 refills | Status: DC

## 2020-03-26 MED ORDER — CYCLOBENZAPRINE HCL 10 MG PO TABS: ORAL_TABLET | ORAL | 0 refills | Status: DC

## 2020-03-26 NOTE — Progress Notes (Signed)
Subjective:    Patient ID: Sheri [name], female    DOB: 1951/04/22, 69 y.o.   MRN: [id number]  HPI Here for initial Medicare preventative exam and follow up of chronic health conditions This visit occurred during the SARS-CoV-2 public health emergency.  Safety protocols were in place, including screening questions prior to the visit, additional usage of staff PPE, and extensive cleaning of exam room while observing appropriate contact time as indicated for disinfecting solutions.   Reviewed form and advanced directives Reviewed other doctors Vision is okay---glaucoma suspect Hearing okay No falls Having mood problems Does some resistance exercise--discussed aerobic Independent with instrumental ADLs No memory issues  Was laid off---has to find another job On unemployment for now Some depressed mood---every day No suicidal ideation Cries easily Still enjoys TV and books Able to get back to church and gets out to shop  Overall, still feels well Continues to have body "hurt" every day Continues on the tramadol three times a day  Worries about her blood pressure Was been keeping track--but it has been high lately No chest pain or SOB No dizziness or syncope Gets some edema---more on right. Mild No palpitations  Continues on the cholesterol medication  Last GFR in the 30's  Current Outpatient Medications on File Prior to Visit  Medication Sig Dispense Refill  . amLODipine (NORVASC) 2.5 MG tablet TAKE 1 TABLET(2.5 MG) BY MOUTH DAILY 90 tablet 3  . aspirin 81 MG tablet Take 81 mg by mouth daily.    Marland Kitchen BIOTIN PO Take by mouth.    . [facility] PAIN RELIEF 500 MG tablet TAKE 1 TABLET EVERY 4 TO 6 HOURS AS NEEDED  0  . Cyanocobalamin (B-12 PO) Take by mouth.    . cyclobenzaprine (FLEXERIL) 10 MG tablet TAKE 1/2 TO 1 TABLET(5 TO 10 MG) BY MOUTH AT BEDTIME AS NEEDED FOR MUSCLE SPASMS 30 tablet 0  . traMADol (ULTRAM) 50 MG tablet TAKE 1 TABLET(50 MG) BY MOUTH THREE TIMES DAILY AS NEEDED  90 tablet 0  . valsartan-hydrochlorothiazide (DIOVAN-HCT) 320-25 MG tablet TAKE 1 TABLET BY MOUTH DAILY 90 tablet 3  . Vitamin D, Cholecalciferol, 10 MCG (400 UNIT) TABS Take by mouth.    Marland Kitchen atorvastatin (LIPITOR) 20 MG tablet TAKE 1 TABLET(20 MG) BY MOUTH DAILY (Patient not taking: Reported on 03/26/2020) 90 tablet 3   No current facility-administered medications on file prior to visit.    Allergies  Allergen Reactions  . Codeine Nausea And Vomiting    Past Medical History:  Diagnosis Date  . Allergy   . GERD (gastroesophageal reflux disease)   . Hypertension   . IBS (irritable bowel syndrome)   . Nocturia   . Personal history of colonic adenomas 10/09/2013  . Wrist fracture, left 1/14    Past Surgical History:  Procedure Laterality Date  . ABDOMINAL HYSTERECTOMY    . CARPAL TUNNEL RELEASE  1982   rt  . CARPAL TUNNEL RELEASE Right 09/12/2013   Procedure: RIGHT CARPAL TUNNEL RELEASE;  Surgeon: Cammie Sickle., MD;  Location: [facility];  Service: Orthopedics;  Laterality: Right;  . COLONOSCOPY  2015, 2018  . DORSAL COMPARTMENT RELEASE Left 03/16/2013   Procedure: LEFT FIRST RELEASE DORSAL COMPARTMENT (DEQUERVAIN) EXCISION OF CYST  A1 PULLEY LEFT THUMB;  Surgeon: Cammie Sickle., MD;  Location: [facility];  Service: Orthopedics;  Laterality: Left;  . ESOPHAGOGASTRODUODENOSCOPY (EGD) WITH ESOPHAGEAL DILATION  03/2017   ring  . TONSILLECTOMY    .  TUBAL LIGATION      Family History  Problem Relation Age of Onset  . Dementia Mother        Lewy body  . Hypertension Sister   . Cancer Sister        breast  . Colon cancer Neg Hx   . Esophageal cancer Neg Hx   . Rectal cancer Neg Hx   . Stomach cancer Neg Hx   . Heart disease Neg Hx     Social History   Socioeconomic History  . Marital status: Divorced    Spouse name: Not on file  . Number of children: 2  . Years of education: 65  . Highest education level: Not on file  Occupational  History  . Occupation: Stock returns Scientist, clinical (histocompatibility and immunogenetics)    Comment: [employer]  Tobacco Use  . Smoking status: Former Smoker    Types: Cigarettes    Quit date: 10/13/2002    Years since quitting: 17.4  . Smokeless tobacco: Never Used  Vaping Use  . Vaping Use: Never used  Substance and Sexual Activity  . Alcohol use: No  . Drug use: No  . Sexual activity: Never  Other Topics Concern  . 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 Married '73 - 18 yr/divorced. 1 dtr - '82, 1 son - '73; 1 grandchild.    Lives in her own place      No formal living will   Son, then daughter, should make health care POA   Would accept resuscitation   Not sure about tube feeds--but wouldn't want if cognitively unaware   Social Determinants of Health   Financial Resource Strain:   . Difficulty of Paying Living Expenses:   Food Insecurity:   . Worried About Charity fundraiser in the Last Year:   . Arboriculturist in the Last Year:   Transportation Needs:   . Film/video editor (Medical):   Marland Kitchen Lack of Transportation (Non-Medical):   Physical Activity:   . Days of Exercise per Week:   . Minutes of Exercise per Session:   Stress:   . Feeling of Stress :   Social Connections:   . Frequency of Communication with Friends and Family:   . Frequency of Social Gatherings with Friends and Family:   . Attends Religious Services:   . Active Member of Clubs or Organizations:   . Attends Archivist Meetings:   Marland Kitchen Marital Status:   Intimate Partner Violence:   . Fear of Current or Ex-Partner:   . Emotionally Abused:   Marland Kitchen Physically Abused:   . Sexually Abused:    Review of Systems Eats well---tries to be careful Very surprised about her weight gain--did comfort eat when first  Sleep is variable--cyclobenzaprine helps (but she hasn't had it for a while) Wears seat belt Trouble with teeth--looking for a new one No skin rash--occasional itching.  Occasional heartburn. Some  swallowing problems with rice (dysphagia). Uses Tums prn Bowels are regular--no blood Nocturia x 3-4. Some urgency and leakage Gets knee pain ---trying to avoid orthopedic intervention (told she needs TKR)     Objective:   Physical Exam Constitutional:      General: She is not in acute distress.    Appearance: Normal appearance.  HENT:     Head: Normocephalic and atraumatic.     Mouth/Throat:     Mouth: Mucous membranes are moist.     Comments: No lesions Cardiovascular:  Rate and Rhythm: Normal rate and regular rhythm.     Pulses: Normal pulses.     Heart sounds: No murmur heard.  No gallop.   Pulmonary:     Effort: Pulmonary effort is normal.     Breath sounds: Normal breath sounds. No wheezing or rales.  Abdominal:     Palpations: Abdomen is soft.     Tenderness: There is no abdominal tenderness.  Musculoskeletal:     Cervical back: Neck supple.     Right lower leg: No edema.     Left lower leg: No edema.  Lymphadenopathy:     Cervical: No cervical adenopathy.  Skin:    General: Skin is dry.     Findings: No rash.  Neurological:     Mental Status: She is alert and oriented to person, place, and time.     Comments: President--- "Jonna Munro, Obama----not Bush--Trump" [phone] [id number] Recall 3/3  Psychiatric:        Behavior: Behavior normal.     Comments: Mild depression but normal appearance and conversation            Assessment & Plan:

## 2020-03-26 NOTE — Progress Notes (Signed)
Hearing Screening   Method: Audiometry   125Hz  250Hz  500Hz  1000Hz  2000Hz  3000Hz  4000Hz  6000Hz  8000Hz   Right ear:   20 20 20  25     Left ear:   20 20 20  20       Visual Acuity Screening   Right eye Left eye Both eyes  Without correction: 20/25 20/50 20/30   With correction:

## 2020-03-26 NOTE — Assessment & Plan Note (Signed)
On ARB Will check labs 

## 2020-03-26 NOTE — Assessment & Plan Note (Addendum)
I have personally reviewed the Medicare Annual Wellness questionnaire and have noted 1. The patient's medical and social history 2. Their use of alcohol, tobacco or illicit drugs 3. Their current medications and supplements 4. The patient's functional ability including ADL's, fall risks, home safety risks and hearing or visual             impairment. 5. Diet and physical activities 6. Evidence for depression or mood disorders  The patients weight, height, BMI and visual acuity have been recorded in the chart I have made referrals, counseling and provided education to the patient based review of the above and I have provided the pt with a written personalized care plan for preventive services.  I have provided you with a copy of your personalized plan for preventive services. Please take the time to review along with your updated medication list.  Will give pneumovax today Flu vaccine in the fall Had 1 shingrix Discussed aerobic exercise and DASH eating Colon due 2023 She will set up mammogram

## 2020-03-26 NOTE — Assessment & Plan Note (Signed)
Chronic pain Uses the tramadol tid

## 2020-03-26 NOTE — Assessment & Plan Note (Signed)
See social history 

## 2020-03-26 NOTE — Addendum Note (Signed)
Addended by: Pilar Grammes on: 03/26/2020 03:19 PM   Modules accepted: Orders

## 2020-03-26 NOTE — Assessment & Plan Note (Signed)
Now has reactive depression -doesn't seem to be MDD Hopefully should be fine if she gets a new job

## 2020-03-26 NOTE — Patient Instructions (Addendum)
Please set up your screening mammogram.   DASH Eating Plan DASH stands for "Dietary Approaches to Stop Hypertension." The DASH eating plan is a healthy eating plan that has been shown to reduce high blood pressure (hypertension). It may also reduce your risk for type 2 diabetes, heart disease, and stroke. The DASH eating plan may also help with weight loss. What are tips for following this plan?  General guidelines  Avoid eating more than 2,300 mg (milligrams) of salt (sodium) a day. If you have hypertension, you may need to reduce your sodium intake to 1,500 mg a day.  Limit alcohol intake to no more than 1 drink a day for nonpregnant women and 2 drinks a day for men. One drink equals 12 oz of beer, 5 oz of wine, or 1 oz of hard liquor.  Work with your health care provider to maintain a healthy body weight or to lose weight. Ask what an ideal weight is for you.  Get at least 30 minutes of exercise that causes your heart to beat faster (aerobic exercise) most days of the week. Activities may include walking, swimming, or biking.  Work with your health care provider or diet and nutrition specialist (dietitian) to adjust your eating plan to your individual calorie needs. Reading food labels   Check food labels for the amount of sodium per serving. Choose foods with less than 5 percent of the Daily Value of sodium. Generally, foods with less than 300 mg of sodium per serving fit into this eating plan.  To find whole grains, look for the word "whole" as the first word in the ingredient list. Shopping  Buy products labeled as "low-sodium" or "no salt added."  Buy fresh foods. Avoid canned foods and premade or frozen meals. Cooking  Avoid adding salt when cooking. Use salt-free seasonings or herbs instead of table salt or sea salt. Check with your health care provider or pharmacist before using salt substitutes.  Do not fry foods. Cook foods using healthy methods such as baking, boiling,  grilling, and broiling instead.  Cook with heart-healthy oils, such as olive, canola, soybean, or sunflower oil. Meal planning  Eat a balanced diet that includes: ? 5 or more servings of fruits and vegetables each day. At each meal, try to fill half of your plate with fruits and vegetables. ? Up to 6-8 servings of whole grains each day. ? Less than 6 oz of lean meat, poultry, or fish each day. A 3-oz serving of meat is about the same size as a deck of cards. One egg equals 1 oz. ? 2 servings of low-fat dairy each day. ? A serving of nuts, seeds, or beans 5 times each week. ? Heart-healthy fats. Healthy fats called Omega-3 fatty acids are found in foods such as flaxseeds and coldwater fish, like sardines, salmon, and mackerel.  Limit how much you eat of the following: ? Canned or prepackaged foods. ? Food that is high in trans fat, such as fried foods. ? Food that is high in saturated fat, such as fatty meat. ? Sweets, desserts, sugary drinks, and other foods with added sugar. ? Full-fat dairy products.  Do not salt foods before eating.  Try to eat at least 2 vegetarian meals each week.  Eat more home-cooked food and less restaurant, buffet, and fast food.  When eating at a restaurant, ask that your food be prepared with less salt or no salt, if possible. What foods are recommended? The items listed may not be  a complete list. Talk with your dietitian about what dietary choices are best for you. Grains Whole-grain or whole-wheat bread. Whole-grain or whole-wheat pasta. Brown rice. [place]row. Bulgur. Whole-grain and low-sodium cereals. Pita bread. Low-fat, low-sodium crackers. Whole-wheat flour tortillas. Vegetables Fresh or frozen vegetables (raw, steamed, roasted, or grilled). Low-sodium or reduced-sodium tomato and vegetable juice. Low-sodium or reduced-sodium tomato sauce and tomato paste. Low-sodium or reduced-sodium canned vegetables. Fruits All fresh, dried, or frozen  fruit. Canned fruit in natural juice (without added sugar). Meat and other protein foods Skinless chicken or [place]. Ground chicken or [place]. Pork with fat trimmed off. Fish and seafood. Egg whites. Dried beans, peas, or lentils. Unsalted nuts, nut butters, and seeds. Unsalted canned beans. Lean cuts of beef with fat trimmed off. Low-sodium, lean deli meat. Dairy Low-fat (1%) or fat-free (skim) milk. Fat-free, low-fat, or reduced-fat cheeses. Nonfat, low-sodium ricotta or cottage cheese. Low-fat or nonfat yogurt. Low-fat, low-sodium cheese. Fats and oils Soft margarine without trans fats. Vegetable oil. Low-fat, reduced-fat, or light mayonnaise and salad dressings (reduced-sodium). Canola, safflower, olive, soybean, and sunflower oils. Avocado. Seasoning and other foods Herbs. Spices. Seasoning mixes without salt. Unsalted popcorn and pretzels. Fat-free sweets. What foods are not recommended? The items listed may not be a complete list. Talk with your dietitian about what dietary choices are best for you. Grains Baked goods made with fat, such as croissants, muffins, or some breads. Dry pasta or rice meal packs. Vegetables Creamed or fried vegetables. Vegetables in a cheese sauce. Regular canned vegetables (not low-sodium or reduced-sodium). Regular canned tomato sauce and paste (not low-sodium or reduced-sodium). Regular tomato and vegetable juice (not low-sodium or reduced-sodium). Angie Fava. Olives. Fruits Canned fruit in a light or heavy syrup. Fried fruit. Fruit in cream or butter sauce. Meat and other protein foods Fatty cuts of meat. Ribs. Fried meat. Berniece Salines. Sausage. Bologna and other processed lunch meats. Salami. Fatback. Hotdogs. Bratwurst. Salted nuts and seeds. Canned beans with added salt. Canned or smoked fish. Whole eggs or egg yolks. Chicken or [place] with skin. Dairy Whole or 2% milk, cream, and half-and-half. Whole or full-fat cream cheese. Whole-fat or sweetened yogurt. Full-fat  cheese. Nondairy creamers. Whipped toppings. Processed cheese and cheese spreads. Fats and oils Butter. Stick margarine. Lard. Shortening. Ghee. Bacon fat. Tropical oils, such as coconut, palm kernel, or palm oil. Seasoning and other foods Salted popcorn and pretzels. Onion salt, garlic salt, seasoned salt, table salt, and sea salt. Worcestershire sauce. Tartar sauce. Barbecue sauce. Teriyaki sauce. Soy sauce, including reduced-sodium. Steak sauce. Canned and packaged gravies. Fish sauce. Oyster sauce. Cocktail sauce. Horseradish that you find on the shelf. Ketchup. Mustard. Meat flavorings and tenderizers. Bouillon cubes. Hot sauce and Tabasco sauce. Premade or packaged marinades. Premade or packaged taco seasonings. Relishes. Regular salad dressings. Where to find more information:  National Heart, Lung, and [place]: https://wilson-eaton.com/  American Heart Association: www.heart.org Summary  The DASH eating plan is a healthy eating plan that has been shown to reduce high blood pressure (hypertension). It may also reduce your risk for type 2 diabetes, heart disease, and stroke.  With the DASH eating plan, you should limit salt (sodium) intake to 2,300 mg a day. If you have hypertension, you may need to reduce your sodium intake to 1,500 mg a day.  When on the DASH eating plan, aim to eat more fresh fruits and vegetables, whole grains, lean proteins, low-fat dairy, and heart-healthy fats.  Work with your health care provider or diet and nutrition specialist (  dietitian) to adjust your eating plan to your individual calorie needs. This information is not intended to replace advice given to you by your health care provider. Make sure you discuss any questions you have with your health care provider. Document Revised: 08/20/2017 Document Reviewed: 08/31/2016 Elsevier Patient Education  2020 [employer].

## 2020-03-26 NOTE — Assessment & Plan Note (Signed)
PDMP reviewed.

## 2020-03-26 NOTE — Assessment & Plan Note (Signed)
BP Readings from Last 3 Encounters:  03/26/20 (!) 164/70  04/27/19 124/70  03/23/19 126/78   Repeat 170/90 on right (emotionally upset) Will increase the amlodipine Recheck 2 months

## 2020-03-27 LAB — PARATHYROID HORMONE, INTACT (NO CA): PTH: 78 pg/mL — ABNORMAL HIGH (ref 14–64)

## 2020-04-11 ENCOUNTER — Other Ambulatory Visit: Payer: Self-pay | Admitting: Internal Medicine

## 2020-04-11 NOTE — Telephone Encounter (Signed)
Last filled6-24-21#90 Last OV 03-26-20 Next OV 05-28-20 [facility]  Forwarding to [facility] in Dr Alla German absence

## 2020-05-10 ENCOUNTER — Other Ambulatory Visit: Payer: Self-pay | Admitting: Internal Medicine

## 2020-05-10 NOTE — Telephone Encounter (Signed)
Due tomorrow  Last filled7-22-21#90 Last OV 03-26-20 Next OV 05-28-20 [facility]

## 2020-05-28 ENCOUNTER — Encounter: Payer: Self-pay | Admitting: Internal Medicine

## 2020-05-28 ENCOUNTER — Other Ambulatory Visit: Payer: Self-pay

## 2020-05-28 ENCOUNTER — Ambulatory Visit (INDEPENDENT_AMBULATORY_CARE_PROVIDER_SITE_OTHER): Payer: Medicare Other | Admitting: Internal Medicine

## 2020-05-28 VITALS — BP 170/98 | HR 91 | Temp 97.5°F | Wt 204.0 lb

## 2020-05-28 DIAGNOSIS — I10 Essential (primary) hypertension: Secondary | ICD-10-CM

## 2020-05-28 DIAGNOSIS — F39 Unspecified mood [affective] disorder: Secondary | ICD-10-CM | POA: Diagnosis not present

## 2020-05-28 DIAGNOSIS — Z23 Encounter for immunization: Secondary | ICD-10-CM | POA: Diagnosis not present

## 2020-05-28 MED ORDER — METOPROLOL SUCCINATE ER 25 MG PO TB24: 25.0000 mg | ORAL_TABLET | Freq: Every day | ORAL | 3 refills | Status: DC

## 2020-05-28 NOTE — Assessment & Plan Note (Signed)
Having daily anxiety Somewhat related to being out of work--discussed Will hold off on Rx for now

## 2020-05-28 NOTE — Progress Notes (Signed)
Subjective:    Patient ID: Sheri [name], female    DOB: 1951/02/24, 69 y.o.   MRN: [id number]  HPI Here for follow up of elevated blood pressure This visit occurred during the SARS-CoV-2 public health emergency.  Safety protocols were in place, including screening questions prior to the visit, additional usage of staff PPE, and extensive cleaning of exam room while observing appropriate contact time as indicated for disinfecting solutions.   Feeling anxious every day Still looking for a job Stomach feels nervous--"but I don't know why" Some depressed mood  Has been checking BP----155/95 (wrist cuff) No problems with the increase in amlodipine No chest pain---other than indigestion that tums helps No SOB  Current Outpatient Medications on File Prior to Visit  Medication Sig Dispense Refill   amLODipine (NORVASC) 5 MG tablet Take 1 tablet (5 mg total) by mouth daily. 90 tablet 3   aspirin 81 MG tablet Take 81 mg by mouth daily.     atorvastatin (LIPITOR) 20 MG tablet TAKE 1 TABLET(20 MG) BY MOUTH DAILY 90 tablet 3   BIOTIN PO Take by mouth.     [facility] PAIN RELIEF 500 MG tablet TAKE 1 TABLET EVERY 4 TO 6 HOURS AS NEEDED  0   Cyanocobalamin (B-12 PO) Take by mouth.     cyclobenzaprine (FLEXERIL) 10 MG tablet TAKE 1/2 TO 1 TABLET(5 TO 10 MG) BY MOUTH AT BEDTIME AS NEEDED FOR MUSCLE SPASMS 30 tablet 0   traMADol (ULTRAM) 50 MG tablet TAKE 1 TABLET(50 MG) BY MOUTH THREE TIMES DAILY AS NEEDED 90 tablet 0   valsartan-hydrochlorothiazide (DIOVAN-HCT) 320-25 MG tablet TAKE 1 TABLET BY MOUTH DAILY 90 tablet 3   Vitamin D, Cholecalciferol, 10 MCG (400 UNIT) TABS Take by mouth.     No current facility-administered medications on file prior to visit.    Allergies  Allergen Reactions   Codeine Nausea And Vomiting    Past Medical History:  Diagnosis Date   Allergy    GERD (gastroesophageal reflux disease)    Hypertension    IBS (irritable bowel syndrome)    Nocturia     Personal history of colonic adenomas 10/09/2013   Wrist fracture, left 1/14    Past Surgical History:  Procedure Laterality Date   ABDOMINAL HYSTERECTOMY     CARPAL TUNNEL RELEASE  1982   rt   CARPAL TUNNEL RELEASE Right 09/12/2013   Procedure: RIGHT CARPAL TUNNEL RELEASE;  Surgeon: Cammie Sickle., MD;  Location: [facility];  Service: Orthopedics;  Laterality: Right;   COLONOSCOPY  2015, 2018   DORSAL COMPARTMENT RELEASE Left 03/16/2013   Procedure: LEFT FIRST RELEASE DORSAL COMPARTMENT (DEQUERVAIN) EXCISION OF CYST  A1 PULLEY LEFT THUMB;  Surgeon: Cammie Sickle., MD;  Location: [facility];  Service: Orthopedics;  Laterality: Left;   ESOPHAGOGASTRODUODENOSCOPY (EGD) WITH ESOPHAGEAL DILATION  03/2017   ring   TONSILLECTOMY     TUBAL LIGATION      Family History  Problem Relation Age of Onset   Dementia Mother        Lewy body   Hypertension Sister    Cancer Sister        breast   Colon cancer Neg Hx    Esophageal cancer Neg Hx    Rectal cancer Neg Hx    Stomach cancer Neg Hx    Heart disease Neg Hx     Social History   Socioeconomic History   Marital status: Divorced    Spouse  name: Not on file   Number of children: 2   Years of education: 72   Highest education level: Not on file  Occupational History   Occupation: Stock returns clerk    Comment: [employer]  Tobacco Use   Smoking status: Former Smoker    Types: Cigarettes    Quit date: 10/13/2002    Years since quitting: 17.6   Smokeless tobacco: Never Used  Vaping Use   Vaping Use: Never used  Substance and Sexual Activity   Alcohol use: No   Drug use: No   Sexual activity: Never  Other Topics Concern   Not on file  Social History Narrative   HSG. [place]- criminal justice. Married '73 - 18 yr/divorced. 1 dtr - '82, 1 son - '73; 1 grandchild.    Lives in her own place      No formal living will   Son, then daughter, should make health care POA    Would accept resuscitation   Not sure about tube feeds--but wouldn't want if cognitively unaware   Social Determinants of Health   Financial Resource Strain:    Difficulty of Paying Living Expenses: Not on file  Food Insecurity:    Worried About [place] in the Last Year: Not on file   Ran Out of Food in the Last Year: Not on file  Transportation Needs:    Lack of Transportation (Medical): Not on file   Lack of Transportation (Non-Medical): Not on file  Physical Activity:    Days of Exercise per Week: Not on file   Minutes of Exercise per Session: Not on file  Stress:    Feeling of Stress : Not on file  Social Connections:    Frequency of Communication with Friends and Family: Not on file   Frequency of Social Gatherings with Friends and Family: Not on file   Attends Religious Services: Not on file   Active Member of Clubs or Organizations: Not on file   Attends Archivist Meetings: Not on file   Marital Status: Not on file  Intimate Partner Violence:    Fear of Current or Ex-Partner: Not on file   Emotionally Abused: Not on file   Physically Abused: Not on file   Sexually Abused: Not on file   Review of Systems Appetite is okay Weight about the same Hasn't been sleeping well for decades--wakes up in middle of the night and can't get back to sleep ("can;t turn my mind off")    Objective:   Physical Exam Constitutional:      Appearance: Normal appearance.  Cardiovascular:     Rate and Rhythm: Normal rate and regular rhythm.     Pulses: Normal pulses.     Heart sounds: No murmur heard.  No gallop.   Pulmonary:     Effort: Pulmonary effort is normal.     Breath sounds: Normal breath sounds. No wheezing or rales.  Musculoskeletal:     Cervical back: Neck supple.     Right lower leg: No edema.     Left lower leg: No edema.  Lymphadenopathy:     Cervical: No cervical adenopathy.  Neurological:     Mental Status: She is alert.    Psychiatric:     Comments: Some anxiety--but not depressed            Assessment & Plan:

## 2020-05-28 NOTE — Patient Instructions (Signed)
DASH Eating Plan DASH stands for "Dietary Approaches to Stop Hypertension." The DASH eating plan is a healthy eating plan that has been shown to reduce high blood pressure (hypertension). It may also reduce your risk for type 2 diabetes, heart disease, and stroke. The DASH eating plan may also help with weight loss. What are tips for following this plan?  General guidelines  Avoid eating more than 2,300 mg (milligrams) of salt (sodium) a day. If you have hypertension, you may need to reduce your sodium intake to 1,500 mg a day.  Limit alcohol intake to no more than 1 drink a day for nonpregnant women and 2 drinks a day for men. One drink equals 12 oz of beer, 5 oz of wine, or 1 oz of hard liquor.  Work with your health care provider to maintain a healthy body weight or to lose weight. Ask what an ideal weight is for you.  Get at least 30 minutes of exercise that causes your heart to beat faster (aerobic exercise) most days of the week. Activities may include walking, swimming, or biking.  Work with your health care provider or diet and nutrition specialist (dietitian) to adjust your eating plan to your individual calorie needs. Reading food labels   Check food labels for the amount of sodium per serving. Choose foods with less than 5 percent of the Daily Value of sodium. Generally, foods with less than 300 mg of sodium per serving fit into this eating plan.  To find whole grains, look for the word "whole" as the first word in the ingredient list. Shopping  Buy products labeled as "low-sodium" or "no salt added."  Buy fresh foods. Avoid canned foods and premade or frozen meals. Cooking  Avoid adding salt when cooking. Use salt-free seasonings or herbs instead of table salt or sea salt. Check with your health care provider or pharmacist before using salt substitutes.  Do not fry foods. Cook foods using healthy methods such as baking, boiling, grilling, and broiling instead.  Cook with  heart-healthy oils, such as olive, canola, soybean, or sunflower oil. Meal planning  Eat a balanced diet that includes: ? 5 or more servings of fruits and vegetables each day. At each meal, try to fill half of your plate with fruits and vegetables. ? Up to 6-8 servings of whole grains each day. ? Less than 6 oz of lean meat, poultry, or fish each day. A 3-oz serving of meat is about the same size as a deck of cards. One egg equals 1 oz. ? 2 servings of low-fat dairy each day. ? A serving of nuts, seeds, or beans 5 times each week. ? Heart-healthy fats. Healthy fats called Omega-3 fatty acids are found in foods such as flaxseeds and coldwater fish, like sardines, salmon, and mackerel.  Limit how much you eat of the following: ? Canned or prepackaged foods. ? Food that is high in trans fat, such as fried foods. ? Food that is high in saturated fat, such as fatty meat. ? Sweets, desserts, sugary drinks, and other foods with added sugar. ? Full-fat dairy products.  Do not salt foods before eating.  Try to eat at least 2 vegetarian meals each week.  Eat more home-cooked food and less restaurant, buffet, and fast food.  When eating at a restaurant, ask that your food be prepared with less salt or no salt, if possible. What foods are recommended? The items listed may not be a complete list. Talk with your dietitian about   what dietary choices are best for you. Grains Whole-grain or whole-wheat bread. Whole-grain or whole-wheat pasta. Brown rice. Oatmeal. Quinoa. Bulgur. Whole-grain and low-sodium cereals. Pita bread. Low-fat, low-sodium crackers. Whole-wheat flour tortillas. Vegetables Fresh or frozen vegetables (raw, steamed, roasted, or grilled). Low-sodium or reduced-sodium tomato and vegetable juice. Low-sodium or reduced-sodium tomato sauce and tomato paste. Low-sodium or reduced-sodium canned vegetables. Fruits All fresh, dried, or frozen fruit. Canned fruit in natural juice (without  added sugar). Meat and other protein foods Skinless chicken or turkey. Ground chicken or turkey. Pork with fat trimmed off. Fish and seafood. Egg whites. Dried beans, peas, or lentils. Unsalted nuts, nut butters, and seeds. Unsalted canned beans. Lean cuts of beef with fat trimmed off. Low-sodium, lean deli meat. Dairy Low-fat (1%) or fat-free (skim) milk. Fat-free, low-fat, or reduced-fat cheeses. Nonfat, low-sodium ricotta or cottage cheese. Low-fat or nonfat yogurt. Low-fat, low-sodium cheese. Fats and oils Soft margarine without trans fats. Vegetable oil. Low-fat, reduced-fat, or light mayonnaise and salad dressings (reduced-sodium). Canola, safflower, olive, soybean, and sunflower oils. Avocado. Seasoning and other foods Herbs. Spices. Seasoning mixes without salt. Unsalted popcorn and pretzels. Fat-free sweets. What foods are not recommended? The items listed may not be a complete list. Talk with your dietitian about what dietary choices are best for you. Grains Baked goods made with fat, such as croissants, muffins, or some breads. Dry pasta or rice meal packs. Vegetables Creamed or fried vegetables. Vegetables in a cheese sauce. Regular canned vegetables (not low-sodium or reduced-sodium). Regular canned tomato sauce and paste (not low-sodium or reduced-sodium). Regular tomato and vegetable juice (not low-sodium or reduced-sodium). Pickles. Olives. Fruits Canned fruit in a light or heavy syrup. Fried fruit. Fruit in cream or butter sauce. Meat and other protein foods Fatty cuts of meat. Ribs. Fried meat. Bacon. Sausage. Bologna and other processed lunch meats. Salami. Fatback. Hotdogs. Bratwurst. Salted nuts and seeds. Canned beans with added salt. Canned or smoked fish. Whole eggs or egg yolks. Chicken or turkey with skin. Dairy Whole or 2% milk, cream, and half-and-half. Whole or full-fat cream cheese. Whole-fat or sweetened yogurt. Full-fat cheese. Nondairy creamers. Whipped toppings.  Processed cheese and cheese spreads. Fats and oils Butter. Stick margarine. Lard. Shortening. Ghee. Bacon fat. Tropical oils, such as coconut, palm kernel, or palm oil. Seasoning and other foods Salted popcorn and pretzels. Onion salt, garlic salt, seasoned salt, table salt, and sea salt. Worcestershire sauce. Tartar sauce. Barbecue sauce. Teriyaki sauce. Soy sauce, including reduced-sodium. Steak sauce. Canned and packaged gravies. Fish sauce. Oyster sauce. Cocktail sauce. Horseradish that you find on the shelf. Ketchup. Mustard. Meat flavorings and tenderizers. Bouillon cubes. Hot sauce and Tabasco sauce. Premade or packaged marinades. Premade or packaged taco seasonings. Relishes. Regular salad dressings. Where to find more information:  National Heart, Lung, and Blood Institute: www.nhlbi.nih.gov  American Heart Association: www.heart.org Summary  The DASH eating plan is a healthy eating plan that has been shown to reduce high blood pressure (hypertension). It may also reduce your risk for type 2 diabetes, heart disease, and stroke.  With the DASH eating plan, you should limit salt (sodium) intake to 2,300 mg a day. If you have hypertension, you may need to reduce your sodium intake to 1,500 mg a day.  When on the DASH eating plan, aim to eat more fresh fruits and vegetables, whole grains, lean proteins, low-fat dairy, and heart-healthy fats.  Work with your health care provider or diet and nutrition specialist (dietitian) to adjust your eating plan to your   individual calorie needs. This information is not intended to replace advice given to you by your health care provider. Make sure you discuss any questions you have with your health care provider. Document Revised: 08/20/2017 Document Reviewed: 08/31/2016 Elsevier Patient Education  2020 Elsevier Inc.  

## 2020-05-28 NOTE — Assessment & Plan Note (Signed)
BP Readings from Last 3 Encounters:  05/28/20 (!) 170/98  03/26/20 (!) 164/70  04/27/19 124/70   Still high at work and on my recheck Will add metoprolol 25 (succinate)

## 2020-06-07 ENCOUNTER — Other Ambulatory Visit: Payer: Self-pay | Admitting: Internal Medicine

## 2020-06-07 NOTE — Telephone Encounter (Signed)
Last filled 05-10-20 #90 Last OV 05-28-20 Next OV 07-26-20 [place]

## 2020-07-05 ENCOUNTER — Other Ambulatory Visit: Payer: Self-pay | Admitting: Internal Medicine

## 2020-07-06 NOTE — Telephone Encounter (Signed)
Last filled 06-07-20 #90 Last OV 05-28-20 Next OV 07-26-20 [place]

## 2020-07-12 DIAGNOSIS — Z1231 Encounter for screening mammogram for malignant neoplasm of breast: Secondary | ICD-10-CM | POA: Diagnosis not present

## 2020-07-16 ENCOUNTER — Other Ambulatory Visit: Payer: Self-pay | Admitting: Family Medicine

## 2020-07-16 ENCOUNTER — Encounter: Payer: Self-pay | Admitting: Family Medicine

## 2020-07-16 ENCOUNTER — Other Ambulatory Visit: Payer: Self-pay

## 2020-07-16 ENCOUNTER — Other Ambulatory Visit: Payer: Medicare Other

## 2020-07-16 ENCOUNTER — Telehealth (INDEPENDENT_AMBULATORY_CARE_PROVIDER_SITE_OTHER): Payer: Medicare Other | Admitting: Family Medicine

## 2020-07-16 VITALS — Temp 99.7°F

## 2020-07-16 DIAGNOSIS — Z20822 Contact with and (suspected) exposure to covid-19: Secondary | ICD-10-CM

## 2020-07-16 DIAGNOSIS — J069 Acute upper respiratory infection, unspecified: Secondary | ICD-10-CM | POA: Diagnosis not present

## 2020-07-16 MED ORDER — PROMETHAZINE-DM 6.25-15 MG/5ML PO SYRP: 2.5000 mL | ORAL_SOLUTION | Freq: Four times a day (QID) | ORAL | 0 refills | Status: DC | PRN

## 2020-07-16 NOTE — Progress Notes (Signed)
Seen for virtual today.   Needs to schedule afternoon covid testing

## 2020-07-16 NOTE — Patient Instructions (Addendum)
Return for Covid testing today Isolate until results are back   Based on your symptoms, it looks like you have a virus.   Antibiotics are not need for a viral infection but the following will help:   1. Drink plenty of fluids 2. Get lots of rest  Sinus Congestion 1) Neti Pot (Saline rinse) -- 2 times day -- if tolerated 2) Flonase (Store Brand ok) - once daily 3) Over the counter congestion medications  Cough 1) Cough drops can be helpful 2) Nyquil (or nighttime cough medication) 3) Honey is proven to be one of the best cough medications  4) Cough medicine with Dextromethorphan can also be helpful  Sore Throat 1) Honey as above, cough drops 2) Ibuprofen or Aleve can be helpful 3) Salt water Gargles  If you develop fevers (Temperature >100.4), chills, worsening symptoms or symptoms lasting longer than 10 days return to clinic.

## 2020-07-16 NOTE — Progress Notes (Signed)
I connected with Christell Constant on 07/16/20 at  8:20 AM EDT by video and verified that I am speaking with the correct person using two identifiers.   I discussed the limitations, risks, security and privacy concerns of performing an evaluation and management service by video and the availability of in person appointments. I also discussed with the patient that there may be a patient responsible charge related to this service. The patient expressed understanding and agreed to proceed.  Patient location: Home Provider Location: [place] Participants: Lesleigh Noe and Christell Constant   Subjective:     Sheri [name] is a 69 y.o. female presenting for Cough (x 2 days ), Sinusitis (x 2 days ), Headache (x 2 days ), and Sore Throat ("scratchy" since sunday morning )     Cough Associated symptoms include headaches and a sore throat. Pertinent negatives include no ear pain, myalgias or shortness of breath.  Sinusitis This is a new problem. The current episode started in the past 7 days. The problem has been gradually worsening since onset. Maximum temperature: 99.7. Associated symptoms include congestion, coughing (keeping her up), headaches, sinus pressure and a sore throat. Pertinent negatives include no ear pain, shortness of breath or swollen glands. Past treatments include nothing.  Headache  Associated symptoms include coughing (keeping her up), sinus pressure and a sore throat. Pertinent negatives include no abdominal pain, ear pain, swollen glands or vomiting.  Sore Throat  Associated symptoms include congestion, coughing (keeping her up) and headaches. Pertinent negatives include no abdominal pain, diarrhea, ear pain, shortness of breath, swollen glands or vomiting.   No loss of taste or smell No known sick contact - lives alone - several coworkers have had    Review of Systems  HENT: Positive for congestion, sinus pressure and sore throat. Negative for ear pain.     Respiratory: Positive for cough (keeping her up). Negative for shortness of breath.   Gastrointestinal: Negative for abdominal pain, diarrhea and vomiting.  Musculoskeletal: Negative for arthralgias and myalgias.  Neurological: Positive for headaches.     Social History   Tobacco Use  Smoking Status Former Smoker  . Types: Cigarettes  . Quit date: 10/13/2002  . Years since quitting: 17.7  Smokeless Tobacco Never Used        Objective:   BP Readings from Last 3 Encounters:  05/28/20 (!) 170/98  03/26/20 (!) 164/70  04/27/19 124/70   Wt Readings from Last 3 Encounters:  05/28/20 204 lb (92.5 kg)  03/26/20 205 lb (93 kg)  04/27/19 191 lb (86.6 kg)    Temp 99.7 F (37.6 C) (Oral)    Physical Exam Constitutional:      Appearance: Normal appearance. She is not ill-appearing.  HENT:     Head: Normocephalic and atraumatic.     Right Ear: External ear normal.     Left Ear: External ear normal.  Eyes:     Conjunctiva/sclera: Conjunctivae normal.  Pulmonary:     Effort: Pulmonary effort is normal. No respiratory distress.  Neurological:     Mental Status: She is alert. Mental status is at baseline.  Psychiatric:        Mood and Affect: Mood normal.        Behavior: Behavior normal.        Thought Content: Thought content normal.        Judgment: Judgment normal.           Assessment & Plan:   Problem List  Items Addressed This Visit    None    Visit Diagnoses    Viral URI with cough    -  Primary   Relevant Medications   promethazine-dextromethorphan (PROMETHAZINE-DM) 6.25-15 MG/5ML syrup   Suspected COVID-19 virus infection         Discussed OTC treatment for viral illness Patient will come today for drive-up testing Instructed to isolate until the results come back  If negative and symptoms persisting will call back for consideration for course of antibiotics  Return if symptoms worsen or fail to improve.  Lesleigh Noe, MD

## 2020-07-18 LAB — NOVEL CORONAVIRUS, NAA: SARS-CoV-2, NAA: NOT DETECTED

## 2020-07-18 LAB — SARS-COV-2, NAA 2 DAY TAT

## 2020-07-26 ENCOUNTER — Ambulatory Visit (INDEPENDENT_AMBULATORY_CARE_PROVIDER_SITE_OTHER): Payer: Medicare Other | Admitting: Internal Medicine

## 2020-07-26 ENCOUNTER — Other Ambulatory Visit: Payer: Self-pay

## 2020-07-26 ENCOUNTER — Encounter: Payer: Self-pay | Admitting: Internal Medicine

## 2020-07-26 DIAGNOSIS — I10 Essential (primary) hypertension: Secondary | ICD-10-CM | POA: Diagnosis not present

## 2020-07-26 MED ORDER — CYCLOBENZAPRINE HCL 10 MG PO TABS
ORAL_TABLET | ORAL | 0 refills | Status: DC
Start: 2020-07-26 — End: 2020-12-20

## 2020-07-26 NOTE — Progress Notes (Signed)
Subjective:    Patient ID: Sheri [name], female    DOB: 02-28-1951, 69 y.o.   MRN: [id number]  HPI Here for follow up of hypertension This visit occurred during the SARS-CoV-2 public health emergency.  Safety protocols were in place, including screening questions prior to the visit, additional usage of staff PPE, and extensive cleaning of exam room while observing appropriate contact time as indicated for disinfecting solutions.   Back at work since shortly after the last visit Not checking BP Some headaches--relates to sinus  Some SOB with walking--stable No chest pain Slight edema in right ankle---not bad Some dizziness---relates to the heat at work. No syncope  Current Outpatient Medications on File Prior to Visit  Medication Sig Dispense Refill  . amLODipine (NORVASC) 5 MG tablet Take 1 tablet (5 mg total) by mouth daily. 90 tablet 3  . aspirin 81 MG tablet Take 81 mg by mouth daily.    Marland Kitchen atorvastatin (LIPITOR) 20 MG tablet TAKE 1 TABLET(20 MG) BY MOUTH DAILY 90 tablet 3  . BIOTIN PO Take by mouth.    . [facility] PAIN RELIEF 500 MG tablet TAKE 1 TABLET EVERY 4 TO 6 HOURS AS NEEDED  0  . Cyanocobalamin (B-12 PO) Take by mouth.    . cyclobenzaprine (FLEXERIL) 10 MG tablet TAKE 1/2 TO 1 TABLET(5 TO 10 MG) BY MOUTH AT BEDTIME AS NEEDED FOR MUSCLE SPASMS 30 tablet 0  . metoprolol succinate (TOPROL-XL) 25 MG 24 hr tablet Take 1 tablet (25 mg total) by mouth daily. 90 tablet 3  . traMADol (ULTRAM) 50 MG tablet TAKE 1 TABLET(50 MG) BY MOUTH THREE TIMES DAILY AS NEEDED 90 tablet 0  . valsartan-hydrochlorothiazide (DIOVAN-HCT) 320-25 MG tablet TAKE 1 TABLET BY MOUTH DAILY 90 tablet 3  . Vitamin D, Cholecalciferol, 10 MCG (400 UNIT) TABS Take by mouth.     No current facility-administered medications on file prior to visit.    Allergies  Allergen Reactions  . Codeine Nausea And Vomiting    Past Medical History:  Diagnosis Date  . Allergy   . GERD (gastroesophageal reflux disease)   .  Hypertension   . IBS (irritable bowel syndrome)   . Nocturia   . Personal history of colonic adenomas 10/09/2013  . Wrist fracture, left 1/14    Past Surgical History:  Procedure Laterality Date  . ABDOMINAL HYSTERECTOMY    . CARPAL TUNNEL RELEASE  1982   rt  . CARPAL TUNNEL RELEASE Right 09/12/2013   Procedure: RIGHT CARPAL TUNNEL RELEASE;  Surgeon: Cammie Sickle., MD;  Location: [facility];  Service: Orthopedics;  Laterality: Right;  . COLONOSCOPY  2015, 2018  . DORSAL COMPARTMENT RELEASE Left 03/16/2013   Procedure: LEFT FIRST RELEASE DORSAL COMPARTMENT (DEQUERVAIN) EXCISION OF CYST  A1 PULLEY LEFT THUMB;  Surgeon: Cammie Sickle., MD;  Location: [facility];  Service: Orthopedics;  Laterality: Left;  . ESOPHAGOGASTRODUODENOSCOPY (EGD) WITH ESOPHAGEAL DILATION  03/2017   ring  . TONSILLECTOMY    . TUBAL LIGATION      Family History  Problem Relation Age of Onset  . Dementia Mother        Lewy body  . Hypertension Sister   . Cancer Sister        breast  . Colon cancer Neg Hx   . Esophageal cancer Neg Hx   . Rectal cancer Neg Hx   . Stomach cancer Neg Hx   . Heart disease Neg Hx  Social History   Socioeconomic History  . Marital status: Divorced    Spouse name: Not on file  . Number of children: 2  . Years of education: 43  . Highest education level: Not on file  Occupational History  . Occupation: [employer]    Comment: Medi  Tobacco Use  . Smoking status: Former Smoker    Types: Cigarettes    Quit date: 10/13/2002    Years since quitting: 17.7  . Smokeless tobacco: Never Used  Vaping Use  . Vaping Use: Never used  Substance and Sexual Activity  . Alcohol use: No  . Drug use: No  . Sexual activity: Never  Other Topics Concern  . 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 Married '73 - 18 yr/divorced. 1 dtr - '82, 1 son - '73; 1 grandchild.    Lives in her own place      No formal living  will   Son, then daughter, should make health care POA   Would accept resuscitation   Not sure about tube feeds--but wouldn't want if cognitively unaware   Social Determinants of Health   Financial Resource Strain:   . Difficulty of Paying Living Expenses: Not on file  Food Insecurity:   . Worried About Charity fundraiser in the Last Year: Not on file  . Ran Out of Food in the Last Year: Not on file  Transportation Needs:   . Lack of Transportation (Medical): Not on file  . Lack of Transportation (Non-Medical): Not on file  Physical Activity:   . Days of Exercise per Week: Not on file  . Minutes of Exercise per Session: Not on file  Stress:   . Feeling of Stress : Not on file  Social Connections:   . Frequency of Communication with Friends and Family: Not on file  . Frequency of Social Gatherings with Friends and Family: Not on file  . Attends Religious Services: Not on file  . Active Member of Clubs or Organizations: Not on file  . Attends Archivist Meetings: Not on file  . Marital Status: Not on file  Intimate Partner Violence:   . Fear of Current or Ex-Partner: Not on file  . Emotionally Abused: Not on file  . Physically Abused: Not on file  . Sexually Abused: Not on file    Review of Systems Stays hot all the time Did have coughing illness---COVID negative Better now with syrup Watching her eating---has lost some weight     Objective:   Physical Exam Constitutional:      Appearance: Normal appearance.  Cardiovascular:     Rate and Rhythm: Normal rate and regular rhythm.     Heart sounds: No murmur heard.  No gallop.   Pulmonary:     Effort: Pulmonary effort is normal.     Breath sounds: Normal breath sounds. No wheezing or rales.  Musculoskeletal:     Cervical back: Neck supple.     Right lower leg: No edema.     Left lower leg: No edema.  Lymphadenopathy:     Cervical: No cervical adenopathy.  Neurological:     Mental Status: She is alert.              Assessment & Plan:

## 2020-07-26 NOTE — Assessment & Plan Note (Signed)
BP Readings from Last 3 Encounters:  07/26/20 (!) 162/96  05/28/20 (!) 170/98  03/26/20 (!) 164/70   Remains high despite 4 meds Normal potassium but have to consider hyperaldo state Has CKD also--may be related to this Will set up with nephrology

## 2020-08-05 ENCOUNTER — Other Ambulatory Visit: Payer: Self-pay | Admitting: Internal Medicine

## 2020-08-05 NOTE — Telephone Encounter (Signed)
Last filled 07-07-20 #90 Last OV 07-26-20 Next OV 04-25-21 [place]

## 2020-08-21 DIAGNOSIS — Z23 Encounter for immunization: Secondary | ICD-10-CM | POA: Diagnosis not present

## 2020-09-03 ENCOUNTER — Other Ambulatory Visit: Payer: Self-pay | Admitting: Internal Medicine

## 2020-09-03 NOTE — Telephone Encounter (Signed)
Last filled10-17-21 #90 Last OV 07-26-20 Next OV 04-25-21 [place]

## 2020-10-02 ENCOUNTER — Other Ambulatory Visit: Payer: Self-pay | Admitting: Internal Medicine

## 2020-10-02 NOTE — Telephone Encounter (Signed)
Last filled12-14-21 #90 Last OV11-5-21 Next OV8-5-22 [place]

## 2020-10-03 DIAGNOSIS — R7309 Other abnormal glucose: Secondary | ICD-10-CM | POA: Diagnosis not present

## 2020-10-03 DIAGNOSIS — M797 Fibromyalgia: Secondary | ICD-10-CM | POA: Diagnosis not present

## 2020-10-03 DIAGNOSIS — N1831 Chronic kidney disease, stage 3a: Secondary | ICD-10-CM | POA: Diagnosis not present

## 2020-10-03 DIAGNOSIS — E1122 Type 2 diabetes mellitus with diabetic chronic kidney disease: Secondary | ICD-10-CM | POA: Diagnosis not present

## 2020-10-03 DIAGNOSIS — I129 Hypertensive chronic kidney disease with stage 1 through stage 4 chronic kidney disease, or unspecified chronic kidney disease: Secondary | ICD-10-CM | POA: Diagnosis not present

## 2020-10-03 DIAGNOSIS — E669 Obesity, unspecified: Secondary | ICD-10-CM | POA: Diagnosis not present

## 2020-10-03 DIAGNOSIS — D509 Iron deficiency anemia, unspecified: Secondary | ICD-10-CM | POA: Diagnosis not present

## 2020-10-08 ENCOUNTER — Other Ambulatory Visit: Payer: Self-pay | Admitting: Internal Medicine

## 2020-10-08 DIAGNOSIS — N1831 Chronic kidney disease, stage 3a: Secondary | ICD-10-CM

## 2020-10-16 ENCOUNTER — Telehealth: Payer: Self-pay | Admitting: Internal Medicine

## 2020-10-16 NOTE — Telephone Encounter (Signed)
Patient called in about the Metoprolol and atorvastatin states that both of these medications are thinning her hair really bad. Patient is wanting to know if we can try her on something else.EM

## 2020-10-16 NOTE — Telephone Encounter (Signed)
The metoprolol can cause hair loss (rarely) so she can try off this. Her blood pressure has been high--the next best medication to substitute requires 3 times a day dosing. Make sure she thinks she can remember to take it tid If so, send Rx for hydralazine 25mg  tid (#90 x 11) Set up follow up in 1 month or so to review

## 2020-10-16 NOTE — Telephone Encounter (Signed)
Patient would like a call back before 10:30 EM

## 2020-10-17 NOTE — Telephone Encounter (Signed)
Left message to call office

## 2020-10-21 NOTE — Telephone Encounter (Signed)
Patient returned your call. Please call back. EM

## 2020-10-21 NOTE — Telephone Encounter (Signed)
Left message on VM for pt to let me know what she wants to do.

## 2020-10-28 NOTE — Telephone Encounter (Signed)
Left another detailed message asking pt to let me know what she wants to do.

## 2020-11-01 ENCOUNTER — Other Ambulatory Visit: Payer: Self-pay | Admitting: Internal Medicine

## 2020-11-01 NOTE — Telephone Encounter (Signed)
Last filled1-12-22 #90 Last OV11-5-21 Next OV8-5-22 [place]

## 2020-11-07 DIAGNOSIS — R809 Proteinuria, unspecified: Secondary | ICD-10-CM | POA: Diagnosis not present

## 2020-11-07 NOTE — Telephone Encounter (Signed)
Left another message for pt to call office.

## 2020-11-18 ENCOUNTER — Ambulatory Visit
Admission: RE | Admit: 2020-11-18 | Discharge: 2020-11-18 | Disposition: A | Discharge status: Home / Self Care | Payer: Medicare Other | Source: Ambulatory Visit | Attending: Internal Medicine | Admitting: Internal Medicine

## 2020-11-18 DIAGNOSIS — N189 Chronic kidney disease, unspecified: Secondary | ICD-10-CM | POA: Diagnosis not present

## 2020-11-18 DIAGNOSIS — N1831 Chronic kidney disease, stage 3a: Secondary | ICD-10-CM

## 2020-11-29 ENCOUNTER — Other Ambulatory Visit: Payer: Self-pay | Admitting: Internal Medicine

## 2020-11-29 NOTE — Telephone Encounter (Signed)
Refill request for Tramadol 50 mg tablets  LOV - 07/26/20 Next OV - 04/25/21 Last refilled - 11/01/20 #90/0

## 2020-12-20 ENCOUNTER — Other Ambulatory Visit: Payer: Self-pay | Admitting: Internal Medicine

## 2020-12-20 NOTE — Telephone Encounter (Signed)
Last filled 10-26-20 #30 Last OV 07-26-20 Next OV 04-25-21 [facility] [address]

## 2020-12-30 ENCOUNTER — Other Ambulatory Visit: Payer: Self-pay | Admitting: Internal Medicine

## 2020-12-30 NOTE — Telephone Encounter (Signed)
Last filled3-11-22 #90 Last OV11-5-21 Next OV8-5-22 [place]

## 2021-01-29 ENCOUNTER — Other Ambulatory Visit: Payer: Self-pay | Admitting: Internal Medicine

## 2021-01-29 NOTE — Telephone Encounter (Signed)
Last filled4-12-22#90 Last OV11-5-21 Next OV8-5-22 [place]

## 2021-02-13 DIAGNOSIS — H40013 Open angle with borderline findings, low risk, bilateral: Secondary | ICD-10-CM | POA: Diagnosis not present

## 2021-02-18 ENCOUNTER — Other Ambulatory Visit: Payer: Self-pay | Admitting: Internal Medicine

## 2021-02-18 NOTE — Telephone Encounter (Signed)
Last filled 01-19-21 #30 Last OV 07-26-20 Next OV 04-25-21 [facility] [address]

## 2021-02-28 ENCOUNTER — Other Ambulatory Visit: Payer: Self-pay | Admitting: Internal Medicine

## 2021-02-28 NOTE — Telephone Encounter (Signed)
Last filled 01-29-21 #90 Last OV 07-26-20 Next OV 04-25-21 [place]

## 2021-03-06 DIAGNOSIS — H40013 Open angle with borderline findings, low risk, bilateral: Secondary | ICD-10-CM | POA: Diagnosis not present

## 2021-03-31 ENCOUNTER — Other Ambulatory Visit: Payer: Self-pay | Admitting: Internal Medicine

## 2021-04-01 NOTE — Telephone Encounter (Signed)
Last filled 03-01-21 #90 Last OV 07-26-20 Next OV 04-25-21 [place]

## 2021-04-02 ENCOUNTER — Telehealth: Payer: Self-pay

## 2021-04-02 NOTE — Telephone Encounter (Signed)
Completed Form on CoverMyMeds. Waiting for response. Can be 24-72 business hours

## 2021-04-07 NOTE — Telephone Encounter (Signed)
This medication is not covered per pt's plan. I spoke to the pt and advised her with a Goodrx card, she can get it for $25 at [facility] and $12 at [facility]. She does not live near a [facility] so she will get it at [facility] as she has been, but use a Goodrx card.

## 2021-04-25 ENCOUNTER — Ambulatory Visit: Payer: Medicare Other | Admitting: Internal Medicine

## 2021-04-25 ENCOUNTER — Encounter: Payer: Self-pay | Admitting: Internal Medicine

## 2021-04-25 ENCOUNTER — Other Ambulatory Visit: Payer: Self-pay

## 2021-04-25 DIAGNOSIS — N183 Chronic kidney disease, stage 3 unspecified: Secondary | ICD-10-CM

## 2021-04-25 DIAGNOSIS — I10 Essential (primary) hypertension: Secondary | ICD-10-CM

## 2021-04-25 DIAGNOSIS — Z0000 Encounter for general adult medical examination without abnormal findings: Secondary | ICD-10-CM

## 2021-04-25 DIAGNOSIS — F39 Unspecified mood [affective] disorder: Secondary | ICD-10-CM

## 2021-04-25 DIAGNOSIS — K2100 Gastro-esophageal reflux disease with esophagitis, without bleeding: Secondary | ICD-10-CM | POA: Diagnosis not present

## 2021-04-25 MED ORDER — OLMESARTAN MEDOXOMIL 40 MG PO TABS: 40.0000 mg | ORAL_TABLET | Freq: Every day | ORAL | 3 refills | Status: DC

## 2021-04-25 MED ORDER — OMEPRAZOLE 20 MG PO CPDR: 20.0000 mg | DELAYED_RELEASE_CAPSULE | Freq: Every day | ORAL | 3 refills | Status: DC

## 2021-04-25 MED ORDER — CHLORTHALIDONE 25 MG PO TABS: 25.0000 mg | ORAL_TABLET | Freq: Every day | ORAL | 3 refills | Status: DC

## 2021-04-25 NOTE — Assessment & Plan Note (Signed)
Better now that she is back at work

## 2021-04-25 NOTE — Assessment & Plan Note (Signed)
Generally doing okay Colon due next year--will go to GI soon due to fecal incontinence though Yearly mammogram through work Discussed exercise Second COVID booster and flu vaccine Got shingrix

## 2021-04-25 NOTE — Assessment & Plan Note (Signed)
Having daily symptoms and some dysphagia Will start omeprazole

## 2021-04-25 NOTE — Assessment & Plan Note (Signed)
Now on ARB Will recheck labs at follow up

## 2021-04-25 NOTE — Assessment & Plan Note (Signed)
BP Readings from Last 3 Encounters:  04/25/21 (!) 172/86  07/26/20 (!) 162/96  05/28/20 (!) 170/98   Doing okay on olmesartan---will increase to '40mg'$  Add chlorthalidone Hold off on amlodipine and metoprolol--she was concerned about hair loss

## 2021-04-25 NOTE — Progress Notes (Signed)
Subjective:    Patient ID: Sheri [name], female    DOB: [birth year], 70 y.o.   MRN: [id number]  HPI Here for physical This visit occurred during the SARS-CoV-2 public health emergency.  Safety protocols were in place, including screening questions prior to the visit, additional usage of staff PPE, and extensive cleaning of exam room while observing appropriate contact time as indicated for disinfecting solutions.   Notes some burning "in bottom" Them may have fecal incontinence Last time 2-3 months ago Does have BM regularly in the morning and sometimes bid  Is back at work This has really helped her mood Not feeling depressed much now  Now on olmesartan from nephrologist BP runs high in the morning--but then down to Q000111Q systolic Stopped the amlodipine and metoprolol Hair loss not clearly better off these  Continues on tramadol 2-3 times a day Uses the flexeril sporadically  Current Outpatient Medications on File Prior to Visit  Medication Sig Dispense Refill   aspirin 81 MG tablet Take 81 mg by mouth daily.     BIOTIN PO Take by mouth.     [facility] PAIN RELIEF 500 MG tablet TAKE 1 TABLET EVERY 4 TO 6 HOURS AS NEEDED  0   Cyanocobalamin (B-12 PO) Take by mouth.     cyclobenzaprine (FLEXERIL) 10 MG tablet TAKE 1/2 TO 1 TABLET(5 TO 10 MG) BY MOUTH AT BEDTIME AS NEEDED FOR MUSCLE SPASMS 30 tablet 1   GARLIC PO Take by mouth.     OVER THE COUNTER MEDICATION Super Beets Heart Chews     Probiotic Product (PROBIOTIC PO) Take by mouth.     traMADol (ULTRAM) 50 MG tablet TAKE 1 TABLET(50 MG) BY MOUTH THREE TIMES DAILY AS NEEDED 90 tablet 0   Vitamin D, Cholecalciferol, 10 MCG (400 UNIT) TABS Take by mouth.     olmesartan (BENICAR) 20 MG tablet Take 20 mg by mouth daily.     No current facility-administered medications on file prior to visit.    Allergies  Allergen Reactions   Codeine Nausea And Vomiting   Statins Other (See Comments)    Caused thinning hair/hair to fall out     Past Medical History:  Diagnosis Date   Allergy    GERD (gastroesophageal reflux disease)    Hypertension    IBS (irritable bowel syndrome)    Nocturia    Personal history of colonic adenomas 10/09/2013   Wrist fracture, left 1/14    Past Surgical History:  Procedure Laterality Date   ABDOMINAL HYSTERECTOMY     CARPAL TUNNEL RELEASE  1982   rt   CARPAL TUNNEL RELEASE Right 09/12/2013   Procedure: RIGHT CARPAL TUNNEL RELEASE;  Surgeon: Cammie Sickle., MD;  Location: [place];  Service: Orthopedics;  Laterality: Right;   COLONOSCOPY  2015, 2018   DORSAL COMPARTMENT RELEASE Left 03/16/2013   Procedure: LEFT FIRST RELEASE DORSAL COMPARTMENT (DEQUERVAIN) EXCISION OF CYST  A1 PULLEY LEFT THUMB;  Surgeon: Cammie Sickle., MD;  Location: [place];  Service: Orthopedics;  Laterality: Left;   ESOPHAGOGASTRODUODENOSCOPY (EGD) WITH ESOPHAGEAL DILATION  03/2017   ring   TONSILLECTOMY     TUBAL LIGATION      Family History  Problem Relation Age of Onset   Dementia Mother        Lewy body   Hypertension Sister    Cancer Sister        breast   Colon cancer Neg Hx  Esophageal cancer Neg Hx    Rectal cancer Neg Hx    Stomach cancer Neg Hx    Heart disease Neg Hx     Social History   Socioeconomic History   Marital status: Divorced    Spouse name: Not on file   Number of children: 2   Years of education: 13   Highest education level: Not on file  Occupational History   Occupation: [employer]    Comment: Medi  Tobacco Use   Smoking status: Former    Types: Cigarettes    Quit date: 10/13/2002    Years since quitting: 18.5   Smokeless tobacco: Never  Vaping Use   Vaping Use: Never used  Substance and Sexual Activity   Alcohol use: No   Drug use: No   Sexual activity: Never  Other Topics Concern   Not on file  Social History Narrative   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 Married '73 - 18 yr/divorced. 1 dtr - '82, 1 son - '73; 1  grandchild.    Lives in her own place      No formal living will   Son, then daughter, should make health care POA   Would accept resuscitation   Not sure about tube feeds--but wouldn't want if cognitively unaware   Social Determinants of Health   Financial Resource Strain: Not on file  Food Insecurity: Not on file  Transportation Needs: Not on file  Physical Activity: Not on file  Stress: Not on file  Social Connections: Not on file  Intimate Partner Violence: Not on file   Review of Systems  Constitutional:  Negative for fatigue.       Walks but no set exercise Wears seat belt Has lost over 20#  HENT:  Negative for hearing loss and tinnitus.        Needs to go to dentist---periodontal problems and some tooth pain Some dysphagia  Eyes:  Negative for visual disturbance.       No diplopia or unilateral vision loss  Respiratory:  Negative for cough, chest tightness and shortness of breath.        Has noted occasional DOE with steps  Cardiovascular:  Negative for leg swelling.       Gets daily chest burning--tums helps Occ fast beating heart---just mild  Gastrointestinal:  Negative for abdominal pain and constipation.  Endocrine: Negative for polydipsia and polyuria.  Genitourinary:  Negative for dysuria and hematuria.  Musculoskeletal:  Positive for myalgias. Negative for arthralgias, back pain and joint swelling.       Some hand pain  Skin:  Negative for rash.  Allergic/Immunologic: Negative for environmental allergies and immunocompromised state.  Neurological:  Positive for dizziness and headaches. Negative for syncope and light-headedness.       Takes tylenol for sinus at times  Hematological:  Negative for adenopathy. Does not bruise/bleed easily.  Psychiatric/Behavioral:  Negative for dysphoric mood.        Poor sleep for 40 years--even after flexeril      Objective:   Physical Exam Constitutional:      Appearance: Normal appearance.  Musculoskeletal:      Cervical back: Neck supple.  Lymphadenopathy:     Cervical: No cervical adenopathy.  Neurological:     Mental Status: She is alert.           Assessment & Plan:

## 2021-04-29 ENCOUNTER — Telehealth: Payer: Self-pay

## 2021-04-29 NOTE — Telephone Encounter (Signed)
-----   Message from Gatha Mayer, MD sent at 04/29/2021 11:35 AM EDT ----- Denice Paradise,  We will make her an appointment to see me or an APP  Glendell Docker ----- Message ----- From: Venia Carbon, MD Sent: 04/25/2021   9:44 AM EDT To: Gatha Mayer, MD  Glendell Docker, She has had some fecal incontinence and would like to be evaluated prior to next year when her colon is due. Can you set this up please? Also with dysphagia (and I am starting PPI) Rich

## 2021-04-29 NOTE — Telephone Encounter (Signed)
Left message for patient to please call back. Want to let her know we have scheduled an office visit with Sheri Newer PA on 05/23/21 @ 10:00 am, per her PCP request.

## 2021-04-30 ENCOUNTER — Telehealth: Payer: Self-pay

## 2021-04-30 NOTE — Telephone Encounter (Signed)
Called and left message again for patient to please call back. Left message that we have scheduled an office visit on 05/23/21 at 10:00 am, per her PCP request.

## 2021-05-01 ENCOUNTER — Other Ambulatory Visit: Payer: Self-pay | Admitting: Internal Medicine

## 2021-05-01 NOTE — Telephone Encounter (Signed)
Last filled 04-01-21 #90 Last OV 04-25-21 Next OV 05-27-21 [facility] [address]

## 2021-05-19 ENCOUNTER — Other Ambulatory Visit: Payer: Self-pay | Admitting: Internal Medicine

## 2021-05-19 NOTE — Telephone Encounter (Signed)
Last filled 04-19-21 #30 Last OV 04-25-21 Next OV 05-27-21 [facility] [address] [facility] [place]

## 2021-05-23 ENCOUNTER — Encounter: Payer: Self-pay | Admitting: Physician Assistant

## 2021-05-23 ENCOUNTER — Ambulatory Visit (INDEPENDENT_AMBULATORY_CARE_PROVIDER_SITE_OTHER): Payer: BLUE CROSS/BLUE SHIELD | Admitting: Physician Assistant

## 2021-05-23 VITALS — BP 174/88 | HR 68 | Ht 66.54 in | Wt 171.5 lb

## 2021-05-23 DIAGNOSIS — R1319 Other dysphagia: Secondary | ICD-10-CM

## 2021-05-23 DIAGNOSIS — R1084 Generalized abdominal pain: Secondary | ICD-10-CM | POA: Diagnosis not present

## 2021-05-23 DIAGNOSIS — R194 Change in bowel habit: Secondary | ICD-10-CM | POA: Diagnosis not present

## 2021-05-23 DIAGNOSIS — K219 Gastro-esophageal reflux disease without esophagitis: Secondary | ICD-10-CM | POA: Diagnosis not present

## 2021-05-23 DIAGNOSIS — R152 Fecal urgency: Secondary | ICD-10-CM

## 2021-05-23 DIAGNOSIS — R159 Full incontinence of feces: Secondary | ICD-10-CM

## 2021-05-23 MED ORDER — CLENPIQ 10-3.5-12 MG-GM -GM/160ML PO SOLN: 1.0000 | Freq: Once | ORAL | 0 refills | Status: AC

## 2021-05-23 NOTE — Progress Notes (Signed)
Chief Complaint: Fecal incontinence, dysphagia, abdominal pain  HPI:    [name] is a 70 year old African-American female with a past medical history as listed below including IBS and reflux, known to Dr. Carlean [name], who was referred to me by Sheri Carbon, MD for a complaint of fecal incontinence, dysphagia and abdominal pain.    03/22/2017 colonoscopy with a 5 mm polyp in the ascending colon.  She had a prior history of polyps as well and recall was placed in 2023.    03/22/2017 EGD with esophageal mucosal changes suspicious for eosinophilic esophagitis, dilated, otherwise normal exam.  Biopsies returned normal.    Today, the patient presents to clinic and explains that over the past 2 years she has been experiencing fecal incontinence telling me that she will feel something burning from her rectum or feel a wetness and will go to the bathroom and have stool in her underwear which "I did not even feel come out".  Tells me this can be varying amounts and is sometimes been quite a lot.  Due to this she carries an extra pair of underwear in her car.  This is very embarrassing to her.  Also feels like she is excessively gassy but she does not feel it coming out.  Tells me she does not feel like she has "any control down there".  Sometimes she can go a month or 2 with no problem but then it will start back.  Typically she has soft formed stools once or twice a day in the morning, this has been unchanged.    Also describes a chronic right lower quadrant abdominal pain which is there "all the time", sometimes it is worse than others and she cannot tell what makes it better or worse.  Tells me she did have surgery for "adhesions" many years ago.    Also discusses that occasionally she will see some bright red blood mixed in with her stool.    Tells me she also has trouble swallowing again, apparently after time of last EGD this helped the symptoms but now slowly over the past few months she has had a harder  time swallowing things such as rice which seem to get hung in her throat.  Has also been started on Omeprazole 20 mg daily for some reflux symptoms by her PCP which is helping with that.    Denies fever, chills, weight loss or symptoms that awaken her from sleep.  Past Medical History:  Diagnosis Date   Allergy    GERD (gastroesophageal reflux disease)    Hypertension    IBS (irritable bowel syndrome)    Nocturia    Personal history of colonic adenomas 10/09/2013   Wrist fracture, left 1/14    Past Surgical History:  Procedure Laterality Date   ABDOMINAL HYSTERECTOMY     CARPAL TUNNEL RELEASE  1982   rt   CARPAL TUNNEL RELEASE Right 09/12/2013   Procedure: RIGHT CARPAL TUNNEL RELEASE;  Surgeon: Cammie Sickle., MD;  Location: [place];  Service: Orthopedics;  Laterality: Right;   COLONOSCOPY  2015, 2018   DORSAL COMPARTMENT RELEASE Left 03/16/2013   Procedure: LEFT FIRST RELEASE DORSAL COMPARTMENT (DEQUERVAIN) EXCISION OF CYST  A1 PULLEY LEFT THUMB;  Surgeon: Cammie Sickle., MD;  Location: [place];  Service: Orthopedics;  Laterality: Left;   ESOPHAGOGASTRODUODENOSCOPY (EGD) WITH ESOPHAGEAL DILATION  03/2017   ring   TONSILLECTOMY     TUBAL LIGATION  Current Outpatient Medications  Medication Sig Dispense Refill   aspirin 81 MG tablet Take 81 mg by mouth daily.     BIOTIN PO Take by mouth.     Bismuth Subsalicylate (PEPTO-BISMOL PO) Take 1 Dose by mouth as needed.     Calcium Carbonate Antacid (TUMS EXTRA STRENGTH 750 PO) Take 1-3 tablets by mouth as needed.     chlorthalidone (HYGROTON) 25 MG tablet Take 1 tablet (25 mg total) by mouth daily. 90 tablet 3   [facility]S PAIN RELIEF 500 MG tablet TAKE 1 TABLET EVERY 4 TO 6 HOURS AS NEEDED  0   Cyanocobalamin (B-12 PO) Take by mouth.     cyclobenzaprine (FLEXERIL) 10 MG tablet TAKE 1/2 TO 1 TABLET(5 TO 10 MG) BY MOUTH AT BEDTIME AS NEEDED FOR MUSCLE SPASMS 30 tablet 1   GARLIC PO Take by  mouth.     olmesartan (BENICAR) 40 MG tablet Take 1 tablet (40 mg total) by mouth daily. 90 tablet 3   omeprazole (PRILOSEC) 20 MG capsule Take 1 capsule (20 mg total) by mouth daily. 90 capsule 3   OVER THE COUNTER MEDICATION Super Beets Heart Chews     Probiotic Product (PROBIOTIC PO) Take by mouth.     traMADol (ULTRAM) 50 MG tablet TAKE 1 TABLET(50 MG) BY MOUTH THREE TIMES DAILY AS NEEDED 90 tablet 0   Vitamin D, Cholecalciferol, 10 MCG (400 UNIT) TABS Take by mouth.     No current facility-administered medications for this visit.    Allergies as of 05/23/2021 - Review Complete 05/23/2021  Allergen Reaction Noted   Codeine Nausea And Vomiting    Statins Other (See Comments) 04/25/2021    Family History  Problem Relation Age of Onset   Dementia Mother        Lewy body   Hypertension Sister    Cancer Sister        breast   Colon cancer Neg Hx    Esophageal cancer Neg Hx    Rectal cancer Neg Hx    Stomach cancer Neg Hx    Heart disease Neg Hx     Social History   Socioeconomic History   Marital status: Divorced    Spouse name: Not on file   Number of children: 2   Years of education: 13   Highest education level: Not on file  Occupational History   Occupation: [employer]    Comment: Medi  Tobacco Use   Smoking status: Former    Types: Cigarettes    Quit date: 10/13/2002    Years since quitting: 18.6   Smokeless tobacco: Never  Vaping Use   Vaping Use: Never used  Substance and Sexual Activity   Alcohol use: No   Drug use: No   Sexual activity: Never  Other Topics Concern   Not on file  Social History Narrative   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 Married '73 - 18 yr/divorced. 1 dtr - '82, 1 son - '73; 1 grandchild.    Lives in her own place      No formal living will   Son, then daughter, should make health care POA   Would accept resuscitation   Not sure about tube feeds--but wouldn't want if cognitively unaware   Social Determinants of Health   Financial  Resource Strain: Not on file  Food Insecurity: Not on file  Transportation Needs: Not on file  Physical Activity: Not on file  Stress: Not on file  Social Connections: Not on file  Intimate Partner Violence: Not on file    Review of Systems:    Constitutional: No weight loss, fever or chills Skin: No rash Cardiovascular: No chest pain Respiratory: No SOB Gastrointestinal: See HPI and otherwise negative Genitourinary: No dysuria o Neurological: No headache, dizziness or syncope Musculoskeletal: No new muscle or joint pain Hematologic: No bruising Psychiatric: No history of depression or anxiety   Physical Exam:  Vital signs: BP (!) 174/88 (BP Location: Left Arm, Patient Position: Sitting, Cuff Size: Normal)   Pulse 68   Ht 5' 6.54" (1.69 m) Comment: height measured without shoes  Wt 171 lb 8 oz (77.8 kg)   BMI 27.24 kg/m   Constitutional:   Pleasant AA female appears to be in NAD, Well developed, Well nourished, alert and cooperative Head:  Normocephalic and atraumatic. Eyes:   PEERL, EOMI. No icterus. Conjunctiva pink. Ears:  Normal auditory acuity. Neck:  Supple Throat: Oral cavity and pharynx without inflammation, swelling or lesion.  Respiratory: Respirations even and unlabored. Lungs clear to auscultation bilaterally.   No wheezes, crackles, or rhonchi.  Cardiovascular: Normal S1, S2. No MRG. Regular rate and rhythm. No peripheral edema, cyanosis or pallor.  Gastrointestinal:  Soft, nondistended, moderate generalized TTP no rebound or guarding. Normal bowel sounds. No appreciable masses or hepatomegaly. Rectal: Patient declined Msk:  Symmetrical without gross deformities. Without edema, no deformity or joint abnormality.  Neurologic:  Alert and  oriented x4;  grossly normal neurologically.  Skin:   Dry and intact without significant lesions or rashes. Psychiatric: Demonstrates good judgement and reason without abnormal affect or behaviors.  No recent labs or  imaging.  Assessment: 1.  Fecal incontinence: Over the past 2 years, very embarrassing; consider hemorrhoids versus poor sphincter tone versus other 2.  Rectal bleeding: Off-and-on; consider relation to hemorrhoids versus other 3.  Dysphagia: With below consider stricture, dilated in 2018 which seemed to help 4.  GERD: Symptoms now controlled on Omeprazole 20 mg daily 5.  Generalized abdominal pain: Worse in the right lower quadrant which is chronic; consider relation to adhesions versus IBS  Plan: 1.  Discussed with patient that due to her new rectal bleeding and fecal incontinence as well as abdominal pain recommend that we go ahead and repeat her colonoscopy at this time.  Did discuss a rectal exam today but the patient declined.  She would rather this was done while she was asleep. 2.  Scheduled patient for a diagnostic colonoscopy and EGD with dilation in the [place] with Dr. Bryan Lemma as he had sooner availability than Dr. Carlean [name].  Did provide the patient with a detailed list of risks for the procedures and she agrees to proceed. Patient is appropriate for endoscopic procedure(s) in the ambulatory ([facility]) setting.  Patient will continue to follow with Dr. Carlean [name] after time procedures as her primary GI physician. 3.  Continue Omeprazole 20 mg daily 4.  Recommend the patient add Benefiber supplement once daily for the next week, then increase to twice daily to see if this helps with incontinence. 5.  Discussed that fecal incontinence could be due to hemorrhoids or poor sphincter tone, she may require manometry in the future and pelvic floor therapy 6.  Patient follow in clinic per recommendations from Dr. Bryan Lemma after time of procedures.  Ellouise Newer, PA-C [facility] 05/23/2021, 9:52 AM  Cc: Sheri Carbon, MD

## 2021-05-23 NOTE — Patient Instructions (Signed)
Start Benefiber or Citrucel 2 teaspoons in 8 ounces of liquid daily, then increase to twice daily after a week.   You have been scheduled for an endoscopy and colonoscopy. Please follow the written instructions given to you at your visit today. Please pick up your prep supplies at the pharmacy within the next 1-3 days. If you use inhalers (even only as needed), please bring them with you on the day of your procedure.  If you are age 68 or older, your body mass index should be between 23-30. Your Body mass index is 27.24 kg/m. If this is out of the aforementioned range listed, please consider follow up with your Primary Care Provider.  If you are age 93 or younger, your body mass index should be between 19-25. Your Body mass index is 27.24 kg/m. If this is out of the aformentioned range listed, please consider follow up with your Primary Care Provider.   __________________________________________________________  The [facility] providers would like to encourage you to use [facility] to communicate with providers for non-urgent requests or questions.  Due to long hold times on the telephone, sending your provider a message by [facility] may be a faster and more efficient way to get a response.  Please allow 48 business hours for a response.  Please remember that this is for non-urgent requests.

## 2021-05-27 ENCOUNTER — Ambulatory Visit (INDEPENDENT_AMBULATORY_CARE_PROVIDER_SITE_OTHER): Payer: BLUE CROSS/BLUE SHIELD | Admitting: Internal Medicine

## 2021-05-27 ENCOUNTER — Encounter: Payer: Self-pay | Admitting: Internal Medicine

## 2021-05-27 ENCOUNTER — Other Ambulatory Visit: Payer: Self-pay

## 2021-05-27 VITALS — BP 162/84 | HR 75 | Temp 97.1°F | Ht 67.0 in | Wt 176.0 lb

## 2021-05-27 DIAGNOSIS — I10 Essential (primary) hypertension: Secondary | ICD-10-CM

## 2021-05-27 DIAGNOSIS — Z23 Encounter for immunization: Secondary | ICD-10-CM | POA: Diagnosis not present

## 2021-05-27 LAB — RENAL FUNCTION PANEL
Albumin: 4.6 g/dL (ref 3.5–5.2)
BUN: 26 mg/dL — ABNORMAL HIGH (ref 6–23)
CO2: 29 mEq/L (ref 19–32)
Calcium: 9.8 mg/dL (ref 8.4–10.5)
Chloride: 102 mEq/L (ref 96–112)
Creatinine, Ser: 1.48 mg/dL — ABNORMAL HIGH (ref 0.40–1.20)
GFR: 36.37 mL/min — ABNORMAL LOW (ref >60.00)
Glucose, Bld: 104 mg/dL — ABNORMAL HIGH (ref 70–99)
Phosphorus: 3.2 mg/dL (ref 2.3–4.6)
Potassium: 5 mEq/L (ref 3.5–5.1)
Sodium: 139 mEq/L (ref 135–145)

## 2021-05-27 MED ORDER — AMLODIPINE BESYLATE 5 MG PO TABS
5.0000 mg | ORAL_TABLET | Freq: Every day | ORAL | 3 refills | Status: DC
Start: 2021-05-27 — End: 2024-04-25

## 2021-05-27 NOTE — Assessment & Plan Note (Signed)
BP Readings from Last 3 Encounters:  05/27/21 (!) 162/84  05/23/21 (!) 174/88  04/25/21 (!) 172/86   Slightly better but still too high Will check renal profile Add back amlodipine '5mg'$  daily

## 2021-05-27 NOTE — Patient Instructions (Signed)

## 2021-05-27 NOTE — Progress Notes (Signed)
Subjective:    Patient ID: Sheri [name], female    DOB: [birth year], 70 y.o.   MRN: [id number]  HPI Here for follow up of elevated blood pressure This visit occurred during the SARS-CoV-2 public health emergency.  Safety protocols were in place, including screening questions prior to the visit, additional usage of staff PPE, and extensive cleaning of exam room while observing appropriate contact time as indicated for disinfecting solutions.   She has not had any problems with the medication changes Wonders if drinking sweet sparkling water could be influencing her blood pressure--has still been elevated in the morning  Has been as high as Q000111Q systolic (but 60 diastolic)  Still notes thinning in her hair--even off the medications  Current Outpatient Medications on File Prior to Visit  Medication Sig Dispense Refill   aspirin 81 MG tablet Take 81 mg by mouth daily.     BIOTIN PO Take by mouth.     Bismuth Subsalicylate (PEPTO-BISMOL PO) Take 1 Dose by mouth as needed.     Calcium Carbonate Antacid (TUMS EXTRA STRENGTH 750 PO) Take 1-3 tablets by mouth as needed.     chlorthalidone (HYGROTON) 25 MG tablet Take 1 tablet (25 mg total) by mouth daily. 90 tablet 3   [facility] PAIN RELIEF 500 MG tablet TAKE 1 TABLET EVERY 4 TO 6 HOURS AS NEEDED  0   Cyanocobalamin (B-12 PO) Take by mouth.     cyclobenzaprine (FLEXERIL) 10 MG tablet TAKE 1/2 TO 1 TABLET(5 TO 10 MG) BY MOUTH AT BEDTIME AS NEEDED FOR MUSCLE SPASMS 30 tablet 1   GARLIC PO Take by mouth.     olmesartan (BENICAR) 40 MG tablet Take 1 tablet (40 mg total) by mouth daily. 90 tablet 3   omeprazole (PRILOSEC) 20 MG capsule Take 1 capsule (20 mg total) by mouth daily. 90 capsule 3   OVER THE COUNTER MEDICATION Super Beets Heart Chews     traMADol (ULTRAM) 50 MG tablet TAKE 1 TABLET(50 MG) BY MOUTH THREE TIMES DAILY AS NEEDED 90 tablet 0   Vitamin D, Cholecalciferol, 10 MCG (400 UNIT) TABS Take by mouth.     Wheat Dextrin (BENEFIBER PO) Take  by mouth.     Probiotic Product (PROBIOTIC PO) Take by mouth. (Patient not taking: Reported on 05/27/2021)     No current facility-administered medications on file prior to visit.    Allergies  Allergen Reactions   Codeine Nausea And Vomiting   Statins Other (See Comments)    Caused thinning hair/hair to fall out    Past Medical History:  Diagnosis Date   Allergy    GERD (gastroesophageal reflux disease)    Hypertension    IBS (irritable bowel syndrome)    Nocturia    Personal history of colonic adenomas 10/09/2013   Wrist fracture, left 1/14    Past Surgical History:  Procedure Laterality Date   ABDOMINAL HYSTERECTOMY     CARPAL TUNNEL RELEASE  1982   rt   CARPAL TUNNEL RELEASE Right 09/12/2013   Procedure: RIGHT CARPAL TUNNEL RELEASE;  Surgeon: Cammie Sickle., MD;  Location: [place];  Service: Orthopedics;  Laterality: Right;   COLONOSCOPY  2015, 2018   DORSAL COMPARTMENT RELEASE Left 03/16/2013   Procedure: LEFT FIRST RELEASE DORSAL COMPARTMENT (DEQUERVAIN) EXCISION OF CYST  A1 PULLEY LEFT THUMB;  Surgeon: Cammie Sickle., MD;  Location: [place];  Service: Orthopedics;  Laterality: Left;   ESOPHAGOGASTRODUODENOSCOPY (EGD) WITH ESOPHAGEAL DILATION  03/2017   ring   TONSILLECTOMY     TUBAL LIGATION      Family History  Problem Relation Age of Onset   Dementia Mother        Lewy body   Hypertension Sister    Cancer Sister        breast   Colon cancer Neg Hx    Esophageal cancer Neg Hx    Rectal cancer Neg Hx    Stomach cancer Neg Hx    Heart disease Neg Hx     Social History   Socioeconomic History   Marital status: Divorced    Spouse name: Not on file   Number of children: 2   Years of education: 13   Highest education level: Not on file  Occupational History   Occupation: [employer]    Comment: Medi  Tobacco Use   Smoking status: Former    Types: Cigarettes    Quit date: 10/13/2002    Years since quitting:  18.6   Smokeless tobacco: Never  Vaping Use   Vaping Use: Never used  Substance and Sexual Activity   Alcohol use: No   Drug use: No   Sexual activity: Never  Other Topics Concern   Not on file  Social History Narrative   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 Married '73 - 18 yr/divorced. 1 dtr - '82, 1 son - '73; 1 grandchild.    Lives in her own place      No formal living will   Son, then daughter, should make health care POA   Would accept resuscitation   Not sure about tube feeds--but wouldn't want if cognitively unaware   Social Determinants of Health   Financial Resource Strain: Not on file  Food Insecurity: Not on file  Transportation Needs: Not on file  Physical Activity: Not on file  Stress: Not on file  Social Connections: Not on file  Intimate Partner Violence: Not on file   Review of Systems No chest pain No SOB Some heartburn---but the omeprazole has helped     Objective:   Physical Exam Constitutional:      Appearance: Normal appearance.  Cardiovascular:     Rate and Rhythm: Normal rate and regular rhythm.     Heart sounds: No murmur heard.   No gallop.  Pulmonary:     Effort: Pulmonary effort is normal.     Breath sounds: Normal breath sounds. No wheezing or rales.  Musculoskeletal:     Cervical back: Neck supple.     Right lower leg: No edema.     Left lower leg: No edema.  Lymphadenopathy:     Cervical: No cervical adenopathy.  Neurological:     Mental Status: She is alert.           Assessment & Plan:

## 2021-05-27 NOTE — Addendum Note (Signed)
Addended by: Pilar Grammes on: 05/27/2021 08:04 AM   Modules accepted: Orders

## 2021-06-02 ENCOUNTER — Other Ambulatory Visit: Payer: Self-pay | Admitting: Internal Medicine

## 2021-06-02 NOTE — Telephone Encounter (Signed)
Last filled 05-01-21 #90 Last OV 05-27-21 Next OV 07-08-21 [facility] [address] [place]

## 2021-06-06 ENCOUNTER — Other Ambulatory Visit: Payer: Self-pay

## 2021-06-06 ENCOUNTER — Other Ambulatory Visit (INDEPENDENT_AMBULATORY_CARE_PROVIDER_SITE_OTHER): Payer: BLUE CROSS/BLUE SHIELD

## 2021-06-06 ENCOUNTER — Ambulatory Visit (AMBULATORY_SURGERY_CENTER): Payer: BLUE CROSS/BLUE SHIELD | Admitting: Gastroenterology

## 2021-06-06 ENCOUNTER — Encounter: Payer: Self-pay | Admitting: Gastroenterology

## 2021-06-06 VITALS — BP 171/68 | HR 84 | Temp 98.9°F | Resp 11 | Ht 67.0 in | Wt 176.0 lb

## 2021-06-06 DIAGNOSIS — R1031 Right lower quadrant pain: Secondary | ICD-10-CM

## 2021-06-06 DIAGNOSIS — R194 Change in bowel habit: Secondary | ICD-10-CM | POA: Diagnosis not present

## 2021-06-06 DIAGNOSIS — K2970 Gastritis, unspecified, without bleeding: Secondary | ICD-10-CM | POA: Diagnosis not present

## 2021-06-06 DIAGNOSIS — K2990 Gastroduodenitis, unspecified, without bleeding: Secondary | ICD-10-CM

## 2021-06-06 DIAGNOSIS — D122 Benign neoplasm of ascending colon: Secondary | ICD-10-CM | POA: Diagnosis not present

## 2021-06-06 DIAGNOSIS — K219 Gastro-esophageal reflux disease without esophagitis: Secondary | ICD-10-CM | POA: Diagnosis not present

## 2021-06-06 DIAGNOSIS — R1319 Other dysphagia: Secondary | ICD-10-CM

## 2021-06-06 DIAGNOSIS — R152 Fecal urgency: Secondary | ICD-10-CM | POA: Diagnosis not present

## 2021-06-06 DIAGNOSIS — K529 Noninfective gastroenteritis and colitis, unspecified: Secondary | ICD-10-CM | POA: Diagnosis not present

## 2021-06-06 DIAGNOSIS — K222 Esophageal obstruction: Secondary | ICD-10-CM

## 2021-06-06 DIAGNOSIS — K633 Ulcer of intestine: Secondary | ICD-10-CM | POA: Diagnosis not present

## 2021-06-06 DIAGNOSIS — K50919 Crohn's disease, unspecified, with unspecified complications: Secondary | ICD-10-CM | POA: Diagnosis not present

## 2021-06-06 DIAGNOSIS — K640 First degree hemorrhoids: Secondary | ICD-10-CM

## 2021-06-06 DIAGNOSIS — K6289 Other specified diseases of anus and rectum: Secondary | ICD-10-CM

## 2021-06-06 DIAGNOSIS — R159 Full incontinence of feces: Secondary | ICD-10-CM

## 2021-06-06 DIAGNOSIS — K319 Disease of stomach and duodenum, unspecified: Secondary | ICD-10-CM | POA: Diagnosis not present

## 2021-06-06 LAB — SEDIMENTATION RATE: Sed Rate: 40 mm/hr — ABNORMAL HIGH (ref 0–30)

## 2021-06-06 LAB — C-REACTIVE PROTEIN: CRP: <1 mg/dL (ref 0.5–20.0)

## 2021-06-06 MED ORDER — SODIUM CHLORIDE 0.9 % IV SOLN: 500.0000 mL | Freq: Once | INTRAVENOUS | Status: DC

## 2021-06-06 NOTE — Progress Notes (Signed)
GASTROENTEROLOGY PROCEDURE H&P NOTE   Primary Care Physician: Venia Carbon, MD    Reason for Procedure:   Dysphagia, history of colon polyps, fecal incontinence/fecal seepage, hematochezia, change in bowel habits, RLQ pain, GERD  Plan:    EGD with possible biopsy and dilation. Colonoscopy with possible biopsy and polypectomy  Patient is appropriate for endoscopic procedure(s) in the ambulatory ([facility]) setting.  The nature of the procedure, as well as the risks, benefits, and alternatives were carefully and thoroughly reviewed with the patient. Ample time for discussion and questions allowed. The patient understood, was satisfied, and agreed to proceed.     HPI: Sheri [name] is a 70 y.o. female who presents for EGD and Colonoscopy for evaluation of multiple GI sxs, to include GERD, dysphagia, RLQ pain, fecal incontinence/seepage, hematochezaia, change in stools, and history of colon polyps .  Patient was most recently seen in the Gastroenterology Clinic on 05/23/2021 by Ellouise Newer, PA-C.  No interval change in medical history since that appointment. Please refer to that note for full details regarding GI history and clinical presentation.   Past Medical History:  Diagnosis Date   Allergy    GERD (gastroesophageal reflux disease)    Hypertension    IBS (irritable bowel syndrome)    Nocturia    Personal history of colonic adenomas 10/09/2013   Wrist fracture, left 09/21/2012    Past Surgical History:  Procedure Laterality Date   ABDOMINAL HYSTERECTOMY     CARPAL TUNNEL RELEASE  1982   rt   CARPAL TUNNEL RELEASE Right 09/12/2013   Procedure: RIGHT CARPAL TUNNEL RELEASE;  Surgeon: Cammie Sickle., MD;  Location: [facility];  Service: Orthopedics;  Laterality: Right;   COLONOSCOPY  2015, 2018   DORSAL COMPARTMENT RELEASE Left 03/16/2013   Procedure: LEFT FIRST RELEASE DORSAL COMPARTMENT (DEQUERVAIN) EXCISION OF CYST  A1 PULLEY LEFT THUMB;  Surgeon:  Cammie Sickle., MD;  Location: [facility];  Service: Orthopedics;  Laterality: Left;   ESOPHAGOGASTRODUODENOSCOPY (EGD) WITH ESOPHAGEAL DILATION  03/2017   ring   TONSILLECTOMY     TUBAL LIGATION      Prior to Admission medications   Medication Sig Start Date End Date Taking? Authorizing Provider  amLODipine (NORVASC) 5 MG tablet Take 1 tablet (5 mg total) by mouth daily. 05/27/21  Yes Venia Carbon, MD  aspirin 81 MG tablet Take 81 mg by mouth daily.   Yes [provider]  Bismuth Subsalicylate (PEPTO-BISMOL PO) Take 1 Dose by mouth as needed.   Yes [provider]  chlorthalidone (HYGROTON) 25 MG tablet Take 1 tablet (25 mg total) by mouth daily. 04/25/21  Yes Venia Carbon, MD  Cyanocobalamin (B-12 PO) Take by mouth.   Yes [provider]  cyclobenzaprine (FLEXERIL) 10 MG tablet TAKE 1/2 TO 1 TABLET(5 TO 10 MG) BY MOUTH AT BEDTIME AS NEEDED FOR MUSCLE SPASMS 05/19/21  Yes Venia Carbon, MD  GARLIC PO Take by mouth.   Yes [provider]  olmesartan (BENICAR) 40 MG tablet Take 1 tablet (40 mg total) by mouth daily. 04/25/21  Yes Venia Carbon, MD  omeprazole (PRILOSEC) 20 MG capsule Take 1 capsule (20 mg total) by mouth daily. 04/25/21  Yes Venia Carbon, MD  traMADol (ULTRAM) 50 MG tablet Take 1 tablet (50 mg total) by mouth 3 (three) times daily as needed. 06/02/21  Yes Venia Carbon, MD  BIOTIN PO Take by mouth.    [provider]  Calcium Carbonate Antacid (TUMS EXTRA STRENGTH 750 PO) Take 1-3 tablets by mouth as needed.    [provider]  [employer] PAIN RELIEF 500 MG tablet TAKE 1 TABLET EVERY 4 TO 6 HOURS AS NEEDED 06/22/18   [provider]  OVER THE COUNTER MEDICATION Super Beets Heart Chews    [provider]  Probiotic Product (PROBIOTIC PO) Take by mouth. Patient not taking: No sig reported    [provider]  Vitamin D, Cholecalciferol, 10 MCG (400 UNIT) TABS Take by  mouth.    [provider]  Wheat Dextrin (BENEFIBER PO) Take by mouth.    [provider]    Current Outpatient Medications  Medication Sig Dispense Refill   amLODipine (NORVASC) 5 MG tablet Take 1 tablet (5 mg total) by mouth daily. 90 tablet 3   aspirin 81 MG tablet Take 81 mg by mouth daily.     Bismuth Subsalicylate (PEPTO-BISMOL PO) Take 1 Dose by mouth as needed.     chlorthalidone (HYGROTON) 25 MG tablet Take 1 tablet (25 mg total) by mouth daily. 90 tablet 3   Cyanocobalamin (B-12 PO) Take by mouth.     cyclobenzaprine (FLEXERIL) 10 MG tablet TAKE 1/2 TO 1 TABLET(5 TO 10 MG) BY MOUTH AT BEDTIME AS NEEDED FOR MUSCLE SPASMS 30 tablet 1   GARLIC PO Take by mouth.     olmesartan (BENICAR) 40 MG tablet Take 1 tablet (40 mg total) by mouth daily. 90 tablet 3   omeprazole (PRILOSEC) 20 MG capsule Take 1 capsule (20 mg total) by mouth daily. 90 capsule 3   traMADol (ULTRAM) 50 MG tablet Take 1 tablet (50 mg total) by mouth 3 (three) times daily as needed. 90 tablet 0   BIOTIN PO Take by mouth.     Calcium Carbonate Antacid (TUMS EXTRA STRENGTH 750 PO) Take 1-3 tablets by mouth as needed.     [employer] PAIN RELIEF 500 MG tablet TAKE 1 TABLET EVERY 4 TO 6 HOURS AS NEEDED  0   OVER THE COUNTER MEDICATION Super Beets Heart Chews     Probiotic Product (PROBIOTIC PO) Take by mouth. (Patient not taking: No sig reported)     Vitamin D, Cholecalciferol, 10 MCG (400 UNIT) TABS Take by mouth.     Wheat Dextrin (BENEFIBER PO) Take by mouth.     Current Facility-Administered Medications  Medication Dose Route Frequency Provider Last Rate Last Admin   0.9 %  sodium chloride infusion  500 mL Intravenous Once Ashish Rossetti V, DO        Allergies as of 06/06/2021 - Review Complete 06/06/2021  Allergen Reaction Noted   Codeine Nausea And Vomiting    Statins Other (See Comments) 04/25/2021    Family History  Problem Relation Age of Onset   Dementia Mother        Lewy body    Hypertension Sister    Cancer Sister        breast   Colon cancer Neg Hx    Esophageal cancer Neg Hx    Rectal cancer Neg Hx    Stomach cancer Neg Hx    Heart disease Neg Hx     Social History   Socioeconomic History   Marital status: Divorced    Spouse name: Not on file   Number of children: 2   Years of education: 13   Highest education level: Not on file  Occupational History   Occupation: [employer]    Comment:  Medi  Tobacco Use   Smoking status: Former    Types: Cigarettes    Quit date: 10/13/2002    Years since quitting: 18.6   Smokeless tobacco: Never  Vaping Use   Vaping Use: Never used  Substance and Sexual Activity   Alcohol use: No   Drug use: No   Sexual activity: Never  Other Topics Concern   Not on file  Social History Narrative   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 Married '73 - 18 yr/divorced. 1 dtr - '82, 1 son - '73; 1 grandchild.    Lives in her own place      No formal living will   Son, then daughter, should make health care POA   Would accept resuscitation   Not sure about tube feeds--but wouldn't want if cognitively unaware   Social Determinants of Health   Financial Resource Strain: Not on file  Food Insecurity: Not on file  Transportation Needs: Not on file  Physical Activity: Not on file  Stress: Not on file  Social Connections: Not on file  Intimate Partner Violence: Not on file    Physical Exam: Vital signs in last 24 hours: '@BP'$  (!) 162/79   Pulse 89   Temp 98.9 F (37.2 C)   Ht '5\' 7"'$  (1.702 m)   Wt 176 lb (79.8 kg)   SpO2 99%   BMI 27.57 kg/m  GEN: NAD EYE: Sclerae anicteric ENT: MMM CV: Non-tachycardic Pulm: CTA b/l GI: Soft, NT/ND NEURO:  Alert & Oriented x [place], DO [facility]   06/06/2021 2:40 PM

## 2021-06-06 NOTE — Progress Notes (Signed)
Agree with the assessment and plan as outlined by Jennifer Lemmon, PA-C. ? ?Rion Catala, DO, FACG ? ?

## 2021-06-06 NOTE — Op Note (Signed)
[facility] Patient Name: Jonet Mathies Procedure Date: 06/06/2021 2:36 PM MRN: [id number] Endoscopist: Gerrit Heck , MD Age: 70 Referring MD:  Date of Birth: [birth year] Gender: Female Account #: [id number] Procedure:                Colonoscopy Indications:              Abdominal pain in the right lower quadrant,                            Hematochezia, Change in bowel habits, Fecal                            incontinence/Fecal seepage Medicines:                Monitored Anesthesia Care Procedure:                Pre-Anesthesia Assessment:                           - Prior to the procedure, a History and Physical                            was performed, and patient medications and                            allergies were reviewed. The patient's tolerance of                            previous anesthesia was also reviewed. The risks                            and benefits of the procedure and the sedation                            options and risks were discussed with the patient.                            All questions were answered, and informed consent                            was obtained. Prior Anticoagulants: The patient has                            taken no previous anticoagulant or antiplatelet                            agents. ASA Grade Assessment: III - A patient with                            severe systemic disease. After reviewing the risks                            and benefits, the patient was deemed in  satisfactory condition to undergo the procedure.                           After obtaining informed consent, the colonoscope                            was passed under direct vision. Throughout the                            procedure, the patient's blood pressure, pulse, and                            oxygen saturations were monitored continuously. The                            Olympus CF-[id number]) Colonoscope  was                            introduced through the anus and advanced to the 2                            cm into the ileum. The colonoscopy was performed                            without difficulty. The patient tolerated the                            procedure well. The quality of the bowel                            preparation was good. The terminal ileum, ileocecal                            valve, appendiceal orifice, and rectum were                            photographed. Scope In: 3:06:56 PM Scope Out: 3:28:40 PM Scope Withdrawal Time: 0 hours 19 minutes 31 seconds  Total Procedure Duration: 0 hours 21 minutes 44 seconds  Findings:                 The perianal and digital rectal examinations were                            normal.                           Patchy mild inflammation characterized by                            congestion (edema), erythema, and a few shallow                            ulcerations was found in the ascending colon and in  the cecum. Biopsies were taken with a cold forceps                            for histology. Estimated blood loss was minimal.                           Diffuse inflammation characterized by loss of                            vascularity was found in the sigmoid colon, in the                            descending colon and in the transverse colon.                            Biopsies were taken with a cold forceps for                            histology. Estimated blood loss was minimal.                           A 4 mm polyp was found in the ascending colon. The                            polyp was sessile. The polyp was removed with a                            cold snare. Resection and retrieval were complete.                            Estimated blood loss was minimal.                           Non-bleeding internal hemorrhoids and small                            hypertrophied anal papillae were  found during                            retroflexion. The hemorrhoids were small.                           Could only advance the colonsocopy 2-3 cm into the                            terminal ileum due to excessive looping in the                            ascending colon. The ileum appeared normal, albeit                            on very limited views.  The ascending colon revealed significantly                            excessive looping, which precluded advancement into                            the terminal ileum as above. Complications:            No immediate complications. Estimated Blood Loss:     Estimated blood loss was minimal. Impression:               - Patchy mild inflammation was found in the                            ascending colon and in the cecum. Biopsied.                           - Diffuse inflammation was found in the sigmoid                            colon, in the descending colon and in the                            transverse colon. Biopsied.                           - One 4 mm polyp in the ascending colon, removed                            with a cold snare. Resected and retrieved.                           - Non-bleeding internal hemorrhoids.                           - The limited examined portion of the ileum was                            normal.                           - There was significant looping of the colon. Recommendation:           - Patient has a contact number available for                            emergencies. The signs and symptoms of potential                            delayed complications were discussed with the                            patient. Return to normal activities tomorrow.                            Written discharge instructions were  provided to the                            patient.                           - Continue present medications.                           - Await pathology  results.                           - Repeat colonoscopy for surveillance based on                            pathology results.                           - Follow-up with Ellouise Newer, PA-C or Dr.                            Carlean Purl in the GI office at appointment to be                            scheduled.                           - Check ESR, CRP, fecal calprotectin at the next                            available appointment.                           - Depending on biopsy results, may consider CT                            Enterography for further evaluation of small bowel                            inflammation. Gerrit Heck, MD 06/06/2021 3:47:41 PM

## 2021-06-06 NOTE — Progress Notes (Signed)
Called to room to assist during endoscopic procedure.  Patient ID and intended procedure confirmed with present staff. Received instructions for my participation in the procedure from the performing physician.  

## 2021-06-06 NOTE — Op Note (Signed)
[facility] Patient Name: Sheri [name] Procedure Date: 06/06/2021 2:36 PM MRN: [id number] Endoscopist: Gerrit Heck , MD Age: 70 Referring MD:  Date of Birth: [birth year] Gender: Female Account #: [id number] Procedure:                Upper GI endoscopy Indications:              Dysphagia, Esophageal reflux, Abdominal bloating,                            Change in bowel habits Medicines:                Monitored Anesthesia Care Procedure:                Pre-Anesthesia Assessment:                           - Prior to the procedure, a History and Physical                            was performed, and patient medications and                            allergies were reviewed. The patient's tolerance of                            previous anesthesia was also reviewed. The risks                            and benefits of the procedure and the sedation                            options and risks were discussed with the patient.                            All questions were answered, and informed consent                            was obtained. Prior Anticoagulants: The patient has                            taken no previous anticoagulant or antiplatelet                            agents. ASA Grade Assessment: III - A patient with                            severe systemic disease. After reviewing the risks                            and benefits, the patient was deemed in                            satisfactory condition to undergo the procedure.  After obtaining informed consent, the endoscope was                            passed under direct vision. Throughout the                            procedure, the patient's blood pressure, pulse, and                            oxygen saturations were monitored continuously. The                            GIF [id number] [id number] was introduced through the                            mouth, and advanced to the  second part of duodenum.                            The upper GI endoscopy was accomplished without                            difficulty. The patient tolerated the procedure                            fairly well. Did have to place a nasal trumpet in                            the left nares, which resulted in brief epistaxis.                            This stopped bleeding prior to the conclusion of                            today's procedure. Scope In: Scope Out: Findings:                 A mild Schatzki ring was found in the lower third                            of the esophagus. A TTS dilator was passed through                            the scope. Dilation with an 18-19-20 mm balloon                            dilator was performed to 19 mm. The dilation site                            was examined and showed mild mucosal disruption.                            Estimated blood loss was minimal.  The upper third of the esophagus, middle third of                            the esophagus, and Z line were normal.                           Scattered mild inflammation characterized by                            erythema was found in the gastric body and in the                            gastric antrum. Biopsies were taken with a cold                            forceps for Helicobacter pylori testing. Estimated                            blood loss was minimal.                           The examined duodenum was normal. Biopsies for                            histology were taken with a cold forceps for                            evaluation of celiac disease. Estimated blood loss                            was minimal. Complications:            No immediate complications. Estimated Blood Loss:     Estimated blood loss was minimal. Impression:               - Mild Schatzki ring. Dilated with 19 mm TTS                            balloon with appropriate mucosal  rent.                           - Normal upper third of esophagus and middle third                            of esophagus.                           - Gastritis. Biopsied.                           - Normal examined duodenum. Biopsied. Recommendation:           - Patient has a contact number available for                            emergencies. The signs and symptoms  of potential                            delayed complications were discussed with the                            patient. Return to normal activities tomorrow.                            Written discharge instructions were provided to the                            patient.                           - Soft diet today, then advance slowly as tolerated                            tomorrow per post dilation protocol.                           - Continue present medications.                           - Await pathology results.                           - Repeat upper endoscopy PRN.                           - Colonoscopy today. Gerrit Heck, MD 06/06/2021 3:37:36 PM

## 2021-06-06 NOTE — Patient Instructions (Signed)
Thank you for letting us take care of your healthcare needs today. Please see handouts given to you on Polyps, Gastritis and Post dilation diet.    YOU HAD AN ENDOSCOPIC PROCEDURE TODAY AT [facility]:   Refer to the procedure report that was given to you for any specific questions about what was found during the examination.  If the procedure report does not answer your questions, please call your gastroenterologist to clarify.  If you requested that your care partner not be given the details of your procedure findings, then the procedure report has been included in a sealed envelope for you to review at your convenience later.  YOU SHOULD EXPECT: Some feelings of bloating in the abdomen. Passage of more gas than usual.  Walking can help get rid of the air that was put into your GI tract during the procedure and reduce the bloating. If you had a lower endoscopy (such as a colonoscopy or flexible sigmoidoscopy) you may notice spotting of blood in your stool or on the toilet paper. If you underwent a bowel prep for your procedure, you may not have a normal bowel movement for a few days.  Please Note:  You might notice some irritation and congestion in your nose or some drainage.  This is from the oxygen used during your procedure.  There is no need for concern and it should clear up in a day or so.  SYMPTOMS TO REPORT IMMEDIATELY:  Following lower endoscopy (colonoscopy or flexible sigmoidoscopy):  Excessive amounts of blood in the stool  Significant tenderness or worsening of abdominal pains  Swelling of the abdomen that is new, acute  Fever of 100F or higher  Following upper endoscopy (EGD)  Vomiting of blood or coffee ground material  New chest pain or pain under the shoulder blades  Painful or persistently difficult swallowing  New shortness of breath  Fever of 100F or higher  Black, tarry-looking stools  For urgent or emergent issues, a gastroenterologist can be  reached at any hour by calling [phone]. Do not use MyChart messaging for urgent concerns.    DIET:  Clear liquids today until 4:30 then soft diet for the rest of the day. Tomorrow you may proceed to your regular diet.  Drink plenty of fluids but you should avoid alcoholic beverages for 24 hours.  ACTIVITY:  You should plan to take it easy for the rest of today and you should NOT DRIVE or use heavy machinery until tomorrow (because of the sedation medicines used during the test).    FOLLOW UP: Our staff will call the number listed on your records 48-72 hours following your procedure to check on you and address any questions or concerns that you may have regarding the information given to you following your procedure. If we do not reach you, we will leave a message.  We will attempt to reach you two times.  During this call, we will ask if you have developed any symptoms of COVID 19. If you develop any symptoms (ie: fever, flu-like symptoms, shortness of breath, cough etc.) before then, please call [phone].  If you test positive for Covid 19 in the 2 weeks post procedure, please call and report this information to [place].    If any biopsies were taken you will be contacted by phone or by letter within the next 1-3 weeks.  Please call us at [phone] if you have not heard about the biopsies in 3 weeks.  SIGNATURES/CONFIDENTIALITY: You and/or your care partner have signed paperwork which will be entered into your electronic medical record.  These signatures attest to the fact that that the information above on your After Visit Summary has been reviewed and is understood.  Full responsibility of the confidentiality of this discharge information lies with you and/or your care-partner.

## 2021-06-06 NOTE — Progress Notes (Signed)
VS taken by DT 

## 2021-06-06 NOTE — Progress Notes (Signed)
Sedate, gd SR, tolerated procedure well, VSS, report to RN 

## 2021-06-09 ENCOUNTER — Telehealth: Payer: Self-pay

## 2021-06-09 NOTE — Telephone Encounter (Signed)
Per 06/06/21 procedure report - Follow up with Sheri Newer, PA-C or Dr. Carlean [name] in the GI office at appt to be scheduled.   Patient has been scheduled for a follow up with Dr. Carlean [name] on Wednesday, 07/16/21 at 10:50 am. Appt information mailed to patient.

## 2021-06-10 ENCOUNTER — Telehealth: Payer: Self-pay

## 2021-06-10 ENCOUNTER — Other Ambulatory Visit: Payer: Medicare Other

## 2021-06-10 DIAGNOSIS — K50919 Crohn's disease, unspecified, with unspecified complications: Secondary | ICD-10-CM

## 2021-06-10 NOTE — Telephone Encounter (Signed)
  Follow up Call-  Call back number 06/06/2021  Post procedure Call Back phone  # [phone]  Permission to leave phone message Yes  Some recent data might be hidden     Patient questions:  Do you have a fever, pain , or abdominal swelling? No. Pain Score  0 *  Have you tolerated food without any problems? Yes.    Have you been able to return to your normal activities? Yes.    Do you have any questions about your discharge instructions: Diet   No. Medications  No. Follow up visit  No.  Do you have questions or concerns about your Care? No.  Actions: * If pain score is 4 or above: No action needed, pain <4.   Per Pt "everyone did a great job". Maw  Have you developed a fever since your procedure? no  2.   Have you had an respiratory symptoms (SOB or cough) since your procedure? no  3.   Have you tested positive for COVID 19 since your procedure no  4.   Have you had any family members/close contacts diagnosed with the COVID 19 since your procedure?  no   If yes to any of these questions please route to Joylene John, RN and Joella Prince, RN

## 2021-06-16 ENCOUNTER — Encounter: Payer: Self-pay | Admitting: Gastroenterology

## 2021-06-16 LAB — CALPROTECTIN: Calprotectin: 97 mcg/g

## 2021-07-02 ENCOUNTER — Other Ambulatory Visit: Payer: Self-pay | Admitting: Internal Medicine

## 2021-07-02 NOTE — Telephone Encounter (Signed)
Last filled 06-02-21 #90 Last OV 05-27-21 Next OV 07-08-21 [facility] [address]

## 2021-07-08 ENCOUNTER — Other Ambulatory Visit: Payer: Self-pay

## 2021-07-08 ENCOUNTER — Encounter: Payer: Self-pay | Admitting: Internal Medicine

## 2021-07-08 ENCOUNTER — Ambulatory Visit (INDEPENDENT_AMBULATORY_CARE_PROVIDER_SITE_OTHER): Payer: BLUE CROSS/BLUE SHIELD | Admitting: Internal Medicine

## 2021-07-08 DIAGNOSIS — I10 Essential (primary) hypertension: Secondary | ICD-10-CM

## 2021-07-08 NOTE — Progress Notes (Signed)
Subjective:    Patient ID: Sheri [name], female    DOB: [birth year], 70 y.o.   MRN: [id number]  HPI Here due to ongoing blood pressure problems This visit occurred during the SARS-CoV-2 public health emergency.  Safety protocols were in place, including screening questions prior to the visit, additional usage of staff PPE, and extensive cleaning of exam room while observing appropriate contact time as indicated for disinfecting solutions.   Has gotten as low as 127/57 at home But mostly 364-680 systolic Has been consistent with her medication  No chest pain Some DOE--if walks rapidly. This seems better now lately No edema No dizziness or syncope  Current Outpatient Medications on File Prior to Visit  Medication Sig Dispense Refill   amLODipine (NORVASC) 5 MG tablet Take 1 tablet (5 mg total) by mouth daily. 90 tablet 3   aspirin 81 MG tablet Take 81 mg by mouth daily.     BIOTIN PO Take by mouth.     Bismuth Subsalicylate (PEPTO-BISMOL PO) Take 1 Dose by mouth as needed.     Calcium Carbonate Antacid (TUMS EXTRA STRENGTH 750 PO) Take 1-3 tablets by mouth as needed.     chlorthalidone (HYGROTON) 25 MG tablet Take 1 tablet (25 mg total) by mouth daily. 90 tablet 3   [facility] PAIN RELIEF 500 MG tablet TAKE 1 TABLET EVERY 4 TO 6 HOURS AS NEEDED  0   Cyanocobalamin (B-12 PO) Take by mouth.     cyclobenzaprine (FLEXERIL) 10 MG tablet TAKE 1/2 TO 1 TABLET(5 TO 10 MG) BY MOUTH AT BEDTIME AS NEEDED FOR MUSCLE SPASMS 30 tablet 1   GARLIC PO Take by mouth.     Multiple Vitamins-Minerals (ZINC PO) Take by mouth.     olmesartan (BENICAR) 40 MG tablet Take 1 tablet (40 mg total) by mouth daily. 90 tablet 3   omeprazole (PRILOSEC) 20 MG capsule Take 1 capsule (20 mg total) by mouth daily. 90 capsule 3   OVER THE COUNTER MEDICATION Super Beets Heart Chews     Probiotic Product (PROBIOTIC PO) Take by mouth.     traMADol (ULTRAM) 50 MG tablet TAKE 1 TABLET(50 MG) BY MOUTH THREE TIMES DAILY AS NEEDED 90  tablet 0   Vitamin D, Cholecalciferol, 10 MCG (400 UNIT) TABS Take by mouth.     Wheat Dextrin (BENEFIBER PO) Take by mouth.     No current facility-administered medications on file prior to visit.    Allergies  Allergen Reactions   Codeine Nausea And Vomiting   Statins Other (See Comments)    Caused thinning hair/hair to fall out    Past Medical History:  Diagnosis Date   Allergy    GERD (gastroesophageal reflux disease)    Hypertension    IBS (irritable bowel syndrome)    Nocturia    Personal history of colonic adenomas 10/09/2013   Tubular adenoma    Wrist fracture, left 09/21/2012    Past Surgical History:  Procedure Laterality Date   ABDOMINAL HYSTERECTOMY     CARPAL TUNNEL RELEASE  1982   rt   CARPAL TUNNEL RELEASE Right 09/12/2013   Procedure: RIGHT CARPAL TUNNEL RELEASE;  Surgeon: Cammie Sickle., MD;  Location: [facility];  Service: Orthopedics;  Laterality: Right;   COLONOSCOPY  2015, 2018   DORSAL COMPARTMENT RELEASE Left 03/16/2013   Procedure: LEFT FIRST RELEASE DORSAL COMPARTMENT (DEQUERVAIN) EXCISION OF CYST  A1 PULLEY LEFT THUMB;  Surgeon: Cammie Sickle., MD;  Location: [facility];  Service: Orthopedics;  Laterality: Left;   ESOPHAGOGASTRODUODENOSCOPY (EGD) WITH ESOPHAGEAL DILATION  03/2017   ring   TONSILLECTOMY     TUBAL LIGATION      Family History  Problem Relation Age of Onset   Dementia Mother        Lewy body   Hypertension Sister    Cancer Sister        breast   Colon cancer Neg Hx    Esophageal cancer Neg Hx    Rectal cancer Neg Hx    Stomach cancer Neg Hx    Heart disease Neg Hx     Social History   Socioeconomic History   Marital status: Divorced    Spouse name: Not on file   Number of children: 2   Years of education: 13   Highest education level: Not on file  Occupational History   Occupation: [employer]    Comment: Medi  Tobacco Use   Smoking status: Former    Types: Cigarettes     Quit date: 10/13/2002    Years since quitting: 18.7   Smokeless tobacco: Never  Vaping Use   Vaping Use: Never used  Substance and Sexual Activity   Alcohol use: No   Drug use: No   Sexual activity: Never  Other Topics Concern   Not on file  Social History Narrative   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 Married '73 - 18 yr/divorced. 1 dtr - '82, 1 son - '73; 1 grandchild.    Lives in her own place      No formal living will   Son, then daughter, should make health care POA   Would accept resuscitation   Not sure about tube feeds--but wouldn't want if cognitively unaware   Social Determinants of Health   Financial Resource Strain: Not on file  Food Insecurity: Not on file  Transportation Needs: Not on file  Physical Activity: Not on file  Stress: Not on file  Social Connections: Not on file  Intimate Partner Violence: Not on file   Review of Systems No change in poor sleep Nocturia x 2-3     Objective:   Physical Exam Constitutional:      Appearance: Normal appearance.  Cardiovascular:     Rate and Rhythm: Normal rate and regular rhythm.     Heart sounds: No murmur heard.   No gallop.  Pulmonary:     Effort: Pulmonary effort is normal.     Breath sounds: Normal breath sounds. No wheezing or rales.  Musculoskeletal:     Cervical back: Neck supple.     Right lower leg: No edema.     Left lower leg: No edema.  Lymphadenopathy:     Cervical: No cervical adenopathy.  Neurological:     Mental Status: She is alert.           Assessment & Plan:

## 2021-07-08 NOTE — Patient Instructions (Signed)
Please make your follow up appointment with Dr Joylene Grapes in the near future

## 2021-07-08 NOTE — Assessment & Plan Note (Signed)
BP Readings from Last 3 Encounters:  07/08/21 (!) 160/78  06/06/21 (!) 171/68  05/27/21 (!) 162/84   Repeat by me was the same on right Her cuff 183/74  No better with amlodipine May need to consider trial of spironolactone but I am concerned about her renal function and ARB use as well Will have her go back to Dr Joylene Grapes ASAP

## 2021-07-11 DIAGNOSIS — N1831 Chronic kidney disease, stage 3a: Secondary | ICD-10-CM | POA: Diagnosis not present

## 2021-07-11 DIAGNOSIS — M797 Fibromyalgia: Secondary | ICD-10-CM | POA: Diagnosis not present

## 2021-07-11 DIAGNOSIS — I129 Hypertensive chronic kidney disease with stage 1 through stage 4 chronic kidney disease, or unspecified chronic kidney disease: Secondary | ICD-10-CM | POA: Diagnosis not present

## 2021-07-11 DIAGNOSIS — E669 Obesity, unspecified: Secondary | ICD-10-CM | POA: Diagnosis not present

## 2021-07-16 ENCOUNTER — Encounter: Payer: Self-pay | Admitting: Internal Medicine

## 2021-07-16 ENCOUNTER — Ambulatory Visit (INDEPENDENT_AMBULATORY_CARE_PROVIDER_SITE_OTHER): Payer: BLUE CROSS/BLUE SHIELD | Admitting: Internal Medicine

## 2021-07-16 VITALS — BP 140/80 | HR 78 | Ht 67.0 in | Wt 173.2 lb

## 2021-07-16 DIAGNOSIS — R1031 Right lower quadrant pain: Secondary | ICD-10-CM | POA: Diagnosis not present

## 2021-07-16 DIAGNOSIS — R32 Unspecified urinary incontinence: Secondary | ICD-10-CM | POA: Diagnosis not present

## 2021-07-16 DIAGNOSIS — R159 Full incontinence of feces: Secondary | ICD-10-CM | POA: Diagnosis not present

## 2021-07-16 DIAGNOSIS — R197 Diarrhea, unspecified: Secondary | ICD-10-CM

## 2021-07-16 DIAGNOSIS — R1032 Left lower quadrant pain: Secondary | ICD-10-CM

## 2021-07-16 DIAGNOSIS — M797 Fibromyalgia: Secondary | ICD-10-CM

## 2021-07-16 DIAGNOSIS — G8929 Other chronic pain: Secondary | ICD-10-CM

## 2021-07-16 NOTE — Progress Notes (Signed)
Sheri [name] 70 y.o. 12-25-1950 [id number]  Assessment & Plan:   Encounter Diagnoses  Name Primary?   Full incontinence of feces Yes   Anal sphincter incontinence    Diarrhea - episodic    Urinary incontinence - stress and non-stress    Abdominal pain, chronic, bilateral lower quadrant    Fibromyalgia      Eliminate sugary drinks for better metabolic health Increase fiber in diet, handout provided PT referral for pelvic floor physical therapy to try to see if they can help her increase her anal tone and other maneuvers to try to eliminate incontinence of feces and perhaps urine  Follow-up with Dr. Gertie Fey to see if any gynecologic intervention is necessary question does she need to see urogynecology.  Hopefully physical therapy will make a difference.  Cc: Everlene Farrier, MD   Subjective:   Chief Complaint:  HPI Patient was seen by Ellouise Newer within the past couple of months with complaints of abdominal pain and loose stools.  EGD and colonoscopy were performed by Dr. Bryan Lemma in September with the results as below.  Colonoscopy 06/06/2021 - Patchy mild inflammation was found in the ascending colon and in the cecum. Biopsied. - Diffuse inflammation was found in the sigmoid colon, in the descending colon and in the transverse colon. Biopsied. - One 4 mm polyp in the ascending colon, removed with a cold snare. Resected and retrieved. - Non-bleeding internal hemorrhoids. - The limited examined portion of the ileum was normal. - There was significant looping of the colon.  Diagnosis 1. Surgical [P], duodenal bx - BENIGN SMALL BOWEL MUCOSA. - NO VILLOUS BLUNTING OR INCREASE IN INTRAEPITHELIAL LYMPHOCYTES. - NO DYSPLASIA OR MALIGNANCY. 2. Surgical [P], random gastric - MILD REACTIVE GASTROPATHY. [place] IS NEGATIVE FOR HELICOBACTER PYLORI. - NO INTESTINAL METAPLASIA, DYSPLASIA, OR MALIGNANCY. 3. Surgical [P], colon, cecum and ascending bx - BENIGN  COLONIC MUCOSA. - NO ACTIVE INFLAMMATION OR EVIDENCE OF MICROSCOPIC COLITIS. - NO DYSPLASIA OR MALIGNANCY. 4. Surgical [P], colon, ascending, polyp (1) - TUBULAR ADENOMA. - NO HIGH GRADE DYSPLASIA OR MALIGNANCY. 5. Surgical [P], colon nos, random sites - BENIGN COLONIC MUCOSA. - NO ACTIVE INFLAMMATION OR EVIDENCE OF MICROSCOPIC COLITIS. - NO DYSPLASIA OR MALIGNANCY.  EGD 06/06/2021 - Mild Schatzki ring. Dilated with 19 mm TTS balloon with appropriate mucosal rent. - Normal upper third of esophagus and middle third of esophagus. - Gastritis. Biopsied. - Normal examined duodenum. Biopsied.  She reports chronic bilateral lower quadrant pain there most of the time it may disrupt sleep.  She also has fibromyalgia with multiple aches and complaints.  She has chronic stress and none stress urinary incontinence and wears a pad.  She has been normal bowel movements most of the time she says but she has these unpredictable episodes with urgency and sometimes no warning and fecal incontinence.  She has had 3 since September.  These are quite bothersome and disruptive to quality of life.  Birth history is that of 2 live births both of which had episiotomies at least.   Drinks cranberry juice mixed with flavored water that is carbonated.  Also drinks sweetened sodas though not as many as she used to. Allergies  Allergen Reactions   Codeine Nausea And Vomiting   Statins Other (See Comments)    Caused thinning hair/hair to fall out   Current Meds  Medication Sig   amLODipine (NORVASC) 5 MG tablet Take 1 tablet (5 mg total) by mouth daily.   aspirin 81 MG tablet Take  81 mg by mouth daily.   BIOTIN PO Take by mouth.   chlorthalidone (HYGROTON) 25 MG tablet Take 1 tablet (25 mg total) by mouth daily.   Cyanocobalamin (B-12 PO) Take by mouth.   cyclobenzaprine (FLEXERIL) 10 MG tablet TAKE 1/2 TO 1 TABLET(5 TO 10 MG) BY MOUTH AT BEDTIME AS NEEDED FOR MUSCLE SPASMS   GARLIC PO Take by mouth.    Multiple Vitamins-Minerals (ZINC PO) Take by mouth.   olmesartan (BENICAR) 40 MG tablet Take 1 tablet (40 mg total) by mouth daily.   omeprazole (PRILOSEC) 20 MG capsule Take 1 capsule (20 mg total) by mouth daily.   OVER THE COUNTER MEDICATION Super Beets Heart Chews   traMADol (ULTRAM) 50 MG tablet TAKE 1 TABLET(50 MG) BY MOUTH THREE TIMES DAILY AS NEEDED   Vitamin D, Cholecalciferol, 10 MCG (400 UNIT) TABS Take by mouth.   Wheat Dextrin (BENEFIBER PO) Take by mouth.   Past Medical History:  Diagnosis Date   Allergy    GERD (gastroesophageal reflux disease)    Hypertension    IBS (irritable bowel syndrome)    Nocturia    Personal history of colonic adenomas 10/09/2013   Tubular adenoma    Wrist fracture, left 09/21/2012   Past Surgical History:  Procedure Laterality Date   ABDOMINAL HYSTERECTOMY     CARPAL TUNNEL RELEASE  1982   rt   CARPAL TUNNEL RELEASE Right 09/12/2013   Procedure: RIGHT CARPAL TUNNEL RELEASE;  Surgeon: Cammie Sickle., MD;  Location: [facility];  Service: Orthopedics;  Laterality: Right;   COLONOSCOPY  2015, 2018   DORSAL COMPARTMENT RELEASE Left 03/16/2013   Procedure: LEFT FIRST RELEASE DORSAL COMPARTMENT (DEQUERVAIN) EXCISION OF CYST  A1 PULLEY LEFT THUMB;  Surgeon: Cammie Sickle., MD;  Location: [facility];  Service: Orthopedics;  Laterality: Left;   ESOPHAGOGASTRODUODENOSCOPY (EGD) WITH ESOPHAGEAL DILATION  03/2017   ring   TONSILLECTOMY     TUBAL LIGATION     Social History   Social History Narrative   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 Married '73 - 18 yr/divorced. 1 dtr - '82, 1 son - '73; 1 grandchild.    Lives in her own place      No formal living will   Son, then daughter, should make health care POA   Would accept resuscitation   Not sure about tube feeds--but wouldn't want if cognitively unaware   family history includes Cancer in her sister; Dementia in her mother; Hypertension in her  sister.   Review of Systems  As per HPI Objective:   Physical Exam BP 140/80   Pulse 78   Ht 5\' 7"  (1.702 m)   Wt 173 lb 3.2 oz (78.6 kg)   SpO2 98%   BMI 27.13 kg/m  Abd diffusely tender soft without mass no rebound  Patti Martinique, CMA present for rectal exam  Anoderm with a tiny external pile in right posterior position that is easily reducible Decreased anal sphincter tone at rest and with voluntary contraction Formed brown stool Simulated defecation NL

## 2021-07-16 NOTE — Patient Instructions (Signed)
Try to eliminate sugary drinks. Eat more fresh veggies.   Please make a follow up appointment with Dr Gertie Fey.  We placed a referral for pelvic floor physical therapy. They will call you to set this up.   I appreciate the opportunity to care for you. Silvano Rusk, MD, [facility]

## 2021-07-18 ENCOUNTER — Other Ambulatory Visit: Payer: Self-pay | Admitting: Internal Medicine

## 2021-07-18 NOTE — Telephone Encounter (Signed)
Last filled 06-18-21 #30 Last OV 07-08-21 Next OV 10-08-21 [name], Sheri - [address] AT [place]

## 2021-08-01 ENCOUNTER — Other Ambulatory Visit: Payer: Self-pay | Admitting: Internal Medicine

## 2021-08-01 DIAGNOSIS — I129 Hypertensive chronic kidney disease with stage 1 through stage 4 chronic kidney disease, or unspecified chronic kidney disease: Secondary | ICD-10-CM | POA: Diagnosis not present

## 2021-08-01 DIAGNOSIS — N1831 Chronic kidney disease, stage 3a: Secondary | ICD-10-CM

## 2021-08-01 NOTE — Telephone Encounter (Signed)
Last filled 07-02-21 #90 Last OV 07-08-21 Next OV 10-08-21 [facility] [address]

## 2021-08-07 ENCOUNTER — Ambulatory Visit: Payer: BLUE CROSS/BLUE SHIELD | Attending: Internal Medicine | Admitting: Physical Therapy

## 2021-08-07 ENCOUNTER — Other Ambulatory Visit: Payer: Self-pay

## 2021-08-07 DIAGNOSIS — R159 Full incontinence of feces: Secondary | ICD-10-CM | POA: Insufficient documentation

## 2021-08-07 DIAGNOSIS — R279 Unspecified lack of coordination: Secondary | ICD-10-CM | POA: Diagnosis not present

## 2021-08-07 DIAGNOSIS — M6281 Muscle weakness (generalized): Secondary | ICD-10-CM | POA: Diagnosis not present

## 2021-08-07 NOTE — Therapy (Signed)
[facility] [address] [place], [place], [address] Phone: [phone]   Fax:  [phone]  Physical Therapy Evaluation  Patient Details  Name: Sheri [name] MRN: [id number] Date of Birth: Mar 06, 1951 Referring Provider (PT): Gatha Mayer, MD   Encounter Date: 08/07/2021   PT End of Session - 08/07/21 1002     Visit Number 1    Date for PT Re-Evaluation 12/05/21    Authorization Type medicare/BCBS    Progress Note Due on Visit 10    PT Start Time 0845    PT Stop Time 0929    PT Time Calculation (min) 44 min    Activity Tolerance Patient tolerated treatment well    Behavior During Therapy [facility] for tasks assessed/performed             Past Medical History:  Diagnosis Date   Allergy    GERD (gastroesophageal reflux disease)    Hypertension    IBS (irritable bowel syndrome)    Nocturia    Personal history of colonic adenomas 10/09/2013   Tubular adenoma    Wrist fracture, left 09/21/2012    Past Surgical History:  Procedure Laterality Date   ABDOMINAL HYSTERECTOMY     CARPAL TUNNEL RELEASE  1982   rt   CARPAL TUNNEL RELEASE Right 09/12/2013   Procedure: RIGHT CARPAL TUNNEL RELEASE;  Surgeon: Cammie Sickle., MD;  Location: [facility];  Service: Orthopedics;  Laterality: Right;   COLONOSCOPY  2015, 2018   DORSAL COMPARTMENT RELEASE Left 03/16/2013   Procedure: LEFT FIRST RELEASE DORSAL COMPARTMENT (DEQUERVAIN) EXCISION OF CYST  A1 PULLEY LEFT THUMB;  Surgeon: Cammie Sickle., MD;  Location: [facility];  Service: Orthopedics;  Laterality: Left;   ESOPHAGOGASTRODUODENOSCOPY (EGD) WITH ESOPHAGEAL DILATION  03/2017   ring   TONSILLECTOMY     TUBAL LIGATION      There were no vitals filed for this visit.    Subjective Assessment - 08/07/21 0848     Subjective Pt reports she is unable to control bowels smearing mostly usually every couple of weeks. Pt reports most of  the time she is unaware when has the smearing until she goes to the bathroom. Pt reports 1-2BMs per day in AM, does feel like she fully empties, sometimes needs to strain to push stool out, Types usually 2-5 Bristol stool scale. Pt does have burning and she states this is when she knows there is incontinence as the stool has been there without her knowing. Urinary leakage daily several times per day, with laughing/sneezing/coughing, going to standing, no longer able to hold urine, can't get to bathroom quick enough and leaks on the way to bathroom. Pt also does have pain with lower abdominal middle and Rt and does happen daily without injury.    Pertinent History Fibromyalgia, Urinary incontinence, Non-bleeding internal hemorrhoids, x3 vaginal births with episiotomies (2 living children), hysterectomy    How long can you sit comfortably? no limits    How long can you stand comfortably? no limits    How long can you walk comfortably? no limits    Patient Stated Goals to have less leakage    Currently in Pain? No/denies                [facility] PT Assessment - 08/07/21 0001       Assessment   Medical Diagnosis R15.9 (ICD-10-CM) - Full incontinence of feces  R15.9 (ICD-10-CM) - Anal sphincter  incontinence    Referring Provider (PT) Gatha Mayer, MD    Onset Date/Surgical Date --   several years   Prior Therapy no      Precautions   Precautions None      Restrictions   Weight Bearing Restrictions No      Balance Screen   Has the patient fallen in the past 6 months No    Has the patient had a decrease in activity level because of a fear of falling?  No    Is the patient reluctant to leave their home because of a fear of falling?  No      Home Ecologist residence    Living Arrangements Alone      Prior Function   Level of Independence Independent    Vocation Full time employment      Cognition   Overall Cognitive Status Within Functional Limits for  tasks assessed      Sensation   Light Touch Appears Intact      Coordination   Gross Motor Movements are Fluid and Coordinated Yes    Fine Motor Movements are Fluid and Coordinated Yes      Posture/Postural Control   Posture/Postural Control Postural limitations    Postural Limitations Rounded Shoulders;Posterior pelvic tilt      ROM / Strength   AROM / PROM / Strength AROM;Strength      AROM   Overall AROM Comments thoarcic and lumbar spine limited by 25% in all directions      Strength   Overall Strength Comments bil hips 4/5 throughout      Flexibility   Soft Tissue Assessment /Muscle Length yes   bil hamstrings and adductors limited by 25%     Palpation   Palpation comment TTP at Rt and middle lower abdomen                        Objective measurements completed on examination: See above findings.     Pelvic Floor Special Questions - 08/07/21 0001     Prior Pelvic/Prostate Exam No   Dr. Carlean Purl recommended going to be evaluated by gynocologist due to abdominal pain per pt and pt is aware.   Are you Pregnant or attempting pregnancy? No    Prior Pregnancies Yes    Number of Pregnancies 3    Number of Vaginal Deliveries 3    Any difficulty with labor and deliveries No    Episiotomy Performed Yes   3   Urinary Leakage Yes    How often all the time per pt    Pad use daily, changing depending on fecal smearing as well but usually one time    Activities that cause leaking With strong urge;Coughing;Laughing;Sneezing;Walking;Exercising;Lifting    Urinary urgency Yes    Urinary frequency usually more than 4 hours but does leak several times per day, 2-3x per night with leakage as well    Fecal incontinence Yes   unaware when it happens, every couple of weeks   Fluid intake 3 bottles of water per day    Caffeine beverages does drink a lot juice, no caffeine    Falling out feeling (prolapse) No    External Perineal Exam deferred    Pelvic Floor Internal  Exam deferred                       PT Education - 08/07/21 1000  Education Details Pt educated on POC, bladder irritants, urge drill, voiding mechanics    Person(s) Educated Patient    Methods Explanation;Demonstration;Tactile cues;Verbal cues;Handout    Comprehension Verbalized understanding;Returned demonstration              PT Short Term Goals - 08/07/21 1019       PT SHORT TERM GOAL #1   Title pt to be I with HEP    Time 6    Period Weeks    Status New    Target Date 09/18/21      PT SHORT TERM GOAL #2   Title pt to demonstrate at least 3/5 pelvic floor strength for improved ability to decrease leakage with and without activity    Time 6    Period Weeks    Status New    Target Date 09/18/21      PT SHORT TERM GOAL #3   Title pt to report no more than 1 fecal leakage per month for improved symptoms severity.    Baseline 1x every week at most currently    Time 6    Period Weeks    Status New    Target Date 09/18/21      PT SHORT TERM GOAL #4   Title pt to report no more than one urinary leak per day for improved symptom severity.    Time 6    Period Weeks    Status New    Target Date 09/18/21               PT Long Term Goals - 08/07/21 1021       PT LONG TERM GOAL #1   Title pt to be I with advanced HEP    Baseline new at eval    Time 4    Period Months    Target Date 12/05/21      PT LONG TERM GOAL #2   Title pt to demonstrate at least 4/5 pelvic floor strength for improved ability to decrease leakage with and without activity    Baseline deferred assessment at eval    Time 4    Period Months    Status New    Target Date 12/05/21      PT LONG TERM GOAL #3   Title pt to report no fecal leakage at least one month for improved symptoms severity.    Baseline at most once per week    Time 4    Period Months    Status New    Target Date 12/05/21      PT LONG TERM GOAL #4   Title pt to report no more than one urinary  leak per week for improved symptom severity.    Baseline several times daily    Time 4    Period Months    Status New    Target Date 12/05/21                    Plan - 08/07/21 1003     Clinical Impression Statement Pt is 70yo female reporting years h/o fecal and urinary incontinence and has been worsening. Pt also has years h/o of Rt and middle/low abdominal pain daily and is scheduling an appointment with gyno to assess this. Pt reports Dr. Carlean Purl assessed her rectally and reports she was weak and referred to PT, pt deferred internal assessment until later treatment session. Pt requested education on things to work on at home prior to returning to see  if this helps first. Pt given and educated on handouts for urge drill, bladder irritants, voiding mechanics, fiber types and abdominal massage. Pt would benefit from additional PT to further  address deficits found during evaluation.    Personal Factors and Comorbidities Age;Time since onset of injury/illness/exacerbation;Comorbidity 3+    Comorbidities Fibromyalgia, Urinary incontinence, Non-bleeding internal hemorrhoids, x3 vaginal births with episiotomies    Examination-Activity Limitations Continence;Hygiene/Grooming    Examination-Participation Restrictions Community Activity;Occupation;Shop;Church    Stability/Clinical Decision Making Evolving/Moderate complexity    Clinical Decision Making Moderate    Rehab Potential Good    PT Frequency 1x / week    PT Duration Other (comment)   10 visits   PT Treatment/Interventions ADLs/Self Care Home Management;Aquatic Therapy;Functional mobility training;Therapeutic activities;Therapeutic exercise;Neuromuscular re-education;Manual techniques;Patient/family education;Passive range of motion;Energy conservation    PT Next Visit Plan go over all handouts, give HEP    Consulted and Agree with Plan of Care Patient             Patient will benefit from skilled therapeutic intervention  in order to improve the following deficits and impairments:  Decreased coordination, Decreased endurance, Impaired tone, Improper body mechanics, Impaired flexibility, Decreased mobility, Decreased strength, Impaired sensation, Postural dysfunction  Visit Diagnosis: Muscle weakness (generalized) - Plan: PT plan of care cert/re-cert  Lack of coordination - Plan: PT plan of care cert/re-cert     Problem List Patient Active Problem List   Diagnosis Date Noted   Resistant hypertension 07/26/2020   Chronic narcotic dependence ([place]) 03/26/2020   Advance directive discussed with patient 03/26/2020   Obesity (BMI 30.0-34.9) 03/23/2019   Mood disorder ([place]) 01/21/2018   Chronic kidney disease, stage III (moderate) ([place]) 01/21/2018   Preventative health care 12/28/2014   Gastroesophageal reflux disease    Personal history of colonic adenomas 10/09/2013   Hyperlipemia 08/10/2007   TENSION HEADACHE 07/06/2007   Essential hypertension, benign 07/06/2007   IRRITABLE BOWEL SYNDROME 07/06/2007   OSTEOARTHRITIS 07/06/2007   Fibromyalgia 07/06/2007   INSOMNIA, HX OF 07/06/2007   Stacy Gardner, PT, DPT 08/07/2209:25 AM   [facility] @ [address] [place], [place], [address] Phone: [phone]   Fax:  [phone]  Name: Sheri [name] MRN: [id number] Date of Birth: 1950-12-11

## 2021-08-07 NOTE — Patient Instructions (Signed)
Bladder Irritants  Certain foods and beverages can be irritating to the bladder.  Avoiding these irritants may decrease your symptoms of urinary urgency, frequency or bladder pain.  Even reducing your intake can help with your symptoms.  Not everyone is sensitive to all bladder irritants, so you may consider focusing on one irritant at a time, removing or reducing your intake of that irritant for 7-10 days to see if this change helps your symptoms.  Water intake is also very important.  Below is a list of bladder irritants.  Drinks: alcohol, carbonated beverages, caffeinated beverages such as coffee and tea, drinks with artificial sweeteners, citrus juices, apple juice, tomato juice  Foods: tomatoes and tomato based foods, spicy food, sugar and artificial sweeteners, vinegar, chocolate, raw onion, apples, citrus fruits, pineapple, cranberries, tomatoes, strawberries, plums, peaches, cantaloupe  Other: acidic urine (too concentrated) - see water intake info below  Substitutes you can try that are NOT irritating to the bladder: cooked onion, pears, papayas, sun-brewed decaf teas, watermelons, non-citrus herbal teas, apricots, kava and low-acid instant drinks (Postum).    WATER INTAKE: Remember to drink lots of water (aim for fluid intake of half your body weight with 2/3 of fluids being water).  You may be limiting fluids due to fear of leakage, but this can actually worsen urgency symptoms due to highly concentrated urine.  Water helps balance the pH of your urine so it doesn't become too acidic - acidic urine is a bladder irritant!  Relaxation Exercises with the Urge to Void   When you experience an urge to void:  FIRST  Stop and stand very still    Sit down if you can    Don't move    You need to stay very still to maintain control  SECOND Squeeze your pelvic floor muscles 5 times, like a quick flick, to keep from leaking  THIRD Relax  Take a deep breath and then let it out  Try to  make the urge go away by using relaxation and visualization techniques  FINALLY When you feel the urge go away somewhat, walk normally to the bathroom.   If the urge gets suddenly stronger on the way, you may stop again and relax to regain control.

## 2021-08-13 ENCOUNTER — Ambulatory Visit
Admission: RE | Admit: 2021-08-13 | Discharge: 2021-08-13 | Disposition: A | Discharge status: Home / Self Care | Payer: BLUE CROSS/BLUE SHIELD | Source: Ambulatory Visit | Attending: Internal Medicine | Admitting: Internal Medicine

## 2021-08-13 DIAGNOSIS — N1831 Chronic kidney disease, stage 3a: Secondary | ICD-10-CM | POA: Diagnosis not present

## 2021-08-18 DIAGNOSIS — N1831 Chronic kidney disease, stage 3a: Secondary | ICD-10-CM | POA: Diagnosis not present

## 2021-08-20 ENCOUNTER — Other Ambulatory Visit: Payer: Self-pay

## 2021-08-20 ENCOUNTER — Ambulatory Visit: Payer: BLUE CROSS/BLUE SHIELD | Admitting: Physical Therapy

## 2021-08-20 DIAGNOSIS — M6281 Muscle weakness (generalized): Secondary | ICD-10-CM | POA: Diagnosis not present

## 2021-08-20 DIAGNOSIS — R279 Unspecified lack of coordination: Secondary | ICD-10-CM

## 2021-08-20 DIAGNOSIS — R159 Full incontinence of feces: Secondary | ICD-10-CM | POA: Diagnosis not present

## 2021-08-20 NOTE — Patient Instructions (Addendum)
Pelvic Floor Strengthening:  Do 3x10 Kegels throughout the day  Do 7O16 Quick Flicks throughout the day (contract and relax quickly back to back) Do 3x10 contractions with a 5 -10 second hold each time *You can put a hand on your abdomen and one on your bottom when doing these. There shouldn't be large movements here when trying to do Kegels. Kegels should be isolated to the pelvic floor *Do one round of 10 at morning, lunch, and evening.

## 2021-08-20 NOTE — Therapy (Signed)
[facility] @ [place], [place], [address] Phone: [phone]   Fax:  [phone]  Physical Therapy Treatment  Patient Details  Name: Sheri [name] MRN: [id number] Date of Birth: 1951/08/24 Referring Provider (PT): Gatha Mayer, MD   Encounter Date: 08/20/2021   PT End of Session - 08/20/21 1058     Visit Number 2    Date for PT Re-Evaluation 12/05/21    Progress Note Due on Visit 10    PT Start Time 1000    PT Stop Time 2423    PT Time Calculation (min) 47 min    Activity Tolerance Patient tolerated treatment well    Behavior During Therapy [facility] for tasks assessed/performed             Past Medical History:  Diagnosis Date   Allergy    GERD (gastroesophageal reflux disease)    Hypertension    IBS (irritable bowel syndrome)    Nocturia    Personal history of colonic adenomas 10/09/2013   Tubular adenoma    Wrist fracture, left 09/21/2012    Past Surgical History:  Procedure Laterality Date   ABDOMINAL HYSTERECTOMY     CARPAL TUNNEL RELEASE  1982   rt   CARPAL TUNNEL RELEASE Right 09/12/2013   Procedure: RIGHT CARPAL TUNNEL RELEASE;  Surgeon: Cammie Sickle., MD;  Location: [facility];  Service: Orthopedics;  Laterality: Right;   COLONOSCOPY  2015, 2018   DORSAL COMPARTMENT RELEASE Left 03/16/2013   Procedure: LEFT FIRST RELEASE DORSAL COMPARTMENT (DEQUERVAIN) EXCISION OF CYST  A1 PULLEY LEFT THUMB;  Surgeon: Cammie Sickle., MD;  Location: [facility];  Service: Orthopedics;  Laterality: Left;   ESOPHAGOGASTRODUODENOSCOPY (EGD) WITH ESOPHAGEAL DILATION  03/2017   ring   TONSILLECTOMY     TUBAL LIGATION      There were no vitals filed for this visit.   Subjective Assessment - 08/20/21 0956     Subjective Pt reports had two fecal incontinence episodes since last session and has been making diet modifications. Pt reports she has had burning sensation at  rectum which happens with leakage of feces. Pt had small amount of blood with wiping early this morning but has not continued after this one time. Pt reports she does continue to have constant urinary leakage as well with all mobility.    Pertinent History Fibromyalgia, Urinary incontinence, Non-bleeding internal hemorrhoids, x3 vaginal births with episiotomies (2 living children), hysterectomy    How long can you sit comfortably? no limits    How long can you stand comfortably? no limits    How long can you walk comfortably? no limits    Patient Stated Goals to have less leakage    Currently in Pain? No/denies                   No emotional/communication barriers or cognitive limitation. Patient is motivated to learn. Patient understands and agrees with treatment goals and plan. PT explains patient will be examined in standing, sitting, and lying down to see how their muscles and joints work. When they are ready, they will be asked to remove their underwear so PT can examine their perineum. The patient is also given the option of providing their own chaperone as one is not provided in our facility. The patient also has the right and is explained the right to defer or refuse any part of the evaluation or treatment including the  internal exam. With the patient's consent, PT will use one gloved finger to gently assess the muscles of the pelvic floor, seeing how well it contracts and relaxes and if there is muscle symmetry. After, the patient will get dressed and PT and patient will discuss exam findings and plan of care. PT and patient discuss plan of care, schedule, attendance policy and HEP activities.          Pelvic Floor Special Questions - 08/20/21 0001     External Perineal Exam WFL    Pelvic Floor Internal Exam patient identified and patient confirms consent for PT to perform internal soft tissue work and muscle strength and integrity assessment    Exam Type Vaginal;Rectal     Sensation decreased internally as pt reports she is unable to feel for contractions but able to feel therapist internally and with palpation    Palpation TTP vaginally at bil ilicoccygeus and obturator internus (5/10 pain)    Strength Flicker   rectally and vaginally. Rectally 0/5 at EAS and 1/5 at puborectalis   Strength # of reps 4    Strength # of seconds 3    Tone decreased               [facility] Adult PT Treatment/Exercise - 08/20/21 0001       Neuro Re-ed    Neuro Re-ed Details  Pt directed in x10 rectal contractions with mild quick release for improved stimulation for activation with fair effect 1/5 strength noted for each contraction at puborectalis, 0/5 at EAS. Vaginally: pt directed in x10 contract/relax with 0/5 strength noted and with quick release and verbal cues for technique pt able to contract 1/5 strength and with 10 reps pt then able to feel she was contracting more and understand technique. Prior pt reported she did not feel she was contracting. Pt also directed in isometrics x5 with pt able to hold contraction for 3s at most.                     PT Education - 08/20/21 1058     Education Details Pt educated on time voiding, kegels for home, voiding and breathing mechanics    Person(s) Educated Patient    Methods Explanation;Demonstration;Tactile cues;Verbal cues;Handout    Comprehension Verbalized understanding;Returned demonstration              PT Short Term Goals - 08/07/21 1019       PT SHORT TERM GOAL #1   Title pt to be I with HEP    Time 6    Period Weeks    Status New    Target Date 09/18/21      PT SHORT TERM GOAL #2   Title pt to demonstrate at least 3/5 pelvic floor strength for improved ability to decrease leakage with and without activity    Time 6    Period Weeks    Status New    Target Date 09/18/21      PT SHORT TERM GOAL #3   Title pt to report no more than 1 fecal leakage per month for improved symptoms severity.     Baseline 1x every week at most currently    Time 6    Period Weeks    Status New    Target Date 09/18/21      PT SHORT TERM GOAL #4   Title pt to report no more than one urinary leak per day for improved symptom severity.  Time 6    Period Weeks    Status New    Target Date 09/18/21               PT Long Term Goals - 08/07/21 1021       PT LONG TERM GOAL #1   Title pt to be I with advanced HEP    Baseline new at eval    Time 4    Period Months    Target Date 12/05/21      PT LONG TERM GOAL #2   Title pt to demonstrate at least 4/5 pelvic floor strength for improved ability to decrease leakage with and without activity    Baseline deferred assessment at eval    Time 4    Period Months    Status New    Target Date 12/05/21      PT LONG TERM GOAL #3   Title pt to report no fecal leakage at least one month for improved symptoms severity.    Baseline at most once per week    Time 4    Period Months    Status New    Target Date 12/05/21      PT LONG TERM GOAL #4   Title pt to report no more than one urinary leak per week for improved symptom severity.    Baseline several times daily    Time 4    Period Months    Status New    Target Date 12/05/21                   Plan - 08/20/21 1058     Clinical Impression Statement Pt presents to clinic reporting no real change in symptoms of fecal or urinary leakage but only had two fecal leaks since last session. Pt consented to internal assessment this date both rectally and vaginally and found to have decreased sensation of contractions, profound weakness throughout pelvic floor. Rectally pt able to contraction at pubrorectalis 1/5 but not EAS 0/5 no pain and demonstrated decrease in endurance and coordination as well. Vaginally pt demonstred 1/5 with inability to tell she was contracting however with reps and cues able to had decreased endurnace and coordination as well. Pt also had pain at bil obturator internus  and ilicoccygeus. Pt educated on pelvic contractions HEP for home and timed voiding, voiding mechanics and breathing mechanics to decrease strain with BMs and urination. Pt tolerated well and verbalized understanding. Pt would benefit from additional PT to further address needs.    Personal Factors and Comorbidities Age;Time since onset of injury/illness/exacerbation;Comorbidity 3+    Comorbidities Fibromyalgia, Urinary incontinence, Non-bleeding internal hemorrhoids, x3 vaginal births with episiotomies    Examination-Activity Limitations Continence;Hygiene/Grooming    Examination-Participation Restrictions Community Activity;Occupation;Shop;Church    Stability/Clinical Decision Making Evolving/Moderate complexity    Rehab Potential Good    PT Frequency 1x / week    PT Duration Other (comment)   10 visits   PT Treatment/Interventions ADLs/Self Care Home Management;Aquatic Therapy;Functional mobility training;Therapeutic activities;Therapeutic exercise;Neuromuscular re-education;Manual techniques;Patient/family education;Passive range of motion;Energy conservation    PT Next Visit Plan go over all handouts, give HEP, bladder retraining    Consulted and Agree with Plan of Care Patient             Patient will benefit from skilled therapeutic intervention in order to improve the following deficits and impairments:  Decreased coordination, Decreased endurance, Impaired tone, Improper body mechanics, Impaired flexibility, Decreased mobility, Decreased strength, Impaired sensation, Postural dysfunction  Visit Diagnosis: Muscle weakness (generalized)  Lack of coordination     Problem List Patient Active Problem List   Diagnosis Date Noted   Resistant hypertension 07/26/2020   Chronic narcotic dependence ([place]) 03/26/2020   Advance directive discussed with patient 03/26/2020   Obesity (BMI 30.0-34.9) 03/23/2019   Mood disorder ([place]) 01/21/2018   Chronic kidney disease, stage III  (moderate) (HCC) 01/21/2018   Preventative health care 12/28/2014   Gastroesophageal reflux disease    Personal history of colonic adenomas 10/09/2013   Hyperlipemia 08/10/2007   TENSION HEADACHE 07/06/2007   Essential hypertension, benign 07/06/2007   IRRITABLE BOWEL SYNDROME 07/06/2007   OSTEOARTHRITIS 07/06/2007   Fibromyalgia 07/06/2007   INSOMNIA, HX OF 07/06/2007    Junie Panning, PT 08/20/2021, 11:03 AM  [facility] @ [place], [place], [address] Phone: [phone]   Fax:  [phone]  Name: Sheri [name] MRN: [id number] Date of Birth: Feb 26, 1951

## 2021-09-01 ENCOUNTER — Other Ambulatory Visit: Payer: Self-pay | Admitting: Internal Medicine

## 2021-09-01 NOTE — Telephone Encounter (Signed)
Last filled 08-03-21 #90 Last OV 07-08-21 Next OV 10-08-21 [facility] [address]

## 2021-09-05 ENCOUNTER — Other Ambulatory Visit: Payer: Self-pay

## 2021-09-05 ENCOUNTER — Ambulatory Visit: Payer: BLUE CROSS/BLUE SHIELD | Attending: Internal Medicine | Admitting: Physical Therapy

## 2021-09-05 DIAGNOSIS — M6281 Muscle weakness (generalized): Secondary | ICD-10-CM

## 2021-09-05 DIAGNOSIS — R279 Unspecified lack of coordination: Secondary | ICD-10-CM

## 2021-09-05 DIAGNOSIS — Z1231 Encounter for screening mammogram for malignant neoplasm of breast: Secondary | ICD-10-CM | POA: Diagnosis not present

## 2021-09-05 LAB — HM MAMMOGRAPHY: HM Mammogram: NORMAL (ref 0–4)

## 2021-09-05 NOTE — Therapy (Signed)
[facility] @ [place], [place], [address] Phone: [phone]   Fax:  [phone]  Physical Therapy Treatment  Patient Details  Name: Sheri [name] MRN: [id number] Date of Birth: [birth year] Referring Provider (PT): Gatha Mayer, MD   Encounter Date: 09/05/2021   PT End of Session - 09/05/21 0837     Visit Number 3    Date for PT Re-Evaluation 12/05/21    Authorization Type medicare/BCBS    Progress Note Due on Visit 10    PT Start Time 0800    PT Stop Time 0840    PT Time Calculation (min) 40 min    Activity Tolerance Patient tolerated treatment well    Behavior During Therapy [facility] for tasks assessed/performed             Past Medical History:  Diagnosis Date   Allergy    GERD (gastroesophageal reflux disease)    Hypertension    IBS (irritable bowel syndrome)    Nocturia    Personal history of colonic adenomas 10/09/2013   Tubular adenoma    Wrist fracture, left 09/21/2012    Past Surgical History:  Procedure Laterality Date   ABDOMINAL HYSTERECTOMY     CARPAL TUNNEL RELEASE  1982   rt   CARPAL TUNNEL RELEASE Right 09/12/2013   Procedure: RIGHT CARPAL TUNNEL RELEASE;  Surgeon: Cammie Sickle., MD;  Location: [facility];  Service: Orthopedics;  Laterality: Right;   COLONOSCOPY  2015, 2018   DORSAL COMPARTMENT RELEASE Left 03/16/2013   Procedure: LEFT FIRST RELEASE DORSAL COMPARTMENT (DEQUERVAIN) EXCISION OF CYST  A1 PULLEY LEFT THUMB;  Surgeon: Cammie Sickle., MD;  Location: [facility];  Service: Orthopedics;  Laterality: Left;   ESOPHAGOGASTRODUODENOSCOPY (EGD) WITH ESOPHAGEAL DILATION  03/2017   ring   TONSILLECTOMY     TUBAL LIGATION      There were no vitals filed for this visit.   Subjective Assessment - 09/05/21 0802     Subjective Pt reports she has had a lot of skin irritation this past week with some fecal leakage and did not have her  supplies at work for skin and cleaning at work. Pt reports after her shift she was much worse and having pain and some bleeding. Pt reports she took a zinc tablet once and this stopped her leakage and her skin has started feeling better. Pt reports timed voids and urge drills has helped her her with urinary incontinence as well.    Pertinent History Fibromyalgia, Urinary incontinence, Non-bleeding internal hemorrhoids, x3 vaginal births with episiotomies (2 living children), hysterectomy    How long can you sit comfortably? no limits    How long can you stand comfortably? no limits    How long can you walk comfortably? no limits    Patient Stated Goals to have less leakage    Currently in Pain? No/denies                               [facility] Adult PT Treatment/Exercise - 09/05/21 0001       Self-Care   Self-Care Other Self-Care Comments    Other Self-Care Comments  Pt educated on urge drill for nighttime urination as well, fluid intake, timed voids, abdominal massage and HEP.      Exercises   Exercises Knee/Hip;Lumbar      Lumbar Exercises: Seated   Sit to  Stand 5 reps    Sit to Stand Limitations with pelvic contractions and breathing mechanics      Manual Therapy   Manual Therapy Soft tissue mobilization    Manual therapy comments abdominal massage x5 rounds with pt in supine and educated on this for home.                     PT Education - 09/05/21 0837     Education Details Pt educated on HEP, urge drill and abdominal massage.    Person(s) Educated Patient    Methods Explanation;Demonstration;Tactile cues;Verbal cues;Handout    Comprehension Verbalized understanding;Returned demonstration              PT Short Term Goals - 08/07/21 1019       PT SHORT TERM GOAL #1   Title pt to be I with HEP    Time 6    Period Weeks    Status New    Target Date 09/18/21      PT SHORT TERM GOAL #2   Title pt to demonstrate at least 3/5 pelvic floor  strength for improved ability to decrease leakage with and without activity    Time 6    Period Weeks    Status New    Target Date 09/18/21      PT SHORT TERM GOAL #3   Title pt to report no more than 1 fecal leakage per month for improved symptoms severity.    Baseline 1x every week at most currently    Time 6    Period Weeks    Status New    Target Date 09/18/21      PT SHORT TERM GOAL #4   Title pt to report no more than one urinary leak per day for improved symptom severity.    Time 6    Period Weeks    Status New    Target Date 09/18/21               PT Long Term Goals - 08/07/21 1021       PT LONG TERM GOAL #1   Title pt to be I with advanced HEP    Baseline new at eval    Time 4    Period Months    Target Date 12/05/21      PT LONG TERM GOAL #2   Title pt to demonstrate at least 4/5 pelvic floor strength for improved ability to decrease leakage with and without activity    Baseline deferred assessment at eval    Time 4    Period Months    Status New    Target Date 12/05/21      PT LONG TERM GOAL #3   Title pt to report no fecal leakage at least one month for improved symptoms severity.    Baseline at most once per week    Time 4    Period Months    Status New    Target Date 12/05/21      PT LONG TERM GOAL #4   Title pt to report no more than one urinary leak per week for improved symptom severity.    Baseline several times daily    Time 4    Period Months    Status New    Target Date 12/05/21                   Plan - 09/05/21 5188     Clinical Impression  Statement Pt presents to clinic with improvement in urinary leakage with less leaks overall and able to withhold urine until she can reach a bathroom more often. Pt reports she does still have some leakage but not as often. Pt has had a bad week with fecal leakage and did not have her hygiene supplies at work and had some smearing and this really irritated her skin and she only had  work Copy paper and this did not help. Pt educated on skin hygiene and technqiues with peri bottle potentially to decrease irritation. Pt also educated on abdominal massage and HEP with pelvic floor strengthening and to continue urge drill with waking during the night to urinate as well. Pt session focused on this educated and completing abdominal massage on herself after PT completed it to insure carry over, and performing HEP exercises for at least one rep to insure understanding. Pt denied additional questions and verbalized understanding at end of session. Pt also reports the squatty potty has been helping at home as well. Pt would benefit from additional PT to further address needs    Personal Factors and Comorbidities Age;Time since onset of injury/illness/exacerbation;Comorbidity 3+    Comorbidities Fibromyalgia, Urinary incontinence, Non-bleeding internal hemorrhoids, x3 vaginal births with episiotomies    Examination-Activity Limitations Continence;Hygiene/Grooming    Examination-Participation Restrictions Community Activity;Occupation;Shop;Church    Stability/Clinical Decision Making Evolving/Moderate complexity    Rehab Potential Good    PT Frequency 1x / week    PT Duration Other (comment)   10 visits   PT Treatment/Interventions ADLs/Self Care Home Management;Aquatic Therapy;Functional mobility training;Therapeutic activities;Therapeutic exercise;Neuromuscular re-education;Manual techniques;Patient/family education;Passive range of motion;Energy conservation    PT Next Visit Plan bladder retraining and pelvic strengthening with activity    PT Home Exercise Plan [email or web address]    Consulted and Agree with Plan of Care Patient             Patient will benefit from skilled therapeutic intervention in order to improve the following deficits and impairments:  Decreased coordination, Decreased endurance, Impaired tone, Improper body mechanics, Impaired flexibility, Decreased mobility, Decreased  strength, Impaired sensation, Postural dysfunction  Visit Diagnosis: Muscle weakness (generalized)  Lack of coordination     Problem List Patient Active Problem List   Diagnosis Date Noted   Resistant hypertension 07/26/2020   Chronic narcotic dependence ([place]) 03/26/2020   Advance directive discussed with patient 03/26/2020   Obesity (BMI 30.0-34.9) 03/23/2019   Mood disorder ([place]) 01/21/2018   Chronic kidney disease, stage III (moderate) (HCC) 01/21/2018   Preventative health care 12/28/2014   Gastroesophageal reflux disease    Personal history of colonic adenomas 10/09/2013   Hyperlipemia 08/10/2007   TENSION HEADACHE 07/06/2007   Essential hypertension, benign 07/06/2007   IRRITABLE BOWEL SYNDROME 07/06/2007   OSTEOARTHRITIS 07/06/2007   Fibromyalgia 07/06/2007   INSOMNIA, HX OF 07/06/2007   Stacy Gardner, PT, DPT 12/16/228:46 AM   [facility] @ [place], [place], [address] Phone: [phone]   Fax:  [phone]  Name: Charman Blasco MRN: [id number] Date of Birth: [birth year]

## 2021-09-10 DIAGNOSIS — H40013 Open angle with borderline findings, low risk, bilateral: Secondary | ICD-10-CM | POA: Diagnosis not present

## 2021-09-16 ENCOUNTER — Other Ambulatory Visit: Payer: Self-pay | Admitting: Internal Medicine

## 2021-09-16 NOTE — Telephone Encounter (Signed)
Last filled 10/28/-22 #30 1 refill Last OV 07-08-21 Next OV 10-08-21

## 2021-09-25 ENCOUNTER — Encounter: Payer: Medicare Other | Admitting: Physical Therapy

## 2021-10-02 ENCOUNTER — Other Ambulatory Visit: Payer: Self-pay | Admitting: Internal Medicine

## 2021-10-02 NOTE — Telephone Encounter (Signed)
Last filled 09-01-21 #90 Last OV 07-08-21 Next OV 10-08-21 [facility] [address]

## 2021-10-07 ENCOUNTER — Other Ambulatory Visit: Payer: Self-pay

## 2021-10-07 ENCOUNTER — Ambulatory Visit: Payer: BLUE CROSS/BLUE SHIELD | Attending: Internal Medicine | Admitting: Physical Therapy

## 2021-10-07 ENCOUNTER — Encounter: Payer: Self-pay | Admitting: Physical Therapy

## 2021-10-07 DIAGNOSIS — M6281 Muscle weakness (generalized): Secondary | ICD-10-CM | POA: Diagnosis not present

## 2021-10-07 DIAGNOSIS — R279 Unspecified lack of coordination: Secondary | ICD-10-CM | POA: Diagnosis not present

## 2021-10-07 NOTE — Therapy (Signed)
[facility] @ [facility] [place], [place], [address] Phone: [phone]   Fax:  [phone]  Physical Therapy Treatment  Patient Details  Name: Sheri [name] MRN: [id number] Date of Birth: October 20, 1950 Referring Provider (PT): Gatha Mayer, MD   Encounter Date: 10/07/2021   PT End of Session - 10/07/21 0839     Visit Number 4    Date for PT Re-Evaluation 12/05/21    Authorization Type medicare/BCBS    Progress Note Due on Visit 10    PT Start Time 0802    PT Stop Time 0841    PT Time Calculation (min) 39 min    Activity Tolerance Patient tolerated treatment well    Behavior During Therapy [facility] for tasks assessed/performed             Past Medical History:  Diagnosis Date   Allergy    GERD (gastroesophageal reflux disease)    Hypertension    IBS (irritable bowel syndrome)    Nocturia    Personal history of colonic adenomas 10/09/2013   Tubular adenoma    Wrist fracture, left 09/21/2012    Past Surgical History:  Procedure Laterality Date   ABDOMINAL HYSTERECTOMY     CARPAL TUNNEL RELEASE  1982   rt   CARPAL TUNNEL RELEASE Right 09/12/2013   Procedure: RIGHT CARPAL TUNNEL RELEASE;  Surgeon: Cammie Sickle., MD;  Location: [facility];  Service: Orthopedics;  Laterality: Right;   COLONOSCOPY  2015, 2018   DORSAL COMPARTMENT RELEASE Left 03/16/2013   Procedure: LEFT FIRST RELEASE DORSAL COMPARTMENT (DEQUERVAIN) EXCISION OF CYST  A1 PULLEY LEFT THUMB;  Surgeon: Cammie Sickle., MD;  Location: [facility];  Service: Orthopedics;  Laterality: Left;   ESOPHAGOGASTRODUODENOSCOPY (EGD) WITH ESOPHAGEAL DILATION  03/2017   ring   TONSILLECTOMY     TUBAL LIGATION      There were no vitals filed for this visit.   Subjective Assessment - 10/07/21 0804     Subjective Pt reports she is urinating more regularly now and leaking less often and less amounts and pt pleased with this.  Pt reports she wasn't seeing a difference in fecal leakage with use of benefiber but is reports she is now trying metamucil gummies and thinks these have really helped. Pt is now only seeing fecal smearing with wiping and not in pads which has helped her skin a lot. Pt does have skin irritation still but "nothing like it was". Pt reports she is waking up usually 3x per night but she does work nights sometimes and thinks this makes this worse.    Pertinent History Fibromyalgia, Urinary incontinence, Non-bleeding internal hemorrhoids, x3 vaginal births with episiotomies (2 living children), hysterectomy    How long can you sit comfortably? no limits    How long can you stand comfortably? no limits    How long can you walk comfortably? no limits    Currently in Pain? No/denies                            Pelvic Floor Special Questions - 10/07/21 0001     Pelvic Floor Internal Exam deferred as pt reports she has better understanding of contractions and relaxation and wasn't before.               [facility] Adult PT Treatment/Exercise - 10/07/21 0001       Exercises  Exercises Lumbar;Knee/Hip      Lumbar Exercises: Standing   Functional Squats 10 reps    Functional Squats Limitations teal bolster from ground for improved lifting lifting mechanics and coordinated breathing and pelvic floor activation for decreased leakage with lifting for work.    Other Standing Lumbar Exercises palloffs refs band x10 each; rotation palloffs red band x10each      Lumbar Exercises: Seated   Sit to Stand 10 reps    Sit to Stand Limitations with pelvic contractions and breathing    Other Seated Lumbar Exercises shoulder horizontal abduction with breathing and core activations x10                     PT Education - 10/07/21 0838     Education Details Pt educated on continued HEP, breathing mechanics and pelvic floor coordination with all exercises.    Person(s) Educated Patient     Methods Explanation;Demonstration;Tactile cues;Verbal cues    Comprehension Returned demonstration;Verbalized understanding              PT Short Term Goals - 08/07/21 1019       PT SHORT TERM GOAL #1   Title pt to be I with HEP    Time 6    Period Weeks    Status New    Target Date 09/18/21      PT SHORT TERM GOAL #2   Title pt to demonstrate at least 3/5 pelvic floor strength for improved ability to decrease leakage with and without activity    Time 6    Period Weeks    Status New    Target Date 09/18/21      PT SHORT TERM GOAL #3   Title pt to report no more than 1 fecal leakage per month for improved symptoms severity.    Baseline 1x every week at most currently    Time 6    Period Weeks    Status New    Target Date 09/18/21      PT SHORT TERM GOAL #4   Title pt to report no more than one urinary leak per day for improved symptom severity.    Time 6    Period Weeks    Status New    Target Date 09/18/21               PT Long Term Goals - 08/07/21 1021       PT LONG TERM GOAL #1   Title pt to be I with advanced HEP    Baseline new at eval    Time 4    Period Months    Target Date 12/05/21      PT LONG TERM GOAL #2   Title pt to demonstrate at least 4/5 pelvic floor strength for improved ability to decrease leakage with and without activity    Baseline deferred assessment at eval    Time 4    Period Months    Status New    Target Date 12/05/21      PT LONG TERM GOAL #3   Title pt to report no fecal leakage at least one month for improved symptoms severity.    Baseline at most once per week    Time 4    Period Months    Status New    Target Date 12/05/21      PT LONG TERM GOAL #4   Title pt to report no more than one urinary leak per week  for improved symptom severity.    Baseline several times daily    Time 4    Period Months    Status New    Target Date 12/05/21                   Plan - 10/07/21 0839     Clinical  Impression Statement Pt presents to clinic reporting she is pleased with current improvement with PT so far: more regulare bladder voids, less leakage in amount of urine and frequency of leakage, less fecal smearing in amount, improved skin intregrity, now able to feel when she is contracting and relaxing pelvic floor, and abdominal massage and step stool is helping fully empty bowels. Pt reports "it's nothing like it was" in reference to overall symptoms and pleased with progress so far. Pt session focused on breathing and pelvic floor coordination with all exercises for hip and core strengthening. Pt tolerated well with minimal cues.  Pt would benefit from additional PT to further address needs    Personal Factors and Comorbidities Age;Time since onset of injury/illness/exacerbation;Comorbidity 3+    Comorbidities Fibromyalgia, Urinary incontinence, Non-bleeding internal hemorrhoids, x3 vaginal births with episiotomies    Examination-Activity Limitations Continence;Hygiene/Grooming    Examination-Participation Restrictions Community Activity;Occupation;Shop;Church    Stability/Clinical Decision Making Evolving/Moderate complexity    Rehab Potential Good    PT Frequency 1x / week    PT Duration Other (comment)   10 visits   PT Treatment/Interventions ADLs/Self Care Home Management;Aquatic Therapy;Functional mobility training;Therapeutic activities;Therapeutic exercise;Neuromuscular re-education;Manual techniques;Patient/family education;Passive range of motion;Energy conservation    PT Next Visit Plan pelvic strengthening with activity    PT Home Exercise Plan [id number]    Consulted and Agree with Plan of Care Patient             Patient will benefit from skilled therapeutic intervention in order to improve the following deficits and impairments:  Decreased coordination, Decreased endurance, Impaired tone, Improper body mechanics, Impaired flexibility, Decreased mobility, Decreased strength,  Impaired sensation, Postural dysfunction  Visit Diagnosis: Muscle weakness (generalized)  Lack of coordination     Problem List Patient Active Problem List   Diagnosis Date Noted   Resistant hypertension 07/26/2020   Chronic narcotic dependence ([place]) 03/26/2020   Advance directive discussed with patient 03/26/2020   Obesity (BMI 30.0-34.9) 03/23/2019   Mood disorder ([place]) 01/21/2018   Chronic kidney disease, stage III (moderate) (HCC) 01/21/2018   Preventative health care 12/28/2014   Gastroesophageal reflux disease    Personal history of colonic adenomas 10/09/2013   Hyperlipemia 08/10/2007   TENSION HEADACHE 07/06/2007   Essential hypertension, benign 07/06/2007   IRRITABLE BOWEL SYNDROME 07/06/2007   OSTEOARTHRITIS 07/06/2007   Fibromyalgia 07/06/2007   INSOMNIA, HX OF 07/06/2007    Stacy Gardner, PT, DPT 01/17/238:46 AM   [facility] @ [address] [place], [place], [address] Phone: [phone]   Fax:  [phone]  Name: Sheri [name] MRN: [id number] Date of Birth: [birth year]

## 2021-10-08 ENCOUNTER — Ambulatory Visit (INDEPENDENT_AMBULATORY_CARE_PROVIDER_SITE_OTHER): Payer: BLUE CROSS/BLUE SHIELD | Admitting: Internal Medicine

## 2021-10-08 ENCOUNTER — Encounter: Payer: Self-pay | Admitting: Internal Medicine

## 2021-10-08 ENCOUNTER — Other Ambulatory Visit: Payer: Self-pay

## 2021-10-08 DIAGNOSIS — I10 Essential (primary) hypertension: Secondary | ICD-10-CM | POA: Diagnosis not present

## 2021-10-08 DIAGNOSIS — N1832 Chronic kidney disease, stage 3b: Secondary | ICD-10-CM | POA: Diagnosis not present

## 2021-10-08 NOTE — Progress Notes (Signed)
Subjective:    Patient ID: Sheri [name], female    DOB: Sep 08, 1951, 71 y.o.   MRN: [id number]  HPI Here for follow up of resistant HTN  Feeling exhausted Switched to third shift --about 3 months (not your choice) Having trouble sleeping  Did go back to nephrology--Dr Peeples BP only 138/78 then Has done more testing ---repeat ultrasound, renal artery testing (borderline)  No chest pain No SOB Occasional mild dizziness--if she gets up too fast  Current Outpatient Medications on File Prior to Visit  Medication Sig Dispense Refill   amLODipine (NORVASC) 5 MG tablet Take 1 tablet (5 mg total) by mouth daily. 90 tablet 3   aspirin 81 MG tablet Take 81 mg by mouth daily.     BIOTIN PO Take by mouth.     chlorthalidone (HYGROTON) 25 MG tablet Take 1 tablet (25 mg total) by mouth daily. 90 tablet 3   Cyanocobalamin (B-12 PO) Take by mouth.     cyclobenzaprine (FLEXERIL) 10 MG tablet TAKE 1/2 TO 1 TABLET(5 TO 10 MG) BY MOUTH AT BEDTIME AS NEEDED FOR MUSCLE SPASMS 30 tablet 1   GARLIC PO Take by mouth.     Multiple Vitamins-Minerals (ZINC PO) Take by mouth.     olmesartan (BENICAR) 40 MG tablet Take 1 tablet (40 mg total) by mouth daily. 90 tablet 3   omeprazole (PRILOSEC) 20 MG capsule Take 1 capsule (20 mg total) by mouth daily. 90 capsule 3   psyllium (METAMUCIL) 58.6 % powder Take 1 packet by mouth daily.     traMADol (ULTRAM) 50 MG tablet TAKE 1 TABLET(50 MG) BY MOUTH THREE TIMES DAILY AS NEEDED 90 tablet 0   Vitamin D, Cholecalciferol, 10 MCG (400 UNIT) TABS Take by mouth.     OVER THE COUNTER MEDICATION Super Beets Heart Chews (Patient not taking: Reported on 10/08/2021)     No current facility-administered medications on file prior to visit.    Allergies  Allergen Reactions   Codeine Nausea And Vomiting   Statins Other (See Comments)    Caused thinning hair/hair to fall out    Past Medical History:  Diagnosis Date   Allergy    GERD (gastroesophageal reflux disease)     Hypertension    IBS (irritable bowel syndrome)    Nocturia    Personal history of colonic adenomas 10/09/2013   Tubular adenoma    Wrist fracture, left 09/21/2012    Past Surgical History:  Procedure Laterality Date   ABDOMINAL HYSTERECTOMY     CARPAL TUNNEL RELEASE  1982   rt   CARPAL TUNNEL RELEASE Right 09/12/2013   Procedure: RIGHT CARPAL TUNNEL RELEASE;  Surgeon: Cammie Sickle., MD;  Location: [facility];  Service: Orthopedics;  Laterality: Right;   COLONOSCOPY  2015, 2018   DORSAL COMPARTMENT RELEASE Left 03/16/2013   Procedure: LEFT FIRST RELEASE DORSAL COMPARTMENT (DEQUERVAIN) EXCISION OF CYST  A1 PULLEY LEFT THUMB;  Surgeon: Cammie Sickle., MD;  Location: [facility];  Service: Orthopedics;  Laterality: Left;   ESOPHAGOGASTRODUODENOSCOPY (EGD) WITH ESOPHAGEAL DILATION  03/2017   ring   TONSILLECTOMY     TUBAL LIGATION      Family History  Problem Relation Age of Onset   Dementia Mother        Lewy body   Hypertension Sister    Cancer Sister        breast   Colon cancer Neg Hx    Esophageal cancer Neg Hx  Rectal cancer Neg Hx    Stomach cancer Neg Hx    Heart disease Neg Hx     Social History   Socioeconomic History   Marital status: Divorced    Spouse name: Not on file   Number of children: 2   Years of education: 13   Highest education level: Not on file  Occupational History   Occupation: [employer]    Comment: Medi  Tobacco Use   Smoking status: Former    Types: Cigarettes    Quit date: 10/13/2002    Years since quitting: 19.0   Smokeless tobacco: Never  Vaping Use   Vaping Use: Never used  Substance and Sexual Activity   Alcohol use: No   Drug use: No   Sexual activity: Never  Other Topics Concern   Not on file  Social History Narrative   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 Married '73 - 18 yr/divorced. 1 dtr - '82, 1 son - '73; 1 grandchild.    Lives in her own place            No formal living will    Son, then daughter, should make health care POA   Would accept resuscitation   Not sure about tube feeds--but wouldn't want if cognitively unaware   Social Determinants of Health   Financial Resource Strain: Not on file  Food Insecurity: Not on file  Transportation Needs: Not on file  Physical Activity: Not on file  Stress: Not on file  Social Connections: Not on file  Intimate Partner Violence: Not on file   Review of Systems Appetite off with schedule change---has lost over 10# Doing therapy for fecal incontinence----better now (and burning is mostly gone)    Objective:   Physical Exam Constitutional:      Appearance: Normal appearance.  Cardiovascular:     Rate and Rhythm: Normal rate and regular rhythm.     Heart sounds: No murmur heard.   No gallop.  Pulmonary:     Effort: Pulmonary effort is normal.     Breath sounds: Normal breath sounds. No wheezing or rales.  Musculoskeletal:     Cervical back: Neck supple.     Right lower leg: No edema.     Left lower leg: No edema.  Lymphadenopathy:     Cervical: No cervical adenopathy.  Neurological:     Mental Status: She is alert.           Assessment & Plan:

## 2021-10-08 NOTE — Assessment & Plan Note (Signed)
BP Readings from Last 3 Encounters:  10/08/21 138/70  07/16/21 140/80  07/08/21 (!) 160/78   Does seem better here (after sitting for a while) and at nephrologist On amlodipine 5, chlorthalidone 25, olmesartan 40

## 2021-10-08 NOTE — Assessment & Plan Note (Signed)
GFR down a bit more with nephrologist, Dr Joylene Grapes, but he is managing

## 2021-10-21 ENCOUNTER — Ambulatory Visit: Payer: BLUE CROSS/BLUE SHIELD | Admitting: Physical Therapy

## 2021-10-21 ENCOUNTER — Other Ambulatory Visit: Payer: Self-pay

## 2021-10-21 ENCOUNTER — Encounter: Payer: Self-pay | Admitting: Physical Therapy

## 2021-10-21 DIAGNOSIS — R279 Unspecified lack of coordination: Secondary | ICD-10-CM | POA: Diagnosis not present

## 2021-10-21 DIAGNOSIS — M6281 Muscle weakness (generalized): Secondary | ICD-10-CM

## 2021-10-21 NOTE — Therapy (Signed)
[facility] @ [address] [place], [place], [address] Phone: [phone]   Fax:  [phone]  Physical Therapy Treatment  Patient Details  Name: Sheri [name] MRN: [id number] Date of Birth: 04/10/1951 Referring Provider (PT): Gatha Mayer, MD   Encounter Date: 10/21/2021   PT End of Session - 10/21/21 0834     Visit Number 5    Date for PT Re-Evaluation 12/05/21    Authorization Type medicare/BCBS    Progress Note Due on Visit 10    PT Start Time 0800    PT Stop Time 0831   pt denied additional needs and requested to end early to return home after coming her from night shift.   PT Time Calculation (min) 31 min    Activity Tolerance Patient tolerated treatment well    Behavior During Therapy WFL for tasks assessed/performed             Past Medical History:  Diagnosis Date   Allergy    GERD (gastroesophageal reflux disease)    Hypertension    IBS (irritable bowel syndrome)    Nocturia    Personal history of colonic adenomas 10/09/2013   Tubular adenoma    Wrist fracture, left 09/21/2012    Past Surgical History:  Procedure Laterality Date   ABDOMINAL HYSTERECTOMY     CARPAL TUNNEL RELEASE  1982   rt   CARPAL TUNNEL RELEASE Right 09/12/2013   Procedure: RIGHT CARPAL TUNNEL RELEASE;  Surgeon: Cammie Sickle., MD;  Location: [facility];  Service: Orthopedics;  Laterality: Right;   COLONOSCOPY  2015, 2018   DORSAL COMPARTMENT RELEASE Left 03/16/2013   Procedure: LEFT FIRST RELEASE DORSAL COMPARTMENT (DEQUERVAIN) EXCISION OF CYST  A1 PULLEY LEFT THUMB;  Surgeon: Cammie Sickle., MD;  Location: [facility];  Service: Orthopedics;  Laterality: Left;   ESOPHAGOGASTRODUODENOSCOPY (EGD) WITH ESOPHAGEAL DILATION  03/2017   ring   TONSILLECTOMY     TUBAL LIGATION      There were no vitals filed for this visit.   Subjective Assessment - 10/21/21 0803     Subjective Pt  reports she very pleased with regularity of urination now and now going every 3-4 hours; less leakage "definitely, nowhere like it was. This has definitely improved." Pt reports overall less leakage and less often now only seeing small leakage with strong sneeze occasionally. Pt reports she no longer seeing any smearing in underwear/pads and no accidents. Pt reports her GYN recommended Metamucil and now that she is taking this she feels like this has been so helpful. Pt report no longer having burning at rectum and notices her skin is much healthier.    Pertinent History Fibromyalgia, Urinary incontinence, Non-bleeding internal hemorrhoids, x3 vaginal births with episiotomies (2 living children), hysterectomy    How long can you sit comfortably? no limits    How long can you stand comfortably? no limits    How long can you walk comfortably? no limits    Patient Stated Goals to have less leakage    Currently in Pain? No/denies                            Pelvic Floor Special Questions - 10/21/21 0001     Pelvic Floor Internal Exam deferred as pt reports she has a good understanding of the contractions/relaxation of muscles  [facility] Adult PT Treatment/Exercise - 10/21/21 0001       Self-Care   Self-Care Other Self-Care Comments    Other Self-Care Comments  Pt educated on discharge recommendations of continued HEP, went over bladder and bowel habits with timing, continuing trainings discussed throughout POC. Pt denied additional questions.      Lumbar Exercises: Seated   Other Seated Lumbar Exercises x10 pelvic floor contractions/relax; V37 quick flicks; and x3 10G hold at [place]                     PT Education - 10/21/21 0834     Education Details Pt educated on continued HEP, maintaining recommendations and if needed will need a new referral from MD to return for future PT needs.    Person(s) Educated Patient    Methods  Explanation;Demonstration;Tactile cues;Verbal cues    Comprehension Returned demonstration;Verbalized understanding              PT Short Term Goals - 10/21/21 0816       PT SHORT TERM GOAL #1   Title pt to be I with HEP    Time 6    Period Weeks    Status Achieved    Target Date 09/18/21      PT SHORT TERM GOAL #2   Title pt to demonstrate at least 3/5 pelvic floor strength for improved ability to decrease leakage with and without activity    Time 6    Period Weeks    Status Unable to assess   pt denied internal reassess this date   Target Date 09/18/21      PT SHORT TERM GOAL #3   Title pt to report no more than 1 fecal leakage per month for improved symptoms severity.    Baseline 1x every week at most currently    Time 6    Period Weeks    Status Achieved    Target Date 09/18/21      PT SHORT TERM GOAL #4   Title pt to report no more than one urinary leak per day for improved symptom severity.    Time 6    Period Weeks    Status New    Target Date 09/18/21               PT Long Term Goals - 10/21/21 0817       PT LONG TERM GOAL #1   Title pt to be I with advanced HEP    Baseline new at eval    Time 4    Period Months    Status Achieved    Target Date 12/05/21      PT LONG TERM GOAL #2   Title pt to demonstrate at least 4/5 pelvic floor strength for improved ability to decrease leakage with and without activity    Baseline deferred assessment at eval    Time 4    Period Months    Status Unable to assess   pt denied internal reassessment this date   Target Date 12/05/21      PT LONG TERM GOAL #3   Title pt to report no fecal leakage at least one month for improved symptoms severity.    Baseline at most once per week    Time 4    Period Months    Status Achieved    Target Date 12/05/21      PT LONG TERM GOAL #4   Title pt to report no more  than one urinary leak per week for improved symptom severity.    Baseline several times daily    Time  4    Period Months    Status Achieved    Target Date 12/05/21                   Plan - 10/21/21 0835     Clinical Impression Statement Pt presents to clinic reporting she is very pleased with progress with PT, has met all STG and LTGs after discussing with Pt about her bowel and bladder habits. Pt reports she is no longer having urinary leakage and if she does it is a very small amount with a strong sneeze and urinating much more regularly with normal urge and ability to hold to make with urge (3-4 hours betwen voids now). Pt also reports she has not had fecal smearing or leakage in a least a month and noted a decline prior to this, did have one small smear post taking pepto bismol but nothing other than this and notices greatly improved skin integrity at rectum. Pt denied internal pelvic reassessment due to her improvement with symptoms therefore this goal unable to be formally reassessed however improvement in symptoms and pt's ability to complete HEP well and can feel contraction indicated improvement in muscle activation. Pt session focused on a round of pelvic contractions to insure carry over and that pt didn't have questions about these and education on maintaining recommendations and HEP and if needed to gain a new medical referral for any future PT needs. Pt agreed and reports she is happy with progress and understands all recommndations and agreeable to DC today.    Personal Factors and Comorbidities Age;Time since onset of injury/illness/exacerbation;Comorbidity 3+    Comorbidities Fibromyalgia, Urinary incontinence, Non-bleeding internal hemorrhoids, x3 vaginal births with episiotomies    Examination-Activity Limitations Continence;Hygiene/Grooming    Examination-Participation Restrictions Community Activity;Occupation;Shop;Church    Stability/Clinical Decision Making Evolving/Moderate complexity    Rehab Potential Good    PT Frequency 1x / week    PT Duration Other (comment)    10 visits   PT Treatment/Interventions ADLs/Self Care Home Management;Aquatic Therapy;Functional mobility training;Therapeutic activities;Therapeutic exercise;Neuromuscular re-education;Manual techniques;Patient/family education;Passive range of motion;Energy conservation    PT Home Exercise Plan [id number]    Consulted and Agree with Plan of Care Patient             Patient will benefit from skilled therapeutic intervention in order to improve the following deficits and impairments:  Decreased coordination, Decreased endurance, Impaired tone, Improper body mechanics, Impaired flexibility, Decreased mobility, Decreased strength, Impaired sensation, Postural dysfunction  Visit Diagnosis: Muscle weakness (generalized)     Problem List Patient Active Problem List   Diagnosis Date Noted   Resistant hypertension 07/26/2020   Chronic narcotic dependence ([place]) 03/26/2020   Advance directive discussed with patient 03/26/2020   Obesity (BMI 30.0-34.9) 03/23/2019   Mood disorder ([place]) 01/21/2018   Chronic kidney disease, stage III (moderate) (HCC) 01/21/2018   Preventative health care 12/28/2014   Gastroesophageal reflux disease    Personal history of colonic adenomas 10/09/2013   Hyperlipemia 08/10/2007   TENSION HEADACHE 07/06/2007   Essential hypertension, benign 07/06/2007   IRRITABLE BOWEL SYNDROME 07/06/2007   OSTEOARTHRITIS 07/06/2007   Fibromyalgia 07/06/2007   INSOMNIA, HX OF 07/06/2007    PHYSICAL THERAPY DISCHARGE SUMMARY  Visits from Start of Care: 5  Current functional level related to goals / functional outcomes: All goals met   Remaining deficits: All goals met and  pt pleased with progress   Education / Equipment: HEP   Patient agrees to discharge. Patient goals were met. Patient is being discharged due to meeting the stated rehab goals. Thank you for the referral.    Stacy Gardner, PT, DPT 01/31/238:40 AM   [facility] @ [address] [place], [place], [address] Phone: [phone]   Fax:  [phone]  Name: Sheri [name] MRN: [id number] Date of Birth: 1950-10-09

## 2021-10-31 ENCOUNTER — Other Ambulatory Visit: Payer: Self-pay | Admitting: Internal Medicine

## 2021-10-31 NOTE — Telephone Encounter (Signed)
Last filled 10-08-21 #90 Last OV 10-08-21 No Future OV [facility] [address]

## 2021-11-03 NOTE — Telephone Encounter (Signed)
It was done the 12th, not the 18th,.  Rx done Tell her sorry for any inconvenience

## 2021-11-03 NOTE — Telephone Encounter (Signed)
Pt called to follow up on refill, she said that her bottle has a fill date of 10/02/21

## 2021-12-04 ENCOUNTER — Other Ambulatory Visit: Payer: Self-pay | Admitting: Internal Medicine

## 2021-12-15 ENCOUNTER — Other Ambulatory Visit: Payer: Self-pay | Admitting: Internal Medicine

## 2021-12-24 DIAGNOSIS — N1832 Chronic kidney disease, stage 3b: Secondary | ICD-10-CM | POA: Diagnosis not present

## 2021-12-24 DIAGNOSIS — I10 Essential (primary) hypertension: Secondary | ICD-10-CM | POA: Diagnosis not present

## 2021-12-24 DIAGNOSIS — R809 Proteinuria, unspecified: Secondary | ICD-10-CM | POA: Diagnosis not present

## 2021-12-24 DIAGNOSIS — K219 Gastro-esophageal reflux disease without esophagitis: Secondary | ICD-10-CM | POA: Diagnosis not present

## 2021-12-24 DIAGNOSIS — N1831 Chronic kidney disease, stage 3a: Secondary | ICD-10-CM | POA: Diagnosis not present

## 2021-12-29 LAB — MICROALBUMIN / CREATININE URINE RATIO: Microalb Creat Ratio: 61

## 2021-12-29 LAB — BASIC METABOLIC PANEL: Creatinine: 1.1 (ref <=1.1)

## 2021-12-29 LAB — COMPREHENSIVE METABOLIC PANEL: Albumin: 46.9 — AB (ref 3.5–5.0)

## 2022-01-09 ENCOUNTER — Telehealth: Payer: Self-pay

## 2022-01-09 ENCOUNTER — Other Ambulatory Visit: Payer: Self-pay | Admitting: Internal Medicine

## 2022-01-09 NOTE — Telephone Encounter (Signed)
[facility] - Client ?Nonclinical Telephone Record  ?[facility]? ?Client [facility] - Client ?Client Site [place] ?Provider Viviana Simpler- MD ?Contact Type Call ?Who Is Calling Patient / Member / Family / Caregiver ?Caller Name Neda Willenbring ?Caller Phone Number [phone] ?Patient Name Sheri [name] ?Patient DOB Feb 07, 1951 ?Call Type Message Only Information Provided ?Reason for Call Medication Question / Request ?Initial Comment Caller states they are calling regarding Tramadol. His script went from 30 days to 7 days ?Additional Comment Provided office hours ?Disp. Time Disposition Final User ?01/09/2022 8:09:15 AM General Information Provided Yes Wynetta Emery, [place] ?Call Closed By: Archie Balboa ?Transaction Date/Time: 01/09/2022 8:05:11 AM (ET ?

## 2022-01-09 NOTE — Telephone Encounter (Signed)
I spoke with pt and she said last couple of refills were for 7 days only and pt thinks has to do with ins but pt said has told pharmacy not to runt thru ins and to use good rx. Pt said [facility]. Last refilled # 90 on 12/04/21. Pt said if needs anything else pt will cb. ?

## 2022-01-09 NOTE — Telephone Encounter (Signed)
Last filled 12-04-21 #90 ?Last OV 10-08-21 ?No Future OV ?[place] ?

## 2022-01-09 NOTE — Telephone Encounter (Signed)
Pt thought she missed in coming call; I did not call pt back but I saw where pharmacy had requested refill on tramadol and Dr Silvio Pate will be back in office on 01/12/22. Pt voiced understanding and nothing further needed. ?

## 2022-01-15 DIAGNOSIS — N1832 Chronic kidney disease, stage 3b: Secondary | ICD-10-CM | POA: Diagnosis not present

## 2022-02-03 DIAGNOSIS — I10 Essential (primary) hypertension: Secondary | ICD-10-CM | POA: Diagnosis not present

## 2022-02-03 DIAGNOSIS — G4700 Insomnia, unspecified: Secondary | ICD-10-CM | POA: Diagnosis not present

## 2022-02-03 DIAGNOSIS — K589 Irritable bowel syndrome without diarrhea: Secondary | ICD-10-CM | POA: Diagnosis not present

## 2022-02-03 DIAGNOSIS — N1832 Chronic kidney disease, stage 3b: Secondary | ICD-10-CM | POA: Diagnosis not present

## 2022-02-10 ENCOUNTER — Other Ambulatory Visit: Payer: Self-pay | Admitting: Internal Medicine

## 2022-02-10 NOTE — Telephone Encounter (Signed)
Last filled 01-09-22 #90 Last OV 10-08-21 No Future OV [facility] [address]

## 2022-02-13 ENCOUNTER — Other Ambulatory Visit: Payer: Self-pay | Admitting: Internal Medicine

## 2022-02-13 NOTE — Telephone Encounter (Signed)
Last filled 01-14-22 #30 Last OV 10-08-21 No Future OV [facility] [address] [address]

## 2022-03-12 DIAGNOSIS — H40013 Open angle with borderline findings, low risk, bilateral: Secondary | ICD-10-CM | POA: Diagnosis not present

## 2022-03-13 ENCOUNTER — Other Ambulatory Visit: Payer: Self-pay | Admitting: Internal Medicine

## 2022-04-17 ENCOUNTER — Other Ambulatory Visit: Payer: Self-pay | Admitting: Internal Medicine

## 2022-04-17 NOTE — Telephone Encounter (Signed)
Last filled 03-13-22 #90 Last OV 10-08-21 No Future OV [facility] [address] [address]

## 2022-04-27 DIAGNOSIS — N1832 Chronic kidney disease, stage 3b: Secondary | ICD-10-CM | POA: Diagnosis not present

## 2022-05-01 IMAGING — US US RENAL ARTERY STENOSIS
1 series · 13 of 25 positions shown · non-contrast
Comparison: 11/18/2020 and previous

CLINICAL DATA: Chronic kidney disease stage IIIA

EXAM:
RENAL/URINARY TRACT ULTRASOUND
RENAL DUPLEX DOPPLER ULTRASOUND

[Series 1: us renal artery stenosis · 0.22mm/px · 13 of 89 slices shown]
[im 1/89]
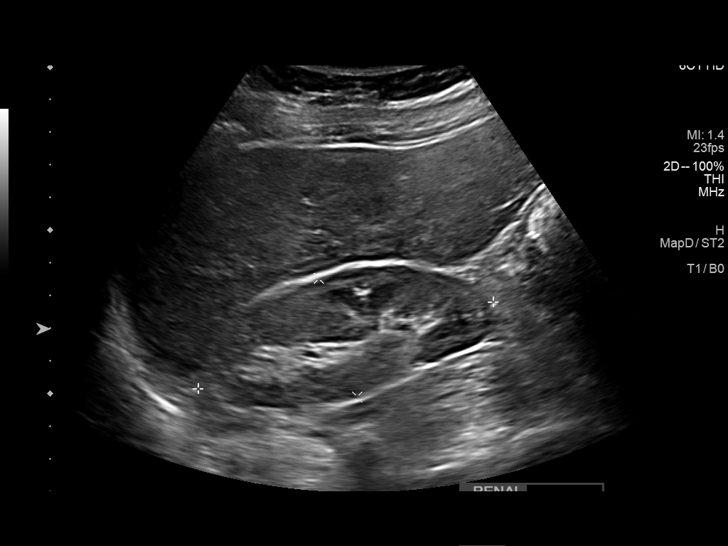
[im 8/89]
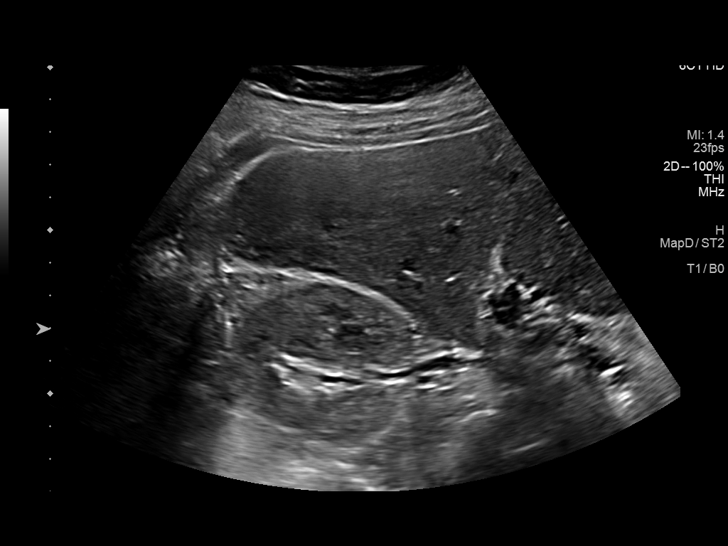
[im 15/89]
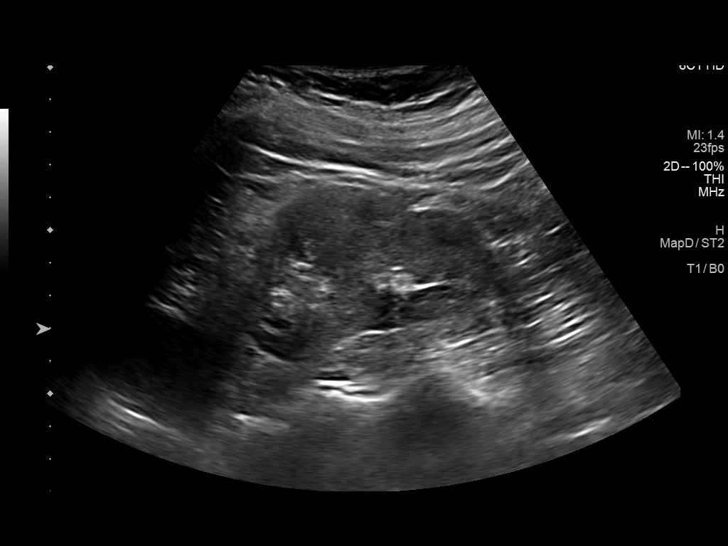
[im 23/89]
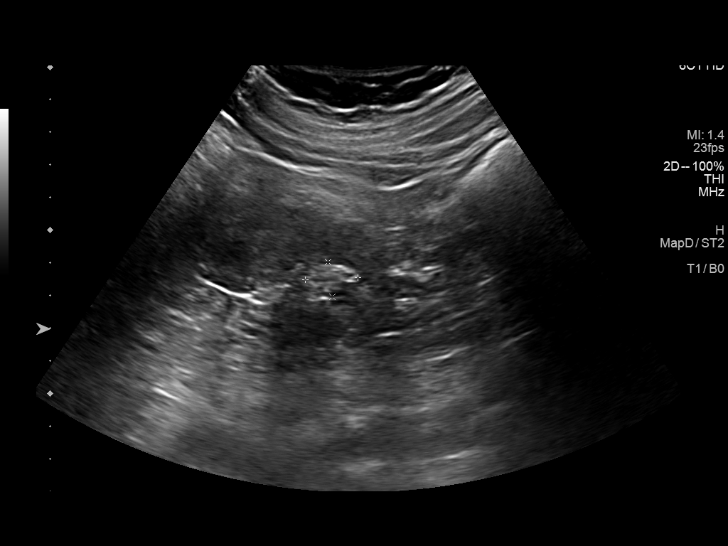
[im 30/89]
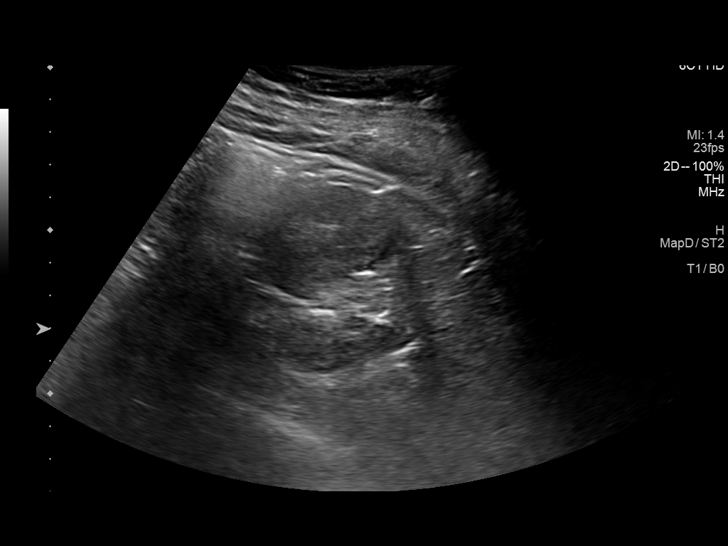
[im 37/89]
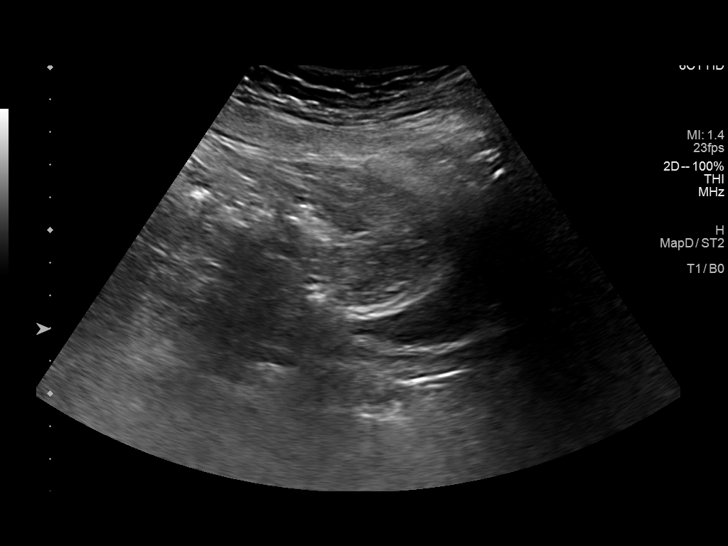
[im 45/89]
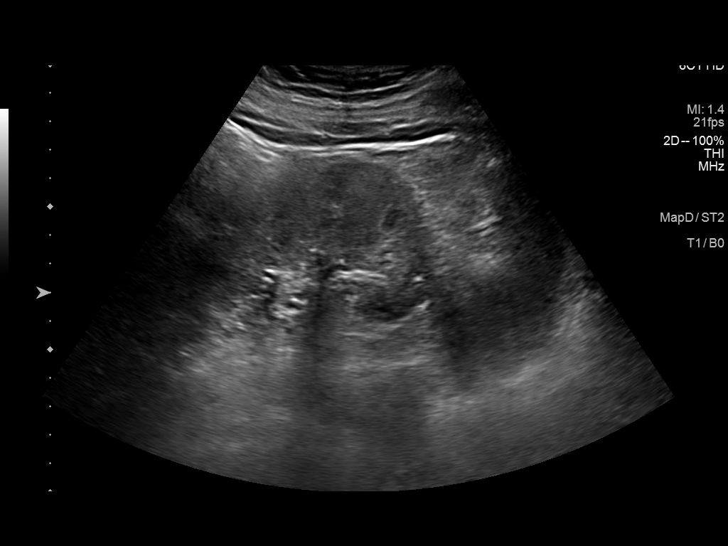
[im 52/89]
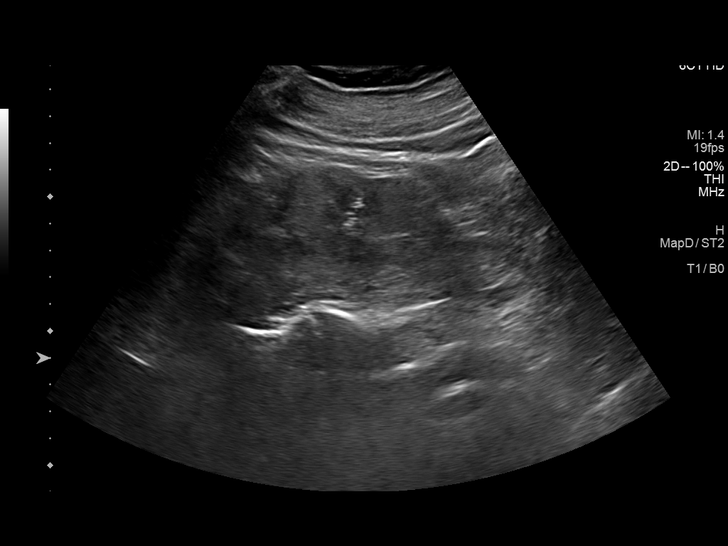
[im 59/89]
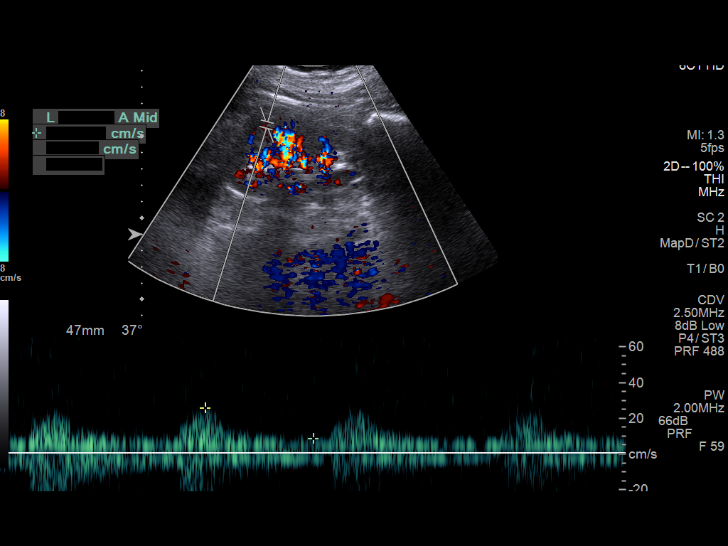
[im 67/89]
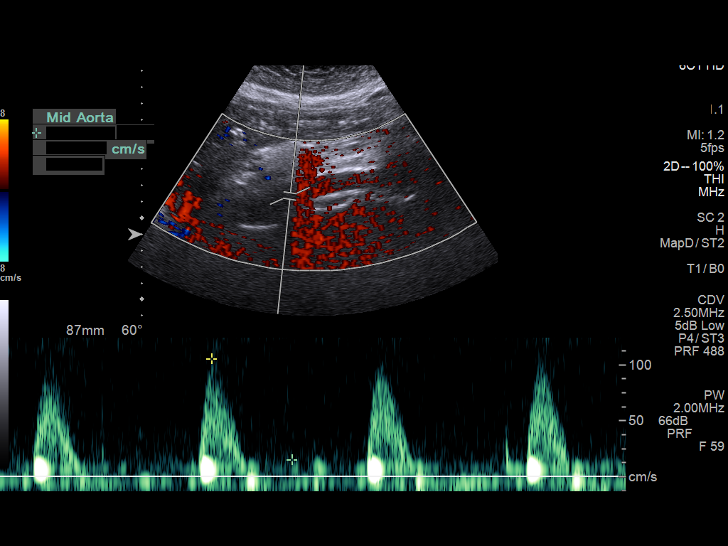
[im 74/89]
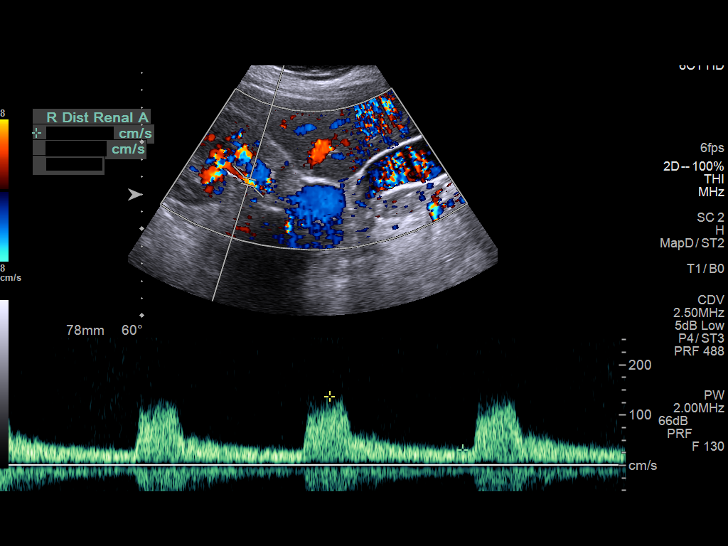
[im 81/89]
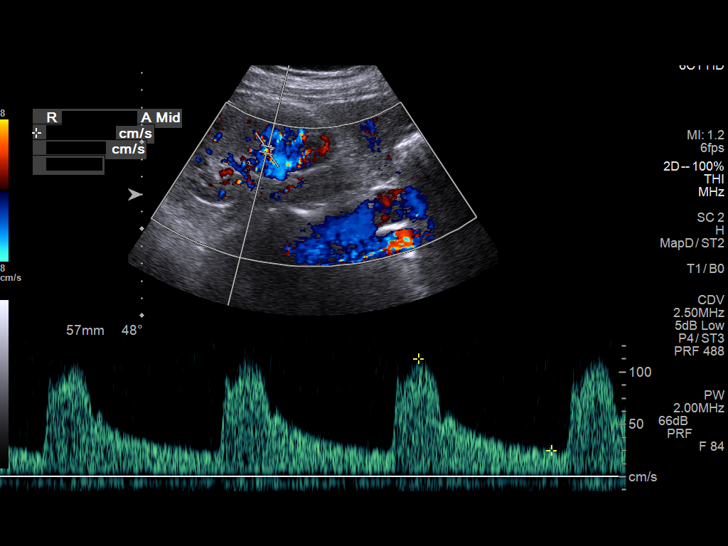
[im 89/89]
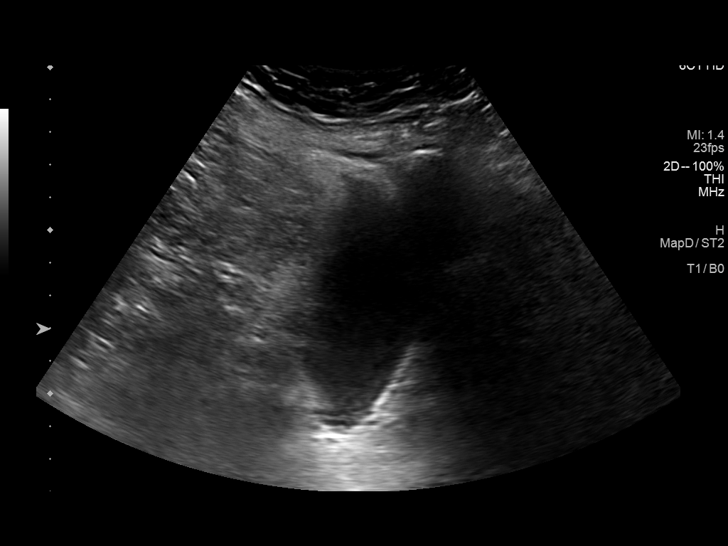

[13 of 25 positions shown; findings below may reference images not displayed]

FINDINGS: Right Kidney:

Length: 9.4 x 3.8 x 5.5 cm (104 cc). Parenchyma is slightly
echogenic compared to adjacent liver. No focal lesion or
hydronephrosis. Renal vein patent at the hilum.

Left Kidney:

Length: 7.8 x 4.7 x 5.1 cm (98 cc). No hydronephrosis. Stable
midpole lobular masslike isoechoic regions, favor dromedary hump
given absence of conspicuous vascular distortion on limited color
Doppler images. Left renal vein patent at the hilum.

Bladder:  Incompletely distended, unremarkable

RENAL DUPLEX ULTRASOUND

Right Renal Artery Velocities:

Origin:  181 cm/sec

Mid:  219 cm/sec

Hilum:  137 cm/sec

Interlobar:  80 cm/sec

Arcuate:  54 cm/sec

Left Renal Artery Velocities:

Origin:  103 cm/sec

Mid:  1.4 cm/sec

Hilum:  146 cm/sec

Interlobar:  68 cm/sec

Arcuate:  29 cm/sec

Aortic Velocity:  106 cm/sec

Right Renal-Aortic Ratios:

Origin:

Mid:

Hilum:

Interlobar:

Arcuate:

Left Renal-Aortic Ratios:

Origin: 1

Mid: 1

Hilum:

Interlobar:

Arcuate:
IMPRESSION: 1. Borderline elevated peak systolic velocities in the mid right
renal artery.
If there is continued clinical concern, renal MRA (lower radiation
risk, can be performed noncontrast in the setting of renal
dysfunction) and CTA ( higher spatial resolution) represent more
accurate studies, which are additionally more sensitive to the
detection of duplicated renal arteries.
2. No hydronephrosis

## 2022-05-06 ENCOUNTER — Other Ambulatory Visit: Payer: Self-pay | Admitting: Internal Medicine

## 2022-05-06 DIAGNOSIS — I10 Essential (primary) hypertension: Secondary | ICD-10-CM | POA: Diagnosis not present

## 2022-05-06 DIAGNOSIS — E875 Hyperkalemia: Secondary | ICD-10-CM | POA: Diagnosis not present

## 2022-05-06 DIAGNOSIS — R809 Proteinuria, unspecified: Secondary | ICD-10-CM | POA: Diagnosis not present

## 2022-05-06 DIAGNOSIS — N1832 Chronic kidney disease, stage 3b: Secondary | ICD-10-CM | POA: Diagnosis not present

## 2022-05-07 ENCOUNTER — Other Ambulatory Visit: Payer: Self-pay | Admitting: Internal Medicine

## 2022-05-13 ENCOUNTER — Telehealth: Payer: Self-pay | Admitting: Internal Medicine

## 2022-05-13 NOTE — Telephone Encounter (Signed)
Type of forms received:Medical Form  Routed [id number]  Paperwork received by : Gwynn Burly   Individual made aware of 3-5 business day turn around (Y/N): Y  Form completed and patient made aware of charges(Y/N): Y   Faxed to :   Form location: Place in PCP folder

## 2022-05-14 DIAGNOSIS — N1832 Chronic kidney disease, stage 3b: Secondary | ICD-10-CM | POA: Diagnosis not present

## 2022-05-14 NOTE — Telephone Encounter (Signed)
Form placed in Dr Sheri [name] inbox on his desk. I am thinking she needs an official eye exam with an optometrist or ophthalmologist because it asks for a corrected visual exam. Also pt did not sign the bottom so I do not think we can send the records they are requesting.

## 2022-05-14 NOTE — Telephone Encounter (Signed)
Thank you :)

## 2022-05-14 NOTE — Telephone Encounter (Signed)
Pt came in and stated she has seen the eye doctoer. She said all she needed was a letter stating no restrictions. We wrote a letter for her to give to them. If they still want her records, she will bring the form back signed. We feel no possible employer should be asking for her medical records.

## 2022-05-14 NOTE — Telephone Encounter (Signed)
Left message on cell per DPR advising pt to find out if they will accept a visual exam from our office if if they are requiring she see a specialist for a more through exam. Also, if they will accept a letter from Dr Silvio Pate, we can do that. But, if the are requiring her medical records for her health conditions, she will need to come sign this form. I have placed the form under my computer screen in a blue folder labeled waiting on pt.

## 2022-05-14 NOTE — Telephone Encounter (Signed)
Patient returned your phone call,I read the message that you left for her ,she said she would come by to sign the form that you are stating,and just to get more clarification.

## 2022-05-20 ENCOUNTER — Other Ambulatory Visit: Payer: Self-pay | Admitting: Internal Medicine

## 2022-05-20 NOTE — Telephone Encounter (Signed)
Have her set up a wellness visit in about 6 months

## 2022-05-20 NOTE — Telephone Encounter (Signed)
Last filled 04-17-22 #90 Last OV 10-08-21 No Future OV [facility] [address]

## 2022-05-20 NOTE — Telephone Encounter (Signed)
LVM for patient to call and schedule

## 2022-06-19 ENCOUNTER — Other Ambulatory Visit: Payer: Self-pay | Admitting: Internal Medicine

## 2022-06-19 NOTE — Telephone Encounter (Signed)
Last filled 8--23 #90 Last OV 10-08-21 Next OV 11-19-22 [facility] [address] [address]

## 2022-07-13 ENCOUNTER — Other Ambulatory Visit: Payer: Self-pay | Admitting: Internal Medicine

## 2022-07-13 NOTE — Telephone Encounter (Signed)
Last filled 02-13-22 #30 Last OV 10-08-21 Next OV 11-19-22 [facility] [address] [facility] [place]

## 2022-07-17 ENCOUNTER — Other Ambulatory Visit: Payer: Self-pay | Admitting: Internal Medicine

## 2022-07-17 NOTE — Telephone Encounter (Signed)
Last filled 06/19/22 # 90 0 refill LOV 10/08/21 Next visit 11/19/22 [facility] [address]

## 2022-07-23 DIAGNOSIS — Z6824 Body mass index (BMI) 24.0-24.9, adult: Secondary | ICD-10-CM | POA: Diagnosis not present

## 2022-07-23 DIAGNOSIS — Z01419 Encounter for gynecological examination (general) (routine) without abnormal findings: Secondary | ICD-10-CM | POA: Diagnosis not present

## 2022-07-23 DIAGNOSIS — Z1231 Encounter for screening mammogram for malignant neoplasm of breast: Secondary | ICD-10-CM | POA: Diagnosis not present

## 2022-07-23 DIAGNOSIS — Z130 Encounter for screening for diseases of the blood and blood-forming organs and certain disorders involving the immune mechanism: Secondary | ICD-10-CM | POA: Diagnosis not present

## 2022-07-23 DIAGNOSIS — Z1272 Encounter for screening for malignant neoplasm of vagina: Secondary | ICD-10-CM | POA: Diagnosis not present

## 2022-08-09 ENCOUNTER — Other Ambulatory Visit: Payer: Self-pay | Admitting: Internal Medicine

## 2022-08-14 ENCOUNTER — Other Ambulatory Visit: Payer: Self-pay | Admitting: Internal Medicine

## 2022-08-19 ENCOUNTER — Other Ambulatory Visit: Payer: Self-pay | Admitting: Internal Medicine

## 2022-08-20 NOTE — Telephone Encounter (Signed)
Last filled 07/17/22 # 90 0 refill LOV 10/08/21 Next visit 11/19/22 [facility] [address] [address]

## 2022-09-11 ENCOUNTER — Other Ambulatory Visit: Payer: Self-pay | Admitting: Internal Medicine

## 2022-09-11 DIAGNOSIS — H40013 Open angle with borderline findings, low risk, bilateral: Secondary | ICD-10-CM | POA: Diagnosis not present

## 2022-09-18 ENCOUNTER — Other Ambulatory Visit: Payer: Self-pay | Admitting: Internal Medicine

## 2022-09-22 ENCOUNTER — Telehealth: Payer: Self-pay | Admitting: Internal Medicine

## 2022-09-22 NOTE — Telephone Encounter (Signed)
This has already been taken care of

## 2022-09-22 NOTE — Telephone Encounter (Signed)
  Encourage patient to contact the pharmacy for refills or they can request refills through [facility]  Did the patient contact the pharmacy:  YES   LAST APPOINTMENT DATE: 10/08/21  NEXT APPOINTMENT DATE:  MEDICATION:traMADol (ULTRAM) 50 MG tablet   Is the patient out of medication?   If not, how much is left? 1 LEFT  Is this a 90 day supply: YES  PHARMACY: [facility] [place], [facility] [address] Phone: [phone]  Fax: [phone]      Let patient know to contact pharmacy at the end of the day to make sure medication is ready.  Please notify patient to allow 48-72 hours to process

## 2022-09-22 NOTE — Telephone Encounter (Signed)
Last filled 08/20/22 # 90 0 refill LOV 10/08/21 Next visit 11/19/22 [facility] [address]

## 2022-10-23 ENCOUNTER — Other Ambulatory Visit: Payer: Self-pay | Admitting: Internal Medicine

## 2022-10-23 NOTE — Telephone Encounter (Signed)
Last filled 09-22-22 # 90  LOV 10/08/21 Next visit 11/19/22 [facility] [address]

## 2022-11-06 DIAGNOSIS — N1832 Chronic kidney disease, stage 3b: Secondary | ICD-10-CM | POA: Diagnosis not present

## 2022-11-12 DIAGNOSIS — G479 Sleep disorder, unspecified: Secondary | ICD-10-CM | POA: Diagnosis not present

## 2022-11-12 DIAGNOSIS — E669 Obesity, unspecified: Secondary | ICD-10-CM | POA: Diagnosis not present

## 2022-11-12 DIAGNOSIS — N1832 Chronic kidney disease, stage 3b: Secondary | ICD-10-CM | POA: Diagnosis not present

## 2022-11-12 DIAGNOSIS — E875 Hyperkalemia: Secondary | ICD-10-CM | POA: Diagnosis not present

## 2022-11-12 DIAGNOSIS — I1A0 Resistant hypertension: Secondary | ICD-10-CM | POA: Diagnosis not present

## 2022-11-16 ENCOUNTER — Other Ambulatory Visit: Payer: Self-pay | Admitting: Internal Medicine

## 2022-11-16 DIAGNOSIS — I1A0 Resistant hypertension: Secondary | ICD-10-CM

## 2022-11-19 ENCOUNTER — Ambulatory Visit (INDEPENDENT_AMBULATORY_CARE_PROVIDER_SITE_OTHER): Payer: BLUE CROSS/BLUE SHIELD | Admitting: Internal Medicine

## 2022-11-19 ENCOUNTER — Encounter: Payer: Self-pay | Admitting: Internal Medicine

## 2022-11-19 VITALS — BP 160/72 | HR 74 | Temp 97.6°F | Ht 66.5 in | Wt 153.0 lb

## 2022-11-19 DIAGNOSIS — I1A0 Resistant hypertension: Secondary | ICD-10-CM | POA: Diagnosis not present

## 2022-11-19 DIAGNOSIS — N184 Chronic kidney disease, stage 4 (severe): Secondary | ICD-10-CM

## 2022-11-19 DIAGNOSIS — F39 Unspecified mood [affective] disorder: Secondary | ICD-10-CM

## 2022-11-19 DIAGNOSIS — M797 Fibromyalgia: Secondary | ICD-10-CM

## 2022-11-19 DIAGNOSIS — Z0000 Encounter for general adult medical examination without abnormal findings: Secondary | ICD-10-CM | POA: Diagnosis not present

## 2022-11-19 DIAGNOSIS — F1120 Opioid dependence, uncomplicated: Secondary | ICD-10-CM

## 2022-11-19 DIAGNOSIS — N2581 Secondary hyperparathyroidism of renal origin: Secondary | ICD-10-CM

## 2022-11-19 MED ORDER — TRAMADOL HCL 50 MG PO TABS: 50.0000 mg | ORAL_TABLET | Freq: Three times a day (TID) | ORAL | 0 refills | Status: DC | PRN

## 2022-11-19 NOTE — Assessment & Plan Note (Signed)
Chronic pain Functional with the tramadol

## 2022-11-19 NOTE — Progress Notes (Signed)
Subjective:    Patient ID: Sheri [name], female    DOB: 1951-08-03, 72 y.o.   MRN: [id number]  HPI Here for physical and follow up of chronic health conditions Reviewed advanced directives Reviewed other doctors----Dr Tomblin---gyn, Dr Charlies Silvers, Dr Wayne Sever, [place] (dentist), Dr Darrold Span No hospitalizations or surgery in the past year Vision is okay Hearing is fine Stays active--lots of walking at work--but no set exercise No alcohol or tobacco No falls Not really depressed--just tired. Not anhedonic--mostly church Independent with instrumental ADLs No memory problems  She feels exhausted Not sleeping well --but nothing new Dr Joylene Grapes is looking into setting up sleep study No energy during the day--now working 4:30PM to 3AM (not new)  Apparently renal function changed--told she needs MRI Last GFR I see was 17 from Dr Joylene Grapes in August---but just had repeat that I haven't gotten Olmesartan dose cut in half--but BP was up there also Known elevated PTH  Ongoing fecal incontinence Has found probiotic that does help some--but it persists  Chronic pain issues Does okay with the tramadol--helps her keep up with all her work and employment  No chest pain other than indigestion at times Continues on omeprazole No dysphagia No SOB No syncope---but does have some dizziness if she gets up quick No edema  Current Outpatient Medications on File Prior to Visit  Medication Sig Dispense Refill   amLODipine (NORVASC) 5 MG tablet Take 1 tablet (5 mg total) by mouth daily. 90 tablet 3   aspirin 81 MG tablet Take 81 mg by mouth daily.     BIOTIN PO Take by mouth.     chlorthalidone (HYGROTON) 25 MG tablet TAKE 1 TABLET(25 MG) BY MOUTH DAILY 90 tablet 3   Cyanocobalamin (B-12 PO) Take by mouth.     cyclobenzaprine (FLEXERIL) 10 MG tablet TAKE 1/2 TO 1 TABLET(5 TO 10 MG) BY MOUTH AT BEDTIME AS NEEDED FOR MUSCLE SPASMS 30 tablet 1   GARLIC PO Take by mouth.      Multiple Vitamins-Minerals (ZINC PO) Take by mouth.     olmesartan (BENICAR) 40 MG tablet TAKE 1 TABLET(40 MG) BY MOUTH DAILY 90 tablet 3   omeprazole (PRILOSEC) 20 MG capsule TAKE 1 CAPSULE(20 MG) BY MOUTH DAILY 90 capsule 3   OVER THE COUNTER MEDICATION Super Beets Heart Chews     psyllium (METAMUCIL) 58.6 % powder Take 1 packet by mouth daily.     traMADol (ULTRAM) 50 MG tablet TAKE 1 TABLET BY MOUTH THREE TIMES DAILY AS NEEDED 90 tablet 0   Vitamin D, Cholecalciferol, 10 MCG (400 UNIT) TABS Take by mouth.     No current facility-administered medications on file prior to visit.    Allergies  Allergen Reactions   Codeine Nausea And Vomiting   Statins Other (See Comments)    Caused thinning hair/hair to fall out    Past Medical History:  Diagnosis Date   Allergy    GERD (gastroesophageal reflux disease)    Hypertension    IBS (irritable bowel syndrome)    Nocturia    Personal history of colonic adenomas 10/09/2013   Tubular adenoma    Wrist fracture, left 09/21/2012    Past Surgical History:  Procedure Laterality Date   ABDOMINAL HYSTERECTOMY     CARPAL TUNNEL RELEASE  1982   rt   CARPAL TUNNEL RELEASE Right 09/12/2013   Procedure: RIGHT CARPAL TUNNEL RELEASE;  Surgeon: Cammie Sickle., MD;  Location: [place];  Service: Orthopedics;  Laterality: Right;  COLONOSCOPY  2015, 2018   DORSAL COMPARTMENT RELEASE Left 03/16/2013   Procedure: LEFT FIRST RELEASE DORSAL COMPARTMENT (DEQUERVAIN) EXCISION OF CYST  A1 PULLEY LEFT THUMB;  Surgeon: Cammie Sickle., MD;  Location: [place];  Service: Orthopedics;  Laterality: Left;   ESOPHAGOGASTRODUODENOSCOPY (EGD) WITH ESOPHAGEAL DILATION  03/2017   ring   TONSILLECTOMY     TUBAL LIGATION      Family History  Problem Relation Age of Onset   Dementia Mother        Lewy body   Hypertension Sister    Cancer Sister        breast   Colon cancer Neg Hx    Esophageal cancer Neg Hx    Rectal  cancer Neg Hx    Stomach cancer Neg Hx    Heart disease Neg Hx     Social History   Socioeconomic History   Marital status: Divorced    Spouse name: Not on file   Number of children: 2   Years of education: 13   Highest education level: Not on file  Occupational History   Occupation: [employer]    Comment: Medi   Occupation: Battery work    Comment: will be starting at [employer] soon  Tobacco Use   Smoking status: Former    Types: Cigarettes    Quit date: 10/13/2002    Years since quitting: 20.1   Smokeless tobacco: Never  Vaping Use   Vaping Use: Never used  Substance and Sexual Activity   Alcohol use: No   Drug use: No   Sexual activity: Never  Other Topics Concern   Not on file  Social History Narrative   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 Married '73 - 18 yr/divorced. 1 dtr - '82, 1 son - '73; 1 grandchild.    Lives in her own place         No formal living will   Son, then daughter, should make health care POA   Would accept resuscitation   Not sure about tube feeds--but wouldn't want if cognitively unaware   Social Determinants of Health   Financial Resource Strain: Not on file  Food Insecurity: Not on file  Transportation Needs: Not on file  Physical Activity: Not on file  Stress: Not on file  Social Connections: Not on file  Intimate Partner Violence: Not on file   Review of Systems Appetite is fair Weight is stable Poor sleep Wears seat belt Having some dental work Needs to go back to derm--hair is thinning  Voids okay---nocturia x 3. Some urge incontinence--wears pads    Objective:   Physical Exam Constitutional:      Appearance: Normal appearance.  HENT:     Mouth/Throat:     Pharynx: No oropharyngeal exudate or posterior oropharyngeal erythema.  Eyes:     Conjunctiva/sclera: Conjunctivae normal.     Pupils: Pupils are equal, round, and reactive to light.  Cardiovascular:     Rate and Rhythm: Normal rate and regular rhythm.     Pulses: Normal  pulses.     Heart sounds: No murmur heard.    No gallop.  Pulmonary:     Effort: Pulmonary effort is normal.     Breath sounds: Normal breath sounds. No wheezing or rales.  Abdominal:     Palpations: Abdomen is soft.     Tenderness: There is no abdominal tenderness.  Musculoskeletal:     Cervical back: Neck supple.     Right lower leg:  No edema.     Left lower leg: No edema.  Lymphadenopathy:     Cervical: No cervical adenopathy.  Skin:    Findings: No rash.  Neurological:     General: No focal deficit present.     Mental Status: She is alert and oriented to person, place, and time.  Psychiatric:        Mood and Affect: Mood normal.        Behavior: Behavior normal.            Assessment & Plan:

## 2022-11-19 NOTE — Assessment & Plan Note (Signed)
PDMP reviewed No concerns 

## 2022-11-19 NOTE — Assessment & Plan Note (Signed)
BP Readings from Last 3 Encounters:  11/19/22 (!) 160/72  10/08/21 138/70  07/16/21 140/80   Some higher Working with Dr Joylene Grapes on this Olmesartan ?'20mg'$  (checking for renal artery stenosis), chlorthalidone 25, amlodipine 5

## 2022-11-19 NOTE — Assessment & Plan Note (Signed)
Working with nephrologist Is on olmesartan

## 2022-11-19 NOTE — Assessment & Plan Note (Signed)
Is on vitamin D

## 2022-11-19 NOTE — Assessment & Plan Note (Signed)
Active at work Difficult hours Had mammogram recently at gyn--awaiting copy Colon due again 2029 No pap due to age---sees gyn Had shingrix at [facility] Flu vaccine in fall Recommended COVID/RSV by the fall

## 2022-11-19 NOTE — Assessment & Plan Note (Signed)
Mostly better---but now tired, etc Looking into sleep study, etc

## 2022-12-03 ENCOUNTER — Encounter: Payer: Self-pay | Admitting: Internal Medicine

## 2022-12-11 ENCOUNTER — Ambulatory Visit
Admission: RE | Admit: 2022-12-11 | Discharge: 2022-12-11 | Disposition: A | Discharge status: Home / Self Care | Payer: BLUE CROSS/BLUE SHIELD | Source: Ambulatory Visit | Attending: Internal Medicine | Admitting: Internal Medicine

## 2022-12-11 DIAGNOSIS — I1A0 Resistant hypertension: Secondary | ICD-10-CM

## 2022-12-11 DIAGNOSIS — K7689 Other specified diseases of liver: Secondary | ICD-10-CM | POA: Diagnosis not present

## 2022-12-11 MED ORDER — GADOPICLENOL 0.5 MMOL/ML IV SOLN
7.0000 mL | Freq: Once | INTRAVENOUS | Status: AC | PRN
  Administered 2022-12-11: 7 mL via INTRAVENOUS

## 2022-12-22 ENCOUNTER — Other Ambulatory Visit: Payer: Self-pay | Admitting: Internal Medicine

## 2022-12-22 NOTE — Telephone Encounter (Signed)
Last filled 11-19-22 # 90  Last OV 11-19-22 Next visit 05-20-23 [facility] [address] [address]

## 2023-01-05 DIAGNOSIS — I1A0 Resistant hypertension: Secondary | ICD-10-CM | POA: Diagnosis not present

## 2023-01-05 DIAGNOSIS — G479 Sleep disorder, unspecified: Secondary | ICD-10-CM | POA: Diagnosis not present

## 2023-01-05 DIAGNOSIS — N1832 Chronic kidney disease, stage 3b: Secondary | ICD-10-CM | POA: Diagnosis not present

## 2023-01-05 DIAGNOSIS — E875 Hyperkalemia: Secondary | ICD-10-CM | POA: Diagnosis not present

## 2023-01-05 LAB — LAB REPORT - SCANNED
Albumin, Urine POC: 38.1
Creatinine, POC: 115.9 mg/dL
EGFR: 30
Microalb Creat Ratio: 33

## 2023-01-06 ENCOUNTER — Other Ambulatory Visit: Payer: Self-pay | Admitting: Internal Medicine

## 2023-01-06 DIAGNOSIS — I701 Atherosclerosis of renal artery: Secondary | ICD-10-CM

## 2023-01-08 ENCOUNTER — Other Ambulatory Visit: Payer: Self-pay | Admitting: Internal Medicine

## 2023-01-22 ENCOUNTER — Other Ambulatory Visit: Payer: Self-pay | Admitting: Internal Medicine

## 2023-01-22 NOTE — Telephone Encounter (Signed)
Last filled 12-22-22 # 90  Last OV 11-19-22 Next visit 05-20-23 [facility] [address] [address]

## 2023-01-25 ENCOUNTER — Telehealth: Payer: Self-pay | Admitting: *Deleted

## 2023-01-25 NOTE — Telephone Encounter (Signed)
Dr Valentino Nose referred Ms Sheri [name] to Dr Loreta Ave  for renal artery stenosis. I have left several messages for Ms Sheri [name]. She has not returned any of my calls.Pearla Dubonnet

## 2023-02-12 ENCOUNTER — Ambulatory Visit
Admission: RE | Admit: 2023-02-12 | Discharge: 2023-02-12 | Disposition: A | Discharge status: Home / Self Care | Payer: BLUE CROSS/BLUE SHIELD | Source: Ambulatory Visit | Attending: Internal Medicine | Admitting: Internal Medicine

## 2023-02-12 DIAGNOSIS — I10 Essential (primary) hypertension: Secondary | ICD-10-CM | POA: Diagnosis not present

## 2023-02-12 DIAGNOSIS — I701 Atherosclerosis of renal artery: Secondary | ICD-10-CM

## 2023-02-12 HISTORY — PX: IR RADIOLOGIST EVAL & MGMT: IMG5224

## 2023-02-12 NOTE — Consult Note (Signed)
Chief Complaint: HTN  Referring Physician(s): Peeples,Samuel J  PCP: Dr. Alphonsus Sias  History of Present Illness: Sheri [name] is a 72 y.o. female presenting as a scheduled consultation to [facility], kindly referred by Dr. Valentino Nose, for evaluation of possible renovascular HTN and possible angiogram.   Sheri [name] is unable to join [place] in the clinic, as she has just started new employment with [employer], and cannot take time to come in.  She joins [place] by phone and we confirmed her identity with 2 personal identifiers.   She tells me that her hypertension was discovered by her PCP several years ago.  She tells me she is currently taking several medications and remains elevated.    Dr. Valentino Nose notes from office visit 2/29 indicate her strategy is: olmesartan 20mg  daily, amlodipine 10 mg daily, chlorthalidone 50mg  daily (she says she is now taking twice), metoprolol 25 mg daily, and consider spironolactone.   She denies any headaches.  She denies any hospitalization for chest pain, CHF, pulmonary edema.  She denies prior MI or stroke.    No prior ECHO.  ECG from 03/15/2013 with NSR, ?LVH.   CV risk factors include: HTN, smoking (age 45 - 72yo, at least 1ppd, at most 3ppd, ~35py history)   She has an allergy to statins.   Duplex of the renal arteries was completed 08/13/21: Right: borderline elevated at the origin, with mid-segment measured >200cm/sec  MRA was then completed 12/11/22, with evidence of possible corresponding right renal artery stenosis     Past Medical History:  Diagnosis Date   Allergy    GERD (gastroesophageal reflux disease)    Hypertension    IBS (irritable bowel syndrome)    Nocturia    Personal history of colonic adenomas 10/09/2013   Tubular adenoma    Wrist fracture, left 09/21/2012    Past Surgical History:  Procedure Laterality Date   ABDOMINAL HYSTERECTOMY     CARPAL TUNNEL RELEASE  1982   rt   CARPAL TUNNEL RELEASE Right 09/12/2013   Procedure:  RIGHT CARPAL TUNNEL RELEASE;  Surgeon: Wyn Forster., MD;  Location: [facility];  Service: Orthopedics;  Laterality: Right;   COLONOSCOPY  2015, 2018   DORSAL COMPARTMENT RELEASE Left 03/16/2013   Procedure: LEFT FIRST RELEASE DORSAL COMPARTMENT (DEQUERVAIN) EXCISION OF CYST  A1 PULLEY LEFT THUMB;  Surgeon: Wyn Forster., MD;  Location:  SURGERY CENTER;  Service: Orthopedics;  Laterality: Left;   ESOPHAGOGASTRODUODENOSCOPY (EGD) WITH ESOPHAGEAL DILATION  03/2017   ring   TONSILLECTOMY     TUBAL LIGATION      Allergies: Codeine and Statins  Medications: Prior to Admission medications   Medication Sig Start Date End Date Taking? Authorizing Provider  amLODipine (NORVASC) 5 MG tablet Take 1 tablet (5 mg total) by mouth daily. 05/27/21   Karie Schwalbe, MD  aspirin 81 MG tablet Take 81 mg by mouth daily.    [provider]  BIOTIN PO Take by mouth.    [provider]  chlorthalidone (HYGROTON) 25 MG tablet TAKE 1 TABLET(25 MG) BY MOUTH DAILY 08/17/22   Karie Schwalbe, MD  Cyanocobalamin (B-12 PO) Take by mouth.    [provider]  cyclobenzaprine (FLEXERIL) 10 MG tablet TAKE 1/2 TO 1 TABLET(5 TO 10 MG) BY MOUTH AT BEDTIME AS NEEDED FOR MUSCLE SPASMS 01/08/23   Karie Schwalbe, MD  GARLIC PO Take by mouth.    [provider]  Multiple Vitamins-Minerals (ZINC PO)  Take by mouth.    [provider]  olmesartan (BENICAR) 40 MG tablet TAKE 1 TABLET(40 MG) BY MOUTH DAILY 08/17/22   Karie Schwalbe, MD  omeprazole (PRILOSEC) 20 MG capsule TAKE 1 CAPSULE(20 MG) BY MOUTH DAILY 08/10/22   Karie Schwalbe, MD  OVER THE COUNTER MEDICATION Super Beets Heart Chews    [provider]  psyllium (METAMUCIL) 58.6 % powder Take 1 packet by mouth daily.    [provider]  traMADol (ULTRAM) 50 MG tablet TAKE 1 TABLET(50 MG) BY MOUTH THREE TIMES DAILY AS NEEDED 01/22/23   Karie Schwalbe, MD  Vitamin D,  Cholecalciferol, 10 MCG (400 UNIT) TABS Take by mouth.    [provider]     Family History  Problem Relation Age of Onset   Dementia Mother        Lewy body   Hypertension Sister    Cancer Sister        breast   Colon cancer Neg Hx    Esophageal cancer Neg Hx    Rectal cancer Neg Hx    Stomach cancer Neg Hx    Heart disease Neg Hx     Social History   Socioeconomic History   Marital status: Divorced    Spouse name: Not on file   Number of children: 2   Years of education: 13   Highest education level: Not on file  Occupational History   Occupation: [employer]    Comment: Medi   Occupation: Battery work    Comment: will be starting at [employer] soon  Tobacco Use   Smoking status: Former    Types: Cigarettes    Quit date: 10/13/2002    Years since quitting: 20.3   Smokeless tobacco: Never  Vaping Use   Vaping Use: Never used  Substance and Sexual Activity   Alcohol use: No   Drug use: No   Sexual activity: Never  Other Topics Concern   Not on file  Social History Narrative   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 Married '73 - 18 yr/divorced. 1 dtr - '82, 1 son - '73; 1 grandchild.    Lives in her own place         No formal living will   Son, then daughter, should make health care POA   Would accept resuscitation   Not sure about tube feeds--but wouldn't want if cognitively unaware   Social Determinants of Health   Financial Resource Strain: Not on file  Food Insecurity: Not on file  Transportation Needs: Not on file  Physical Activity: Not on file  Stress: Not on file  Social Connections: Not on file       Review of Systems  Review of Systems: A 12 point ROS discussed and pertinent positives are indicated in the HPI above.  All other systems are negative.  Advance Care Plan: The advanced care plan/surrogate decision maker was discussed at the time of visit and documented in the medical record.    Physical Exam No direct physical exam was performed  (except for noted visual exam findings with Video Visits).    Vital Signs: There were no vitals taken for this visit.  Imaging: No results found.  Labs:  CBC: No results for input(s): "WBC", "HGB", "HCT", "PLT" in the last 8760 hours.  COAGS: No results for input(s): "INR", "APTT" in the last 8760 hours.  BMP: No results for input(s): "NA", "K", "CL", "CO2", "GLUCOSE", "BUN", "CALCIUM", "CREATININE", "GFRNONAA", "GFRAA" in the last  8760 hours.  Invalid input(s): "CMP"  LIVER FUNCTION TESTS: No results for input(s): "BILITOT", "AST", "ALT", "ALKPHOS", "PROT", "ALBUMIN" in the last 8760 hours.  TUMOR MARKERS: No results for input(s): "AFPTM", "CEA", "CA199", "CHROMGRNA" in the last 8760 hours.  Assessment and Plan:  Sheri Reineke is 72 yo female with history of resistant hypertension on multiple blood pressure medications, and multiple CV risk factors, with multiple modalities (including duplex and MRA) suggesting right renal artery stenosis.    I spoke to her today regarding the condition of HTN, generally that the diagnosis is >90% related to essential hypertension, but sometimes renovascular HTN.  We discussed general role of the kidney with blood pressure, and also the risk of uncontrolled HTN; namely stroke risk and risk of cardiorenal syndrome/cardiac events.   We further discussed the role for angiogram which would be to confirm or rule-out a high grade stenosis, and that we would treat any high-grade stenosis discovered with PTA/stenting.   Regarding endovascular options, specific risks discussed include: bleeding, infection, contrast reaction, renal injury/nephropathy, arterial injury/dissection, need for additional procedure/surgery, worsening symptoms/organ injury, cardiopulmonary collapse, death.    She understands, and voices that she would like to have the angiogram.  Her major obstacle is her new employment.    Plan: - She is going to work to get some time off so that  we can proceed with angiogram and possible RAS.  She says she will reach out to our office [phone] when able.  - Angiogram and possible RAS would be at [facility] with Dr. Loreta Ave - Continue maximal medical therapy    Thank you for this interesting consult.  I greatly enjoyed meeting Winifred Crummey and look forward to participating in their care.  A copy of this report was sent to the requesting provider on this date.  Electronically Signed: Gilmer Mor 02/12/2023, 4:37 PM   I spent a total of  60 Minutes   in remote  clinical consultation, greater than 50% of which was counseling/coordinating care for HTN, possible renovascular HTN, possible angiogram/stent.    Visit type: Audio only (telephone). Audio (no video) only due to patient's lack of internet/smartphone capability. Alternative for in-person consultation at [facility], [address] [place], [place], [place]. This visit type was conducted due to national recommendations for restrictions regarding the COVID-19 Pandemic (e.g. social distancing).  This format is felt to be most appropriate for this patient at this time.  All issues noted in this document were discussed and addressed.

## 2023-02-21 ENCOUNTER — Other Ambulatory Visit: Payer: Self-pay | Admitting: Internal Medicine

## 2023-02-22 NOTE — Telephone Encounter (Signed)
Last filled 01-22-23 # 90  Last OV 11-19-22 Next visit 05-20-23 [facility] [address]

## 2023-03-22 DIAGNOSIS — H40013 Open angle with borderline findings, low risk, bilateral: Secondary | ICD-10-CM | POA: Diagnosis not present

## 2023-03-23 ENCOUNTER — Other Ambulatory Visit: Payer: Self-pay | Admitting: Family

## 2023-03-26 NOTE — Telephone Encounter (Signed)
Prescription Request  03/26/2023  LOV: Visit date not found  What is the name of the medication or equipment? traMADol (ULTRAM) 50 MG tablet   Have you contacted your pharmacy to request a refill? No   Which pharmacy would you like this sent to?  [id number] [place], [place] - [address] AT [facility] [address] [place] [address] Phone: [phone] Fax: [phone]    Patient notified that their request is being sent to the clinical staff for review and that they should receive a response within 2 business days.   Please advise at Mobile [phone] (mobile)

## 2023-03-26 NOTE — Telephone Encounter (Signed)
Medication: Tramadol  Directions: Take 1 tablet po TID PRN  Last given: 02/22/23 Number refills: 0 Last o/v: 11/19/22 Follow up: 05/20/23 Labs: 01/05/23

## 2023-04-06 DIAGNOSIS — E875 Hyperkalemia: Secondary | ICD-10-CM | POA: Diagnosis not present

## 2023-04-06 DIAGNOSIS — I1A0 Resistant hypertension: Secondary | ICD-10-CM | POA: Diagnosis not present

## 2023-04-06 DIAGNOSIS — K589 Irritable bowel syndrome without diarrhea: Secondary | ICD-10-CM | POA: Diagnosis not present

## 2023-04-06 DIAGNOSIS — N1832 Chronic kidney disease, stage 3b: Secondary | ICD-10-CM | POA: Diagnosis not present

## 2023-04-06 LAB — LAB REPORT - SCANNED
Albumin, Urine POC: 37.7
Creatinine, POC: 158.6 mg/dL
Microalb Creat Ratio: 24
eGFR: 22

## 2023-04-08 ENCOUNTER — Other Ambulatory Visit: Payer: Self-pay | Admitting: Internal Medicine

## 2023-04-21 ENCOUNTER — Encounter (INDEPENDENT_AMBULATORY_CARE_PROVIDER_SITE_OTHER): Payer: Self-pay

## 2023-04-26 ENCOUNTER — Other Ambulatory Visit: Payer: Self-pay | Admitting: Family

## 2023-04-26 NOTE — Telephone Encounter (Signed)
Last filled 03-26-23 #90 Last OV 11-19-22 Next OV 05-20-23 [facility] [address]

## 2023-05-20 ENCOUNTER — Ambulatory Visit (INDEPENDENT_AMBULATORY_CARE_PROVIDER_SITE_OTHER): Payer: Medicare HMO | Admitting: Internal Medicine

## 2023-05-20 ENCOUNTER — Encounter: Payer: Self-pay | Admitting: Internal Medicine

## 2023-05-20 VITALS — BP 144/72 | HR 60 | Temp 97.1°F | Ht 66.5 in | Wt 164.0 lb

## 2023-05-20 DIAGNOSIS — N184 Chronic kidney disease, stage 4 (severe): Secondary | ICD-10-CM | POA: Diagnosis not present

## 2023-05-20 DIAGNOSIS — M797 Fibromyalgia: Secondary | ICD-10-CM | POA: Diagnosis not present

## 2023-05-20 DIAGNOSIS — Z23 Encounter for immunization: Secondary | ICD-10-CM

## 2023-05-20 DIAGNOSIS — F1120 Opioid dependence, uncomplicated: Secondary | ICD-10-CM

## 2023-05-20 DIAGNOSIS — I1A0 Resistant hypertension: Secondary | ICD-10-CM | POA: Diagnosis not present

## 2023-05-20 MED ORDER — CHLORTHALIDONE 25 MG PO TABS: 25.0000 mg | ORAL_TABLET | Freq: Every day | ORAL | 3 refills | Status: DC

## 2023-05-20 NOTE — Progress Notes (Signed)
Subjective:    Patient ID: Sheri [name], female    DOB: [birth year], 72 y.o.   MRN: [id number]  HPI Here for follow up of hypertension and other chronic health conditions  Still not sleeping well Awakens 4AM---to get to work by [employer]) Often only sleeps 2 hours Stays exhausted Tries to lie down at 9PM Some nocturia--does hydrate but limits close to bedtime Reads/puzzle books before sleep Melatonin doesn't help with maintenance  Reviewed labs Last GFR from Dr Prentice Docker Notes vascular disease---needs stent put in  No chest pain or SOB  Satisfied with tramadol for the chronic pain  Current Outpatient Medications on File Prior to Visit  Medication Sig Dispense Refill   amLODipine (NORVASC) 5 MG tablet Take 1 tablet (5 mg total) by mouth daily. 90 tablet 3   aspirin 81 MG tablet Take 81 mg by mouth daily.     BIOTIN PO Take by mouth.     chlorthalidone (HYGROTON) 25 MG tablet TAKE 1 TABLET(25 MG) BY MOUTH DAILY 90 tablet 3   cyclobenzaprine (FLEXERIL) 10 MG tablet TAKE 1/2 TO 1 TABLET(5 TO 10 MG) BY MOUTH AT BEDTIME AS NEEDED FOR MUSCLE SPASMS 30 tablet 1   GARLIC PO Take by mouth.     metoprolol succinate (TOPROL-XL) 25 MG 24 hr tablet Take 25 mg by mouth daily.     Multiple Vitamins-Minerals (ZINC PO) Take by mouth.     olmesartan (BENICAR) 40 MG tablet TAKE 1 TABLET(40 MG) BY MOUTH DAILY 90 tablet 3   omeprazole (PRILOSEC) 20 MG capsule TAKE 1 CAPSULE(20 MG) BY MOUTH DAILY 90 capsule 3   OVER THE COUNTER MEDICATION Super Beets Heart Chews     traMADol (ULTRAM) 50 MG tablet Take 1 tablet (50 mg total) by mouth 3 (three) times daily as needed. 90 tablet 0   No current facility-administered medications on file prior to visit.    Allergies  Allergen Reactions   Codeine Nausea And Vomiting   Statins Other (See Comments)    Caused thinning hair/hair to fall out    Past Medical History:  Diagnosis Date   Allergy    GERD (gastroesophageal reflux disease)     Hypertension    IBS (irritable bowel syndrome)    Nocturia    Personal history of colonic adenomas 10/09/2013   Tubular adenoma    Wrist fracture, left 09/21/2012    Past Surgical History:  Procedure Laterality Date   ABDOMINAL HYSTERECTOMY     CARPAL TUNNEL RELEASE  1982   rt   CARPAL TUNNEL RELEASE Right 09/12/2013   Procedure: RIGHT CARPAL TUNNEL RELEASE;  Surgeon: Wyn Forster., MD;  Location: [facility];  Service: Orthopedics;  Laterality: Right;   COLONOSCOPY  2015, 2018   DORSAL COMPARTMENT RELEASE Left 03/16/2013   Procedure: LEFT FIRST RELEASE DORSAL COMPARTMENT (DEQUERVAIN) EXCISION OF CYST  A1 PULLEY LEFT THUMB;  Surgeon: Wyn Forster., MD;  Location:  SURGERY CENTER;  Service: Orthopedics;  Laterality: Left;   ESOPHAGOGASTRODUODENOSCOPY (EGD) WITH ESOPHAGEAL DILATION  03/2017   ring   IR RADIOLOGIST EVAL & MGMT  02/12/2023   TONSILLECTOMY     TUBAL LIGATION      Family History  Problem Relation Age of Onset   Dementia Mother        Lewy body   Hypertension Sister    Cancer Sister        breast   Colon cancer Neg Hx    Esophageal cancer Neg  Hx    Rectal cancer Neg Hx    Stomach cancer Neg Hx    Heart disease Neg Hx     Social History   Socioeconomic History   Marital status: Divorced    Spouse name: Not on file   Number of children: 2   Years of education: 13   Highest education level: Not on file  Occupational History   Occupation: [employer]    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 Married '73 - 18 yr/divorced. 1 dtr - '82, 1 son - '73; 1  grandchild.    Lives in her own place         No formal living will   Son, then daughter, should make health care POA   Would accept resuscitation   Not sure about tube feeds--but wouldn't want if cognitively unaware   Social Determinants of Health   Financial Resource Strain: Not on file  Food Insecurity: Not on file  Transportation Needs: Not on file  Physical Activity: Not on file  Stress: Not on file  Social Connections: Unknown (01/30/2022)   Received from [facility]   Social Network    Social Network: Not on file  Intimate Partner Violence: Unknown (12/22/2021)   Received from [facility]   HITS    Physically Hurt: Not on file    Insult or Talk Down To: Not on file    Threaten Physical Harm: Not on file    Scream or Curse: Not on file   Review of Systems By mistake---was taking extra chlorthalidone. Told her to stick with 25mg  only Appetite is okay--weight up some    Objective:   Physical Exam         Assessment & Plan:

## 2023-05-20 NOTE — Assessment & Plan Note (Signed)
Reviewed PDMP No concerns 

## 2023-05-20 NOTE — Assessment & Plan Note (Signed)
Hopefully renal artery stent will help

## 2023-05-20 NOTE — Assessment & Plan Note (Signed)
Chronic pain Asked her to limit tramadol close to bedtime--might be affecting sleep

## 2023-05-20 NOTE — Assessment & Plan Note (Signed)
BP Readings from Last 3 Encounters:  05/20/23 (!) 144/72  11/19/22 (!) 160/72  10/08/21 138/70   May be related to renal artery stenosis---supposed to be getting stent I will hold off on increase in meds because of this Amlodipine 5, chlorthalidone 25, olmesartan 40 Keeps up with Dr Valentino Nose also

## 2023-05-21 NOTE — Addendum Note (Signed)
Addended by: Eual Fines on: 05/21/2023 01:20 PM   Modules accepted: Orders

## 2023-05-25 ENCOUNTER — Other Ambulatory Visit: Payer: Self-pay | Admitting: Internal Medicine

## 2023-05-27 NOTE — Telephone Encounter (Signed)
Last filled 04-26-23 #90 Last OV 05-20-23 No Future OV [facility] [address]

## 2023-06-14 ENCOUNTER — Other Ambulatory Visit: Payer: Self-pay | Admitting: Internal Medicine

## 2023-06-23 ENCOUNTER — Other Ambulatory Visit (HOSPITAL_COMMUNITY): Payer: Self-pay | Admitting: Interventional Radiology

## 2023-06-23 ENCOUNTER — Telehealth (HOSPITAL_COMMUNITY): Payer: Self-pay | Admitting: Radiology

## 2023-06-23 DIAGNOSIS — I701 Atherosclerosis of renal artery: Secondary | ICD-10-CM

## 2023-06-23 NOTE — Telephone Encounter (Signed)
Called pt to schedule Rt renal angio w/ possible stenting with Dr. Loreta Ave. Left VM for pt to call me back to set up procedure on 10/17 if possible. JM

## 2023-06-25 ENCOUNTER — Other Ambulatory Visit: Payer: Self-pay | Admitting: Internal Medicine

## 2023-06-30 ENCOUNTER — Telehealth (HOSPITAL_COMMUNITY): Payer: Self-pay | Admitting: Radiology

## 2023-06-30 NOTE — Telephone Encounter (Signed)
Called pt, left VM for her to call me back to schedule her Rt renal artery stenosis tx with Dr. Loreta Ave. Pt had requested 11/27 for this procedure and Dr. Loreta Ave has now been assigned to [facility] that day and the schedule blocked for this for the pt. JM

## 2023-07-12 DIAGNOSIS — E875 Hyperkalemia: Secondary | ICD-10-CM | POA: Diagnosis not present

## 2023-07-12 DIAGNOSIS — N1832 Chronic kidney disease, stage 3b: Secondary | ICD-10-CM | POA: Diagnosis not present

## 2023-07-12 DIAGNOSIS — K589 Irritable bowel syndrome without diarrhea: Secondary | ICD-10-CM | POA: Diagnosis not present

## 2023-07-12 DIAGNOSIS — I1A0 Resistant hypertension: Secondary | ICD-10-CM | POA: Diagnosis not present

## 2023-07-13 LAB — LAB REPORT - SCANNED
Albumin, Urine POC: 160.2
Creatinine, POC: 159.5 mg/dL
EGFR: 30
Microalb Creat Ratio: 100

## 2023-07-14 ENCOUNTER — Other Ambulatory Visit: Payer: Self-pay | Admitting: Internal Medicine

## 2023-07-28 ENCOUNTER — Other Ambulatory Visit: Payer: Self-pay | Admitting: Internal Medicine

## 2023-07-28 NOTE — Telephone Encounter (Signed)
Last filled 06-25-23 #90 Last OV 05-20-23 No Future OV [facility] [address]

## 2023-07-30 DIAGNOSIS — E875 Hyperkalemia: Secondary | ICD-10-CM | POA: Diagnosis not present

## 2023-08-17 ENCOUNTER — Other Ambulatory Visit: Payer: Self-pay | Admitting: Radiology

## 2023-08-17 DIAGNOSIS — Z1231 Encounter for screening mammogram for malignant neoplasm of breast: Secondary | ICD-10-CM | POA: Diagnosis not present

## 2023-08-17 DIAGNOSIS — I701 Atherosclerosis of renal artery: Secondary | ICD-10-CM

## 2023-08-17 DIAGNOSIS — Z01419 Encounter for gynecological examination (general) (routine) without abnormal findings: Secondary | ICD-10-CM | POA: Diagnosis not present

## 2023-08-17 DIAGNOSIS — Z6826 Body mass index (BMI) 26.0-26.9, adult: Secondary | ICD-10-CM | POA: Diagnosis not present

## 2023-08-17 LAB — HM MAMMOGRAPHY

## 2023-08-18 ENCOUNTER — Other Ambulatory Visit (HOSPITAL_COMMUNITY): Payer: Self-pay | Admitting: Interventional Radiology

## 2023-08-18 ENCOUNTER — Other Ambulatory Visit: Payer: Self-pay

## 2023-08-18 ENCOUNTER — Ambulatory Visit (HOSPITAL_COMMUNITY)
Admission: RE | Admit: 2023-08-18 | Discharge: 2023-08-18 | Disposition: A | Discharge status: Home / Self Care | Payer: BC Managed Care – PPO | Source: Ambulatory Visit | Attending: Interventional Radiology | Admitting: Interventional Radiology

## 2023-08-18 ENCOUNTER — Encounter (HOSPITAL_COMMUNITY): Payer: Self-pay

## 2023-08-18 DIAGNOSIS — I701 Atherosclerosis of renal artery: Secondary | ICD-10-CM

## 2023-08-18 DIAGNOSIS — Z87891 Personal history of nicotine dependence: Secondary | ICD-10-CM | POA: Diagnosis not present

## 2023-08-18 DIAGNOSIS — I10 Essential (primary) hypertension: Secondary | ICD-10-CM | POA: Diagnosis not present

## 2023-08-18 HISTORY — PX: IR RENAL BILAT S&I MOD SED: IMG656

## 2023-08-18 HISTORY — PX: IR US GUIDE VASC ACCESS RIGHT: IMG2390

## 2023-08-18 LAB — BASIC METABOLIC PANEL
Anion gap: 9 (ref 5–15)
BUN: 47 mg/dL — ABNORMAL HIGH (ref 8–23)
CO2: 22 mmol/L (ref 22–32)
Calcium: 9.8 mg/dL (ref 8.9–10.3)
Chloride: 107 mmol/L (ref 98–111)
Creatinine, Ser: 2.27 mg/dL — ABNORMAL HIGH (ref 0.44–1.00)
GFR, Estimated: 23 mL/min — ABNORMAL LOW (ref >60)
Glucose, Bld: 111 mg/dL — ABNORMAL HIGH (ref 70–99)
Potassium: 5.1 mmol/L (ref 3.5–5.1)
Sodium: 138 mmol/L (ref 135–145)

## 2023-08-18 LAB — CBC
HCT: 34 % — ABNORMAL LOW (ref 36.0–46.0)
Hemoglobin: 10.5 g/dL — ABNORMAL LOW (ref 12.0–15.0)
MCH: 30.3 pg (ref 26.0–34.0)
MCHC: 30.9 g/dL (ref 30.0–36.0)
MCV: 98 fL (ref 80.0–100.0)
Platelets: 263 10*3/uL (ref 150–400)
RBC: 3.47 MIL/uL — ABNORMAL LOW (ref 3.87–5.11)
RDW: 12.9 % (ref 11.5–15.5)
WBC: 5 10*3/uL (ref 4.0–10.5)
nRBC: 0 % (ref 0.0–0.2)

## 2023-08-18 LAB — PROTIME-INR
INR: 1 (ref 0.8–1.2)
Prothrombin Time: 13.1 s (ref 11.4–15.2)

## 2023-08-18 MED ORDER — LACTATED RINGERS IV SOLN: INTRAVENOUS | Status: DC

## 2023-08-18 MED ORDER — MIDAZOLAM HCL 2 MG/2ML IJ SOLN
INTRAMUSCULAR | Status: AC
  Filled 2023-08-18: qty 2

## 2023-08-18 MED ORDER — FENTANYL CITRATE (PF) 100 MCG/2ML IJ SOLN
INTRAMUSCULAR | Status: AC
  Filled 2023-08-18: qty 2

## 2023-08-18 MED ORDER — MIDAZOLAM HCL 2 MG/2ML IJ SOLN
INTRAMUSCULAR | Status: AC | PRN
  Administered 2023-08-18: 1 mg via INTRAVENOUS

## 2023-08-18 MED ORDER — LIDOCAINE HCL 1 % IJ SOLN
INTRAMUSCULAR | Status: AC
  Filled 2023-08-18: qty 20

## 2023-08-18 MED ORDER — HEPARIN SODIUM (PORCINE) 1000 UNIT/ML IJ SOLN
INTRAMUSCULAR | Status: AC
  Filled 2023-08-18: qty 10

## 2023-08-18 MED ORDER — IOHEXOL 300 MG/ML  SOLN: 100.0000 mL | Freq: Once | INTRAMUSCULAR | Status: DC | PRN

## 2023-08-18 MED ORDER — SODIUM CHLORIDE 0.9% FLUSH: 10.0000 mL | Freq: Two times a day (BID) | INTRAVENOUS | Status: DC

## 2023-08-18 MED ORDER — FENTANYL CITRATE (PF) 100 MCG/2ML IJ SOLN
INTRAMUSCULAR | Status: AC | PRN
  Administered 2023-08-18: 50 ug via INTRAVENOUS

## 2023-08-18 MED ORDER — HEPARIN SODIUM (PORCINE) 1000 UNIT/ML IJ SOLN
INTRAMUSCULAR | Status: AC | PRN
  Administered 2023-08-18: 5000 [IU] via INTRAVENOUS

## 2023-08-18 MED ORDER — LIDOCAINE HCL 1 % IJ SOLN
20.0000 mL | Freq: Once | INTRAMUSCULAR | Status: AC
  Administered 2023-08-18: 10 mL via INTRADERMAL

## 2023-08-18 MED ORDER — VITAMIN C 500 MG PO TABS
1000.0000 mg | ORAL_TABLET | Freq: Once | ORAL | Status: AC
  Administered 2023-08-18: 1000 mg via ORAL
  Filled 2023-08-18: qty 2

## 2023-08-18 MED ORDER — ASPIRIN 325 MG PO TABS
650.0000 mg | ORAL_TABLET | Freq: Once | ORAL | Status: AC
  Administered 2023-08-18: 650 mg via ORAL
  Filled 2023-08-18: qty 2

## 2023-08-18 MED ORDER — CLOPIDOGREL BISULFATE 75 MG PO TABS
300.0000 mg | ORAL_TABLET | Freq: Once | ORAL | Status: AC
  Administered 2023-08-18: 300 mg via ORAL
  Filled 2023-08-18: qty 4

## 2023-08-18 NOTE — Procedures (Signed)
Interventional Radiology Procedure Note  Procedure:  US guided right CFA access Aortogram, with catheter selection of bilateral renal artery, CO2 Bilateral renal artery angiogram, CO2 Pressure measurements of bilateral renal artery CELT closure  Findings: - Aorta pressures 160/57 (97)  - Right renal artery pressure 150/58 (90).  No significant gradient - Left renal artery pressure 160/61 (96). No significant gradient  Complications: None  Recommendations:  - 1 hr recovery for sedation with right hip straight.  CELT has indication for immediate ambulation, leg straight time during sedation recovery - routine wound care - 1 hr dc home when goals met - Continue medical care, with single anti-platelet and anti-hypertensives - Do not submerge for 7 days - follow up visit with Dr. Loreta Ave in ~4 weeks for post  Signed,  Yvone Neu. Loreta Ave, DO, ABVM, RPVI

## 2023-08-18 NOTE — Progress Notes (Signed)
Patient was given discharge instructions. She verbalized understanding.

## 2023-08-18 NOTE — Sedation Documentation (Signed)
Aorta pressure 160/57 (97) Right renal pressure 150/58 (90) Left renal pressure 160/61 (96)

## 2023-08-18 NOTE — H&P (Signed)
Chief Complaint: Patient was seen in consultation today for Renal arteriogram with possible intervention at the request of Dr Gwenith Spitz; Dr Alphonsus Sias   Supervising Physician: Gilmer Mor  Patient Status: [facility] - Out-pt  History of Present Illness: Sheri [name] is a 72 y.o. female   FULL Code status per Pt  Hx HTN Persisting HTN--taking several meds and followed by PCP and Nephrology -- referred to Dr Loreta Ave fro evaluation of possible renovascular HTN and intervention  MR 12/11/22:  IMPRESSION: MR angiogram suggests 50% or greater narrowing after the origin of the right renal artery with poststenotic dilation in the mid segment. Findings can be seen with either atypical distribution of atherosclerosis or fibromuscular dysplasia. As noted on the duplex CT angiogram has higher spatial resolution, though requires IV contrast load, and alternatively a formal CO2 angiogram with pressure measurements may also be considered. No MR evidence of a left renal artery stenosis.  Was seen in consultation 02/12/23 with Dr Loreta Ave I spoke to her today regarding the condition of HTN, generally that the diagnosis is >90% related to essential hypertension, but sometimes renovascular HTN.  We discussed general role of the kidney with blood pressure, and also the risk of uncontrolled HTN; namely stroke risk and risk of cardiorenal syndrome/cardiac events.  We further discussed the role for angiogram which would be to confirm or rule-out a high grade stenosis, and that we would treat any high-grade stenosis discovered with PTA/stenting.   Regarding endovascular options, specific risks discussed include: bleeding, infection, contrast reaction, renal injury/nephropathy, arterial injury/dissection, need for additional procedure/surgery, worsening symptoms/organ injury, cardiopulmonary collapse, death.   She understands, and voices that she would like to have the angiogram.  Her major obstacle is her new  employment.   Plan: - Angiogram and possible RAS would be at [facility] with Dr. Loreta Ave - Continue maximal medical therapy   Scheduled today for procedure   Past Medical History:  Diagnosis Date   Allergy    GERD (gastroesophageal reflux disease)    Hypertension    IBS (irritable bowel syndrome)    Nocturia    Personal history of colonic adenomas 10/09/2013   Tubular adenoma    Wrist fracture, left 09/21/2012    Past Surgical History:  Procedure Laterality Date   ABDOMINAL HYSTERECTOMY     CARPAL TUNNEL RELEASE  1982   rt   CARPAL TUNNEL RELEASE Right 09/12/2013   Procedure: RIGHT CARPAL TUNNEL RELEASE;  Surgeon: Wyn Forster., MD;  Location: [facility];  Service: Orthopedics;  Laterality: Right;   COLONOSCOPY  2015, 2018   DORSAL COMPARTMENT RELEASE Left 03/16/2013   Procedure: LEFT FIRST RELEASE DORSAL COMPARTMENT (DEQUERVAIN) EXCISION OF CYST  A1 PULLEY LEFT THUMB;  Surgeon: Wyn Forster., MD;  Location: [facility];  Service: Orthopedics;  Laterality: Left;   ESOPHAGOGASTRODUODENOSCOPY (EGD) WITH ESOPHAGEAL DILATION  03/2017   ring   IR RADIOLOGIST EVAL & MGMT  02/12/2023   TONSILLECTOMY     TUBAL LIGATION      Allergies: Codeine and Statins  Medications: Prior to Admission medications   Medication Sig Start Date End Date Taking? Authorizing Provider  amLODipine (NORVASC) 5 MG tablet Take 1 tablet (5 mg total) by mouth daily. 05/27/21  Yes Karie Schwalbe, MD  aspirin 81 MG tablet Take 81 mg by mouth daily.   Yes [provider]  BIOTIN PO Take by mouth.   Yes [provider]  chlorthalidone (HYGROTON) 25 MG tablet Take  1 tablet (25 mg total) by mouth daily. 05/20/23  Yes Karie Schwalbe, MD  GARLIC PO Take by mouth.   Yes [provider]  metoprolol succinate (TOPROL-XL) 25 MG 24 hr tablet Take 25 mg by mouth daily.   Yes [provider]  Multiple Vitamins-Minerals (ZINC PO) Take by mouth.   Yes  [provider]  olmesartan (BENICAR) 40 MG tablet TAKE 1 TABLET(40 MG) BY MOUTH DAILY 08/17/22  Yes Tillman Abide I, MD  omeprazole (PRILOSEC) 20 MG capsule TAKE 1 CAPSULE(20 MG) BY MOUTH DAILY 06/14/23  Yes Karie Schwalbe, MD  traMADol (ULTRAM) 50 MG tablet Take 1 tablet (50 mg total) by mouth 3 (three) times daily as needed. 07/28/23  Yes Karie Schwalbe, MD  cyclobenzaprine (FLEXERIL) 10 MG tablet TAKE 1/2 TO 1 TABLET(5 TO 10 MG) BY MOUTH AT BEDTIME AS NEEDED FOR MUSCLE SPASMS 07/14/23   Karie Schwalbe, MD  OVER THE COUNTER MEDICATION Super Beets Heart Chews    [provider]     Family History  Problem Relation Age of Onset   Dementia Mother        Lewy body   Hypertension Sister    Cancer Sister        breast   Colon cancer Neg Hx    Esophageal cancer Neg Hx    Rectal cancer Neg Hx    Stomach cancer Neg Hx    Heart disease Neg Hx     Social History   Socioeconomic History   Marital status: Divorced    Spouse name: Not on file   Number of children: 2   Years of education: 13   Highest education level: Not on file  Occupational History   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 Married '73 - 18 yr/divorced. 1 dtr - '82, 1 son - '73; 1 grandchild.    Lives in her own place         No formal living will   Son, then daughter, should make health care POA   Would accept resuscitation   Not sure about tube feeds--but wouldn't want if cognitively unaware   Social Determinants of Health   Financial Resource Strain: Not on file  Food Insecurity: Not  on file  Transportation Needs: Not on file  Physical Activity: Not on file  Stress: Not on file (07/29/2023)  Social Connections: Unknown (01/30/2022)   Received from [facility]   Social Network    Social Network: Not on file    Review of Systems: A 12 point ROS discussed and pertinent positives are indicated in the HPI above.  All other systems are negative.  Review of Systems  Constitutional:  Negative for activity change, fatigue and fever.  Respiratory:  Negative for cough and shortness of breath.   Cardiovascular:  Negative for chest pain.  Gastrointestinal:  Negative for abdominal pain and nausea.  Psychiatric/Behavioral:  Negative for behavioral problems and confusion.     Vital Signs:  BP (!) 169/70   Pulse 77   Temp (!) 97.3 F (36.3 C) (Temporal)   Resp 18   Ht 5' 6.5" (1.689 m)   Wt 169 lb (76.7 kg)   SpO2 100%   BMI 26.87 kg/m   Advance Care Plan: The advanced care plan/surrogate decision maker was discussed at the time of visit and documented in the medical record.    Physical Exam Vitals reviewed.  HENT:     Mouth/Throat:     Mouth: Mucous membranes are moist.  Cardiovascular:     Rate and Rhythm: Normal rate and regular rhythm.     Heart sounds: Normal heart sounds.  Pulmonary:     Effort: Pulmonary effort is normal.     Breath sounds: Normal breath sounds. No wheezing.  Abdominal:     Palpations: Abdomen is soft.     Tenderness: There is no abdominal tenderness.  Musculoskeletal:        General: Normal range of motion.  Skin:    General: Skin is warm.  Neurological:     Mental Status: She is alert and oriented to person, place, and time.  Psychiatric:        Behavior: Behavior normal.     Imaging: No results found.  Labs:  CBC: No results for input(s): "WBC", "HGB", "HCT", "PLT" in the last 8760 hours.  COAGS: Recent Labs    08/18/23 0758  INR 1.0    BMP: No results for input(s): "NA", "K", "CL", "CO2",  "GLUCOSE", "BUN", "CALCIUM", "CREATININE", "GFRNONAA", "GFRAA" in the last 8760 hours.  Invalid input(s): "CMP"  LIVER FUNCTION TESTS: No results for input(s): "BILITOT", "AST", "ALT", "ALKPHOS", "PROT", "ALBUMIN" in the last 8760 hours.  TUMOR MARKERS: No results for input(s): "AFPTM", "CEA", "CA199", "CHROMGRNA" in the last 8760 hours.  Assessment and Plan:  Scheduled for Renal Arteriogram with possible Intervention Risks and benefits of Renal arteriogram and possible intervention were discussed with the patient including, but not limited to bleeding, infection, vascular injury or contrast induced renal failure.  This interventional procedure involves the use of X-rays and because of the nature of the planned procedure, it is possible that we will have prolonged use of X-ray fluoroscopy.  Potential radiation risks to you include (but are not limited to) the following: - A slightly elevated risk for cancer  several years later in life. This risk is typically less than 0.5% percent. This risk is low in comparison to the normal incidence of human cancer, which is 33% for women and 50% for men according to the American Cancer Society. - Radiation induced injury can include skin redness, resembling a rash, tissue breakdown / ulcers and hair loss (which can be temporary or permanent).   The likelihood of either of these occurring depends on the difficulty of the procedure and whether you are sensitive to radiation due to previous procedures, disease, or genetic conditions.   IF your procedure requires a prolonged use of radiation, you will be notified and given written instructions for further action.  It is your responsibility to monitor the irradiated area for the 2 weeks following the procedure and to notify your physician if you are concerned that you have suffered a radiation induced injury.    All of the patient's questions were answered, patient is agreeable to proceed.  Consent  signed and in chart.  Thank you for this interesting consult.  I greatly enjoyed meeting Deanie Faries and look forward to participating in their care.  A copy of  this report was sent to the requesting provider on this date.  Electronically Signed: Robet Leu, PA-C 08/18/2023, 8:24 AM   I spent a total of  30 Minutes   in face to face in clinical consultation, greater than 50% of which was counseling/coordinating care for renal arteriogram with poss intervention

## 2023-08-26 ENCOUNTER — Other Ambulatory Visit: Payer: Self-pay | Admitting: Internal Medicine

## 2023-08-30 ENCOUNTER — Other Ambulatory Visit: Payer: Self-pay | Admitting: Interventional Radiology

## 2023-08-30 DIAGNOSIS — I701 Atherosclerosis of renal artery: Secondary | ICD-10-CM

## 2023-09-23 ENCOUNTER — Other Ambulatory Visit: Payer: Self-pay | Admitting: Internal Medicine

## 2023-09-24 ENCOUNTER — Other Ambulatory Visit: Payer: Self-pay | Admitting: Internal Medicine

## 2023-09-24 DIAGNOSIS — H40013 Open angle with borderline findings, low risk, bilateral: Secondary | ICD-10-CM | POA: Diagnosis not present

## 2023-09-24 NOTE — Telephone Encounter (Signed)
 Last filled 08-26-23 #90 Last OV 05-20-23 No Future OV [facility] [address]

## 2023-10-01 DIAGNOSIS — N1832 Chronic kidney disease, stage 3b: Secondary | ICD-10-CM | POA: Diagnosis not present

## 2023-10-20 ENCOUNTER — Encounter: Payer: Self-pay | Admitting: Internal Medicine

## 2023-10-20 ENCOUNTER — Other Ambulatory Visit: Payer: Self-pay | Admitting: Internal Medicine

## 2023-10-22 ENCOUNTER — Other Ambulatory Visit: Payer: Self-pay | Admitting: Internal Medicine

## 2023-10-22 NOTE — Telephone Encounter (Signed)
Last filled 09-24-23 #90 Last OV 05-20-23 Next OV 10-25-23 [facility] [address] [facility] [address]

## 2023-10-25 ENCOUNTER — Telehealth: Payer: Self-pay

## 2023-10-25 ENCOUNTER — Ambulatory Visit (INDEPENDENT_AMBULATORY_CARE_PROVIDER_SITE_OTHER): Payer: Medicare HMO | Admitting: Internal Medicine

## 2023-10-25 ENCOUNTER — Encounter: Payer: Self-pay | Admitting: Internal Medicine

## 2023-10-25 VITALS — BP 172/72 | HR 90 | Temp 99.4°F | Wt 176.0 lb

## 2023-10-25 DIAGNOSIS — I1A0 Resistant hypertension: Secondary | ICD-10-CM | POA: Diagnosis not present

## 2023-10-25 DIAGNOSIS — N1832 Chronic kidney disease, stage 3b: Secondary | ICD-10-CM | POA: Diagnosis not present

## 2023-10-25 DIAGNOSIS — E875 Hyperkalemia: Secondary | ICD-10-CM | POA: Diagnosis not present

## 2023-10-25 DIAGNOSIS — F1120 Opioid dependence, uncomplicated: Secondary | ICD-10-CM | POA: Diagnosis not present

## 2023-10-25 DIAGNOSIS — M797 Fibromyalgia: Secondary | ICD-10-CM

## 2023-10-25 DIAGNOSIS — N184 Chronic kidney disease, stage 4 (severe): Secondary | ICD-10-CM | POA: Diagnosis not present

## 2023-10-25 DIAGNOSIS — K589 Irritable bowel syndrome without diarrhea: Secondary | ICD-10-CM | POA: Diagnosis not present

## 2023-10-25 NOTE — Progress Notes (Signed)
Subjective:    Patient ID: Sheri [name], female    DOB: 1951-07-27, 73 y.o.   MRN: [id number]  HPI Here for follow up of resistant HTN and chronic pain ( and CKD)  Upset at herself---has been eating tater tots at work [employer]  Recent blood work by Dr Valentino Nose (last month) Had been down when checked at procedure visit----renal angiograms No stent was put in though No chest pain or SOB GFR still in 20's  Still having sleep problems Feels tired all the time Nocturia x 2---has cut back on fluid intake in evening (used to be 4 times per night)  Tries to avoid the tramadol close to bedtime also  Current Outpatient Medications on File Prior to Visit  Medication Sig Dispense Refill   amLODipine (NORVASC) 5 MG tablet Take 1 tablet (5 mg total) by mouth daily. 90 tablet 3   aspirin 81 MG tablet Take 81 mg by mouth daily.     BIOTIN PO Take by mouth.     chlorthalidone (HYGROTON) 25 MG tablet Take 1 tablet (25 mg total) by mouth daily. 90 tablet 3   cyclobenzaprine (FLEXERIL) 10 MG tablet TAKE 1/2 TO 1 TABLET(5 TO 10 MG) BY MOUTH AT BEDTIME AS NEEDED FOR MUSCLE SPASMS 30 tablet 1   Ferrous Sulfate (IRON PO) Take by mouth.     GARLIC PO Take by mouth.     metoprolol succinate (TOPROL-XL) 25 MG 24 hr tablet Take 25 mg by mouth daily.     Multiple Vitamins-Minerals (ZINC PO) Take by mouth.     olmesartan (BENICAR) 40 MG tablet TAKE 1 TABLET(40 MG) BY MOUTH DAILY 90 tablet 3   omeprazole (PRILOSEC) 20 MG capsule TAKE 1 CAPSULE(20 MG) BY MOUTH DAILY 90 capsule 3   OVER THE COUNTER MEDICATION Super Beets Heart Chews     traMADol (ULTRAM) 50 MG tablet Take 1 tablet (50 mg total) by mouth 3 (three) times daily as needed. 90 tablet 0   No current facility-administered medications on file prior to visit.    Allergies  Allergen Reactions   Codeine Nausea And Vomiting   Statins Other (See Comments)    Caused thinning hair/hair to fall out    Past Medical History:  Diagnosis  Date   Allergy    GERD (gastroesophageal reflux disease)    Hypertension    IBS (irritable bowel syndrome)    Nocturia    Personal history of colonic adenomas 10/09/2013   Tubular adenoma    Wrist fracture, left 09/21/2012    Past Surgical History:  Procedure Laterality Date   ABDOMINAL HYSTERECTOMY     CARPAL TUNNEL RELEASE  1982   rt   CARPAL TUNNEL RELEASE Right 09/12/2013   Procedure: RIGHT CARPAL TUNNEL RELEASE;  Surgeon: Wyn Forster., MD;  Location: [facility];  Service: Orthopedics;  Laterality: Right;   COLONOSCOPY  2015, 2018   DORSAL COMPARTMENT RELEASE Left 03/16/2013   Procedure: LEFT FIRST RELEASE DORSAL COMPARTMENT (DEQUERVAIN) EXCISION OF CYST  A1 PULLEY LEFT THUMB;  Surgeon: Wyn Forster., MD;  Location: [facility];  Service: Orthopedics;  Laterality: Left;   ESOPHAGOGASTRODUODENOSCOPY (EGD) WITH ESOPHAGEAL DILATION  03/2017   ring   IR RADIOLOGIST EVAL & MGMT  02/12/2023   IR RENAL BILAT S&I MOD SED  08/18/2023   IR US GUIDE VASC ACCESS RIGHT  08/18/2023   TONSILLECTOMY     TUBAL LIGATION      Family History  Problem Relation Age of Onset   Dementia Mother        Lewy body   Hypertension Sister    Cancer Sister        breast   Colon cancer Neg Hx    Esophageal cancer Neg Hx    Rectal cancer Neg Hx    Stomach cancer Neg Hx    Heart disease Neg Hx     Social History   Socioeconomic History   Marital status: Divorced    Spouse name: Not on file   Number of children: 2   Years of education: 13   Highest education level: Not on file  Occupational History   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 Married '73 - 18 yr/divorced. 1 dtr - '82, 1 son - '73; 1 grandchild.    Lives in her own place         No formal living will   Son, then daughter, should make health care POA   Would accept resuscitation   Not sure about tube feeds--but wouldn't want if cognitively unaware   Social Drivers of Health   Financial Resource Strain: Not on file  Food Insecurity: Not on file  Transportation Needs: Not on file  Physical Activity: Not on file  Stress: Not on file (07/29/2023)  Social Connections: Unknown (01/30/2022)   Received from [facility]   Social Network    Social Network: Not on file  Intimate Partner Violence: Unknown (12/22/2021)   Received from [facility]   HITS    Physically Hurt: Not on file    Insult or Talk Down To: Not on file    Threaten Physical Harm: Not on file    Scream or Curse: Not on file   Review of Systems No dizziness or syncope Bowels move okay---uses meds to avoid incontinence (like pepto or gas med) Has tried hydroxycut to lose weight--discussed potential liver issues with this    Objective:   Physical Exam Constitutional:      Appearance: Normal appearance.  Cardiovascular:     Rate and Rhythm: Normal rate and regular rhythm.     Heart sounds: No murmur heard.    No gallop.  Pulmonary:     Effort: Pulmonary effort is normal.     Breath sounds: Normal breath sounds. No wheezing or rales.  Musculoskeletal:     Cervical back: Neck supple.     Right lower leg: No edema.     Left lower leg: No edema.  Lymphadenopathy:     Cervical: No cervical adenopathy.  Neurological:     Mental Status: She is alert.  Psychiatric:        Mood and Affect: Mood normal.        Behavior: Behavior normal.            Assessment & Plan:

## 2023-10-25 NOTE — Assessment & Plan Note (Signed)
BP Readings from Last 3 Encounters:  10/25/23 (!) 172/72  08/18/23 (!) 122/47  05/20/23 (!) 144/72   Did not have renal artery stenosis Is on maximal olmesartan, metoprolol 25 daily, chlorthalidone 25 and amlodipine 5mg  Going to Dr Valentino Nose today---if he doesn't change anything, I would increase the amlodipine

## 2023-10-25 NOTE — Assessment & Plan Note (Signed)
Going to nephrologist  today--will get repeat labs

## 2023-10-25 NOTE — Assessment & Plan Note (Signed)
PDMP reviewed --no concerns 

## 2023-10-25 NOTE — Assessment & Plan Note (Signed)
Uses the tramadol 3 times per day generally

## 2023-10-25 NOTE — Telephone Encounter (Signed)
Copied from CRM [id number]. Topic: General - Other >> Oct 25, 2023  2:45 PM Rodman Pickle T wrote: Reason for CRM: PATIENT CALLED BACK IN AND STATED DR [facility] PRESCRIBED HER ANOTHER MEDICATION     SPIRONOLACTONE

## 2023-10-26 NOTE — Telephone Encounter (Signed)
Left message on VM per DPR with Dr Alla German message

## 2023-10-26 NOTE — Telephone Encounter (Signed)
Tried to call pt but seems that phone is messed up. Did not ring and VM did not come on.

## 2023-11-19 DIAGNOSIS — N1832 Chronic kidney disease, stage 3b: Secondary | ICD-10-CM | POA: Diagnosis not present

## 2023-11-22 ENCOUNTER — Other Ambulatory Visit: Payer: Self-pay | Admitting: Internal Medicine

## 2023-11-22 NOTE — Telephone Encounter (Signed)
 Last filled 10-22-23 #90 Last OV 10-25-23 Next OV 04-24-24 [facility] [address]

## 2023-12-17 DIAGNOSIS — N1832 Chronic kidney disease, stage 3b: Secondary | ICD-10-CM | POA: Diagnosis not present

## 2023-12-18 ENCOUNTER — Other Ambulatory Visit: Payer: Self-pay | Admitting: Internal Medicine

## 2023-12-21 ENCOUNTER — Other Ambulatory Visit: Payer: Self-pay | Admitting: Internal Medicine

## 2023-12-21 NOTE — Telephone Encounter (Signed)
 Last filled 11-22-23 #90 Last OV 10-25-23 Next OV 04-24-24 [facility] [address]

## 2024-01-21 ENCOUNTER — Other Ambulatory Visit: Payer: Self-pay | Admitting: Internal Medicine

## 2024-01-21 DIAGNOSIS — N1832 Chronic kidney disease, stage 3b: Secondary | ICD-10-CM | POA: Diagnosis not present

## 2024-01-21 NOTE — Telephone Encounter (Signed)
 Last filled 12-21-23 #90 Last OV 10-25-23 Next OV 04-24-24 [facility] [address]

## 2024-01-24 DIAGNOSIS — H35341 Macular cyst, hole, or pseudohole, right eye: Secondary | ICD-10-CM | POA: Diagnosis not present

## 2024-01-26 DIAGNOSIS — E875 Hyperkalemia: Secondary | ICD-10-CM | POA: Diagnosis not present

## 2024-01-26 DIAGNOSIS — I1A0 Resistant hypertension: Secondary | ICD-10-CM | POA: Diagnosis not present

## 2024-01-26 DIAGNOSIS — H43821 Vitreomacular adhesion, right eye: Secondary | ICD-10-CM | POA: Diagnosis not present

## 2024-01-26 DIAGNOSIS — H2513 Age-related nuclear cataract, bilateral: Secondary | ICD-10-CM | POA: Diagnosis not present

## 2024-01-26 DIAGNOSIS — H43822 Vitreomacular adhesion, left eye: Secondary | ICD-10-CM | POA: Diagnosis not present

## 2024-01-26 DIAGNOSIS — N1832 Chronic kidney disease, stage 3b: Secondary | ICD-10-CM | POA: Diagnosis not present

## 2024-01-26 DIAGNOSIS — H35341 Macular cyst, hole, or pseudohole, right eye: Secondary | ICD-10-CM | POA: Diagnosis not present

## 2024-01-26 DIAGNOSIS — D649 Anemia, unspecified: Secondary | ICD-10-CM | POA: Diagnosis not present

## 2024-01-26 DIAGNOSIS — E663 Overweight: Secondary | ICD-10-CM | POA: Diagnosis not present

## 2024-02-15 DIAGNOSIS — H33311 Horseshoe tear of retina without detachment, right eye: Secondary | ICD-10-CM | POA: Diagnosis not present

## 2024-02-15 DIAGNOSIS — H35341 Macular cyst, hole, or pseudohole, right eye: Secondary | ICD-10-CM | POA: Diagnosis not present

## 2024-02-15 HISTORY — PX: PARS PLANA VITRECTOMY W/ REPAIR OF MACULAR HOLE: SHX2170

## 2024-02-18 ENCOUNTER — Other Ambulatory Visit: Payer: Self-pay | Admitting: Internal Medicine

## 2024-02-18 NOTE — Telephone Encounter (Signed)
 Last filled 01-21-24 #90 Last OV 10-25-23 Next OV 04-24-24 [facility] [address]

## 2024-03-21 ENCOUNTER — Other Ambulatory Visit: Payer: Self-pay | Admitting: Internal Medicine

## 2024-03-21 NOTE — Telephone Encounter (Signed)
 Last filled 02-18-24 #90 Last OV 10-25-23 Next OV 04-24-24 [facility] [address]

## 2024-03-30 DIAGNOSIS — H35341 Macular cyst, hole, or pseudohole, right eye: Secondary | ICD-10-CM | POA: Diagnosis not present

## 2024-04-20 ENCOUNTER — Other Ambulatory Visit: Payer: Self-pay | Admitting: Internal Medicine

## 2024-04-20 NOTE — Telephone Encounter (Signed)
 Last filled 03-21-24 #90 Last OV 10-25-23 Next OV 04-24-24 [facility] [address]

## 2024-04-21 DIAGNOSIS — N1832 Chronic kidney disease, stage 3b: Secondary | ICD-10-CM | POA: Diagnosis not present

## 2024-04-24 ENCOUNTER — Encounter: Payer: Medicare HMO | Admitting: Internal Medicine

## 2024-04-25 ENCOUNTER — Encounter: Payer: Self-pay | Admitting: Internal Medicine

## 2024-04-25 ENCOUNTER — Ambulatory Visit (INDEPENDENT_AMBULATORY_CARE_PROVIDER_SITE_OTHER): Admitting: Internal Medicine

## 2024-04-25 VITALS — BP 122/60 | HR 69 | Temp 98.5°F | Ht 66.5 in | Wt 173.0 lb

## 2024-04-25 DIAGNOSIS — N1832 Chronic kidney disease, stage 3b: Secondary | ICD-10-CM | POA: Diagnosis not present

## 2024-04-25 DIAGNOSIS — K219 Gastro-esophageal reflux disease without esophagitis: Secondary | ICD-10-CM | POA: Diagnosis not present

## 2024-04-25 DIAGNOSIS — I1A0 Resistant hypertension: Secondary | ICD-10-CM

## 2024-04-25 DIAGNOSIS — F1120 Opioid dependence, uncomplicated: Secondary | ICD-10-CM | POA: Diagnosis not present

## 2024-04-25 DIAGNOSIS — N184 Chronic kidney disease, stage 4 (severe): Secondary | ICD-10-CM

## 2024-04-25 DIAGNOSIS — M797 Fibromyalgia: Secondary | ICD-10-CM

## 2024-04-25 DIAGNOSIS — E875 Hyperkalemia: Secondary | ICD-10-CM | POA: Diagnosis not present

## 2024-04-25 DIAGNOSIS — K589 Irritable bowel syndrome without diarrhea: Secondary | ICD-10-CM | POA: Diagnosis not present

## 2024-04-25 DIAGNOSIS — D649 Anemia, unspecified: Secondary | ICD-10-CM | POA: Diagnosis not present

## 2024-04-25 NOTE — Progress Notes (Signed)
 Subjective:    Patient ID: Sheri [name], female    DOB: 1951/06/05, 73 y.o.   MRN: [id number]  HPI Here for follow up of HTN, chronic pain and other medical conditions  Did see Dr Macel in May Started metoprolol  25 again --but then stopped due to hair loss Amlodipine  is 10mg  Didn't tolerate spironolactone for itching---but she went back on this No chest pain or SOB No dizziness or syncope  Tramadol  for the general body pain--from fibromyalgia Has daily exercises and conditioning class at work [employer])  Had repair of macular hole in May Now will need cataract repair  Work is okay  Current Outpatient Medications on File Prior to Visit  Medication Sig Dispense Refill   amLODipine  (NORVASC ) 10 MG tablet Take 10 mg by mouth daily.     aspirin  81 MG tablet Take 81 mg by mouth daily.     BIOTIN PO Take by mouth.     chlorthalidone  (HYGROTON ) 25 MG tablet Take 1 tablet (25 mg total) by mouth daily. 90 tablet 3   cyclobenzaprine  (FLEXERIL ) 10 MG tablet TAKE 1/2 TO 1 TABLET(5 TO 10 MG) BY MOUTH AT BEDTIME AS NEEDED FOR MUSCLE SPASMS 30 tablet 1   Ferrous Sulfate (IRON PO) Take by mouth.     GARLIC PO Take by mouth.     metoprolol  succinate (TOPROL -XL) 25 MG 24 hr tablet Take 25 mg by mouth daily.     Multiple Vitamins-Minerals (ZINC PO) Take by mouth.     olmesartan  (BENICAR ) 40 MG tablet TAKE 1 TABLET(40 MG) BY MOUTH DAILY 90 tablet 3   omeprazole  (PRILOSEC) 20 MG capsule TAKE 1 CAPSULE(20 MG) BY MOUTH DAILY 90 capsule 3   OVER THE COUNTER MEDICATION Super Beets Heart Chews     spironolactone (ALDACTONE) 25 MG tablet Take 25 mg by mouth daily.     traMADol  (ULTRAM ) 50 MG tablet Take 1 tablet (50 mg total) by mouth 3 (three) times daily as needed. 90 tablet 0   No current facility-administered medications on file prior to visit.    Allergies  Allergen Reactions   Codeine Nausea And Vomiting   Statins Other (See Comments)    Caused thinning hair/hair to fall out    Past  Medical History:  Diagnosis Date   Allergy    GERD (gastroesophageal reflux disease)    Hypertension    IBS (irritable bowel syndrome)    Nocturia    Personal history of colonic adenomas 10/09/2013   Tubular adenoma    Wrist fracture, left 09/21/2012    Past Surgical History:  Procedure Laterality Date   ABDOMINAL HYSTERECTOMY     CARPAL TUNNEL RELEASE  1982   rt   CARPAL TUNNEL RELEASE Right 09/12/2013   Procedure: RIGHT CARPAL TUNNEL RELEASE;  Surgeon: Lamar LULLA Leonor Mickey., MD;  Location: [facility];  Service: Orthopedics;  Laterality: Right;   COLONOSCOPY  2015, 2018   DORSAL COMPARTMENT RELEASE Left 03/16/2013   Procedure: LEFT FIRST RELEASE DORSAL COMPARTMENT (DEQUERVAIN) EXCISION OF CYST  A1 PULLEY LEFT THUMB;  Surgeon: Lamar LULLA Leonor Mickey., MD;  Location: [facility];  Service: Orthopedics;  Laterality: Left;   ESOPHAGOGASTRODUODENOSCOPY (EGD) WITH ESOPHAGEAL DILATION  03/2017   ring   IR RADIOLOGIST EVAL & MGMT  02/12/2023   IR RENAL BILAT S&I MOD SED  08/18/2023   IR US  GUIDE VASC ACCESS RIGHT  08/18/2023   PARS PLANA VITRECTOMY W/ REPAIR OF MACULAR HOLE Right 02/15/2024   TONSILLECTOMY  TUBAL LIGATION      Family History  Problem Relation Age of Onset   Dementia Mother        Lewy body   Hypertension Sister    Cancer Sister        breast   Colon cancer Neg Hx    Esophageal cancer Neg Hx    Rectal cancer Neg Hx    Stomach cancer Neg Hx    Heart disease Neg Hx     Social History   Socioeconomic History   Marital status: Divorced    Spouse name: Not on file   Number of children: 2   Years of education: 13   Highest education level: Not on file  Occupational History   Occupation: [employer] work    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 Married '73 - 18 yr/divorced. 1 dtr - '82, 1 son - '73; 1 grandchild.    Lives in her own place         No formal living will   Son, then daughter, should make health care POA   Would accept resuscitation   Not sure about tube feeds--but wouldn't want if cognitively unaware   Social Drivers of Health   Financial Resource Strain: Not on file  Food Insecurity: Not on file  Transportation Needs: Not on file  Physical Activity: Not on file  Stress: Not on file (07/29/2023)  Social Connections: Unknown (01/30/2022)   Received from [facility]   Social Network    Social Network: Not on file  Intimate Partner Violence: Unknown (12/22/2021)   Received from [facility]   HITS    Physically Hurt: Not on file    Insult or Talk Down To: Not on file    Threaten Physical Harm: Not on file    Scream or Curse: Not on file   Review of Systems Chronic sleep issues--melatonin no help Appetite is okay---but fills up quickly (after a couple of bites)     Objective:   Physical Exam Constitutional:      Appearance: Normal appearance.  Cardiovascular:     Rate and Rhythm: Normal rate and regular rhythm.     Heart sounds: No murmur heard.    No gallop.  Pulmonary:     Effort: Pulmonary effort is normal.     Breath sounds: Normal breath sounds. No wheezing or rales.  Musculoskeletal:     Cervical back: Neck supple.     Right lower leg: No edema.     Left lower leg: No edema.  Lymphadenopathy:     Cervical: No cervical adenopathy.  Neurological:     Mental Status: She is alert.            Assessment & Plan:

## 2024-04-25 NOTE — Assessment & Plan Note (Signed)
 PDMP reviewed No concerns

## 2024-04-25 NOTE — Assessment & Plan Note (Signed)
 BP Readings from Last 3 Encounters:  04/25/24 122/60  10/25/23 (!) 172/72  08/18/23 (!) 122/47   Much better today Increased amlodipine  10mg  Didn't tolerate metoprolol  but is back on spironolactone 25 Olmesartan  40 and chlorthalidone  25

## 2024-04-25 NOTE — Assessment & Plan Note (Signed)
 Recent blood work and goes to Dr Macel today

## 2024-04-25 NOTE — Assessment & Plan Note (Signed)
 Chronic pain issues managed with tramadol  Still works full time, Catering manager

## 2024-05-08 DIAGNOSIS — H2513 Age-related nuclear cataract, bilateral: Secondary | ICD-10-CM | POA: Diagnosis not present

## 2024-05-08 DIAGNOSIS — H35341 Macular cyst, hole, or pseudohole, right eye: Secondary | ICD-10-CM | POA: Diagnosis not present

## 2024-05-08 DIAGNOSIS — H2511 Age-related nuclear cataract, right eye: Secondary | ICD-10-CM | POA: Diagnosis not present

## 2024-05-08 DIAGNOSIS — I10 Essential (primary) hypertension: Secondary | ICD-10-CM | POA: Diagnosis not present

## 2024-05-12 DIAGNOSIS — N1832 Chronic kidney disease, stage 3b: Secondary | ICD-10-CM | POA: Diagnosis not present

## 2024-05-19 ENCOUNTER — Other Ambulatory Visit: Payer: Self-pay | Admitting: Internal Medicine

## 2024-05-19 NOTE — Telephone Encounter (Signed)
 Last filled 03-21-24 #90 Last OV 04-25-24 No Future OV [facility] [address]

## 2024-05-31 DIAGNOSIS — N1832 Chronic kidney disease, stage 3b: Secondary | ICD-10-CM | POA: Diagnosis not present

## 2024-06-01 DIAGNOSIS — H2511 Age-related nuclear cataract, right eye: Secondary | ICD-10-CM | POA: Diagnosis not present

## 2024-06-01 DIAGNOSIS — H35341 Macular cyst, hole, or pseudohole, right eye: Secondary | ICD-10-CM | POA: Diagnosis not present

## 2024-06-15 ENCOUNTER — Other Ambulatory Visit: Payer: Self-pay

## 2024-06-15 MED ORDER — OMEPRAZOLE 20 MG PO CPDR: 20.0000 mg | DELAYED_RELEASE_CAPSULE | Freq: Every day | ORAL | 1 refills | Status: AC

## 2024-06-30 DIAGNOSIS — H2512 Age-related nuclear cataract, left eye: Secondary | ICD-10-CM | POA: Diagnosis not present

## 2024-06-30 DIAGNOSIS — H25043 Posterior subcapsular polar age-related cataract, bilateral: Secondary | ICD-10-CM | POA: Diagnosis not present

## 2024-07-04 ENCOUNTER — Ambulatory Visit

## 2024-07-20 ENCOUNTER — Other Ambulatory Visit: Payer: Self-pay | Admitting: Family

## 2024-07-20 DIAGNOSIS — Z9889 Other specified postprocedural states: Secondary | ICD-10-CM | POA: Diagnosis not present

## 2024-07-20 DIAGNOSIS — H43822 Vitreomacular adhesion, left eye: Secondary | ICD-10-CM | POA: Diagnosis not present

## 2024-07-20 DIAGNOSIS — N1832 Chronic kidney disease, stage 3b: Secondary | ICD-10-CM | POA: Diagnosis not present

## 2024-07-20 DIAGNOSIS — H35341 Macular cyst, hole, or pseudohole, right eye: Secondary | ICD-10-CM | POA: Diagnosis not present

## 2024-07-21 ENCOUNTER — Other Ambulatory Visit: Payer: Self-pay | Admitting: Internal Medicine

## 2024-07-21 MED ORDER — TRAMADOL HCL 50 MG PO TABS: 50.0000 mg | ORAL_TABLET | Freq: Three times a day (TID) | ORAL | 1 refills | Status: DC | PRN

## 2024-07-21 NOTE — Telephone Encounter (Signed)
 Pt came in and asked why has her medication refill is declined by the pharmacy and she received an email saying it was declined. Please give her a call back asap for updates concerning her medications.

## 2024-07-21 NOTE — Telephone Encounter (Signed)
 Spoke to pt. Advised her there is no request that I can see for this. Rx sent to Kenney Roys, NP

## 2024-07-26 DIAGNOSIS — H40013 Open angle with borderline findings, low risk, bilateral: Secondary | ICD-10-CM | POA: Diagnosis not present

## 2024-08-02 ENCOUNTER — Other Ambulatory Visit: Payer: Self-pay

## 2024-08-02 MED ORDER — CHLORTHALIDONE 25 MG PO TABS: 25.0000 mg | ORAL_TABLET | Freq: Every day | ORAL | 0 refills | Status: DC

## 2024-08-22 ENCOUNTER — Ambulatory Visit

## 2024-08-28 DIAGNOSIS — E875 Hyperkalemia: Secondary | ICD-10-CM | POA: Diagnosis not present

## 2024-08-28 DIAGNOSIS — K219 Gastro-esophageal reflux disease without esophagitis: Secondary | ICD-10-CM | POA: Diagnosis not present

## 2024-08-28 DIAGNOSIS — I1A0 Resistant hypertension: Secondary | ICD-10-CM | POA: Diagnosis not present

## 2024-08-28 DIAGNOSIS — N1832 Chronic kidney disease, stage 3b: Secondary | ICD-10-CM | POA: Diagnosis not present

## 2024-09-01 DIAGNOSIS — H2512 Age-related nuclear cataract, left eye: Secondary | ICD-10-CM | POA: Diagnosis not present

## 2024-09-01 DIAGNOSIS — I10 Essential (primary) hypertension: Secondary | ICD-10-CM | POA: Diagnosis not present

## 2024-09-01 DIAGNOSIS — H35341 Macular cyst, hole, or pseudohole, right eye: Secondary | ICD-10-CM | POA: Diagnosis not present

## 2024-09-01 DIAGNOSIS — Z961 Presence of intraocular lens: Secondary | ICD-10-CM | POA: Diagnosis not present

## 2024-09-22 ENCOUNTER — Other Ambulatory Visit: Payer: Self-pay | Admitting: Family

## 2024-09-22 ENCOUNTER — Other Ambulatory Visit: Payer: Self-pay | Admitting: Internal Medicine

## 2024-09-22 NOTE — Telephone Encounter (Signed)
 Copied from CRM [id number]. Topic: Clinical - Medication Refill >> Sep 22, 2024 12:18 PM Deleta S wrote: Medication: traMADol  (ULTRAM ) 50 MG tablet  Has the patient contacted their pharmacy? Yes (Agent: If no, request that the patient contact the pharmacy for the refill. If patient does not wish to contact the pharmacy document the reason why and proceed with request.) (Agent: If yes, when and what did the pharmacy advise?)  This is the patient's preferred pharmacy:  [id number] [address], [place] - [address] AT [facility] OF [facility] & [address] [place] [address] Phone: [phone] Fax: [phone]  Is this the correct pharmacy for this prescription? Yes If no, delete pharmacy and type the correct one.   Has the prescription been filled recently? Yes  Is the patient out of the medication? Yes  Has the patient been seen for an appointment in the last year OR does the patient have an upcoming appointment? Yes  Can we respond through MyChart? No  Agent: Please be advised that Rx refills may take up to 3 business days. We ask that you follow-up with your pharmacy.

## 2024-09-25 ENCOUNTER — Telehealth: Payer: Self-pay | Admitting: Family

## 2024-09-25 ENCOUNTER — Other Ambulatory Visit

## 2024-09-25 ENCOUNTER — Encounter: Payer: Self-pay | Admitting: Family

## 2024-09-25 DIAGNOSIS — Z79899 Other long term (current) drug therapy: Secondary | ICD-10-CM

## 2024-09-25 DIAGNOSIS — F1120 Opioid dependence, uncomplicated: Secondary | ICD-10-CM

## 2024-09-25 DIAGNOSIS — M797 Fibromyalgia: Secondary | ICD-10-CM

## 2024-09-25 NOTE — Telephone Encounter (Signed)
 I will have this patient as a TOC next month  I do not prescribe chronic pain medication  With her kidney disease, we actually need to decrease to twice daily dosing as needed   I have referred her to pain management. I will send her in a 30 day refill but she will need to see them for future refills.   Also if she could stop by the office and leave us  a urine drug screen she has not had one in over one year.   We will need this for refill.

## 2024-09-25 NOTE — Telephone Encounter (Signed)
 Copied from CRM [id number]. Topic: Clinical - Medication Refill >> Sep 22, 2024 12:18 PM Deleta S wrote: Medication: traMADol  (ULTRAM ) 50 MG tablet  Has the patient contacted their pharmacy? Yes (Agent: If no, request that the patient contact the pharmacy for the refill. If patient does not wish to contact the pharmacy document the reason why and proceed with request.) (Agent: If yes, when and what did the pharmacy advise?)  This is the patient's preferred pharmacy:  [id number] [place], [place] - [address] AT [facility] OF [facility] & [address] [place] [address] Phone: [phone] Fax: [phone]  Is this the correct pharmacy for this prescription? Yes If no, delete pharmacy and type the correct one.   Has the prescription been filled recently? Yes  Is the patient out of the medication? Yes  Has the patient been seen for an appointment in the last year OR does the patient have an upcoming appointment? Yes  Can we respond through MyChart? No  Agent: Please be advised that Rx refills may take up to 3 business days. We ask that you follow-up with your pharmacy. >> Sep 25, 2024  8:03 AM Pinkey LELON wrote: Patient is calling this morning, wanting an status update on her medication. Patient states she has 2 left and would really like to be able to pick up her medication today.

## 2024-09-25 NOTE — Telephone Encounter (Signed)
 Spoke with pt and she is aware of Tabitha's message. Lab only appt has been scheduled.

## 2024-09-25 NOTE — Telephone Encounter (Signed)
 LM for pt to return my call

## 2024-09-26 LAB — DRUG PROFILE, UR, 9 DRUGS (LABCORP)
Amphetamines, Urine: NEGATIVE ng/mL
Barbiturate Quant, Ur: NEGATIVE ng/mL
Benzodiazepine Quant, Ur: NEGATIVE ng/mL
Cannabinoid Quant, Ur: NEGATIVE ng/mL
Cocaine (Metab.): NEGATIVE ng/mL
Creatinine, Urine: 145.5 mg/dL (ref 20.0–300.0)
Methadone Screen, Urine: NEGATIVE ng/mL
Nitrite Urine, Quantitative: NEGATIVE ug/mL
OPIATE SCREEN URINE: NEGATIVE ng/mL
PCP Quant, Ur: NEGATIVE ng/mL
Propoxyphene: NEGATIVE ng/mL
pH, Urine: 5.2 (ref 4.5–8.9)

## 2024-09-27 ENCOUNTER — Ambulatory Visit: Payer: Self-pay | Admitting: Family

## 2024-09-28 ENCOUNTER — Telehealth: Payer: Self-pay | Admitting: Family

## 2024-09-28 NOTE — Telephone Encounter (Signed)
 Copied from CRM [id number]. Topic: Clinical - Medication Refill >> Sep 28, 2024  3:06 PM Alfonso HERO wrote: Medication: traMADol  (ULTRAM ) 50 MG tablet  Has the patient contacted their pharmacy? Yes (Agent: If no, request that the patient contact the pharmacy for the refill. If patient does not wish to contact the pharmacy document the reason why and proceed with request.) (Agent: If yes, when and what did the pharmacy advise?)  This is the patient's preferred pharmacy:  [id number] [address], [place] - [address] AT [facility] OF [facility] & [address] [place] [address] Phone: [phone] Fax: [phone]  Is this the correct pharmacy for this prescription? Yes If no, delete pharmacy and type the correct one.   Has the prescription been filled recently? Yes  Is the patient out of the medication? Yes  Has the patient been seen for an appointment in the last year OR does the patient have an upcoming appointment? Yes  Can we respond through MyChart? Yes  Agent: Please be advised that Rx refills may take up to 3 business days. We ask that you follow-up with your pharmacy.

## 2024-09-28 NOTE — Telephone Encounter (Signed)
Great and thank you

## 2024-09-29 MED ORDER — TRAMADOL HCL 50 MG PO TABS: 50.0000 mg | ORAL_TABLET | Freq: Two times a day (BID) | ORAL | 0 refills | Status: DC | PRN

## 2024-10-07 LAB — TRAMADOL + METABOLITES, URINE
O-Desmethyltramadol, Ur: 810 ng/mL
Tramadol Ur: 6556 ng/mL

## 2024-10-07 LAB — SPECIMEN STATUS REPORT

## 2024-10-10 ENCOUNTER — Other Ambulatory Visit: Payer: Self-pay

## 2024-10-10 MED ORDER — OLMESARTAN MEDOXOMIL 40 MG PO TABS: 40.0000 mg | ORAL_TABLET | Freq: Every day | ORAL | 0 refills | Status: DC

## 2024-10-26 ENCOUNTER — Telehealth: Payer: Self-pay | Admitting: Family

## 2024-10-26 ENCOUNTER — Encounter: Payer: Self-pay | Admitting: Family

## 2024-10-26 ENCOUNTER — Encounter

## 2024-10-26 ENCOUNTER — Ambulatory Visit: Admitting: Family

## 2024-10-26 VITALS — BP 160/78 | HR 76 | Temp 98.0°F | Ht 66.5 in | Wt 178.8 lb

## 2024-10-26 DIAGNOSIS — M797 Fibromyalgia: Secondary | ICD-10-CM

## 2024-10-26 DIAGNOSIS — N184 Chronic kidney disease, stage 4 (severe): Secondary | ICD-10-CM

## 2024-10-26 DIAGNOSIS — D508 Other iron deficiency anemias: Secondary | ICD-10-CM

## 2024-10-26 DIAGNOSIS — R739 Hyperglycemia, unspecified: Secondary | ICD-10-CM

## 2024-10-26 DIAGNOSIS — F5101 Primary insomnia: Secondary | ICD-10-CM | POA: Insufficient documentation

## 2024-10-26 DIAGNOSIS — I10 Essential (primary) hypertension: Secondary | ICD-10-CM

## 2024-10-26 DIAGNOSIS — R946 Abnormal results of thyroid function studies: Secondary | ICD-10-CM

## 2024-10-26 DIAGNOSIS — E782 Mixed hyperlipidemia: Secondary | ICD-10-CM

## 2024-10-26 DIAGNOSIS — L659 Nonscarring hair loss, unspecified: Secondary | ICD-10-CM

## 2024-10-26 DIAGNOSIS — Z780 Asymptomatic menopausal state: Secondary | ICD-10-CM

## 2024-10-26 DIAGNOSIS — F1120 Opioid dependence, uncomplicated: Secondary | ICD-10-CM

## 2024-10-26 DIAGNOSIS — D631 Anemia in chronic kidney disease: Secondary | ICD-10-CM

## 2024-10-26 LAB — CBC
HCT: 35 % — ABNORMAL LOW (ref 36.0–46.0)
Hemoglobin: 11.3 g/dL — ABNORMAL LOW (ref 12.0–15.0)
MCHC: 32.4 g/dL (ref 30.0–36.0)
MCV: 94.2 fl (ref 78.0–100.0)
Platelets: 311 10*3/uL (ref 150.0–400.0)
RBC: 3.71 Mil/uL — ABNORMAL LOW (ref 3.87–5.11)
RDW: 13.6 % (ref 11.5–15.5)
WBC: 7.2 10*3/uL (ref 4.0–10.5)

## 2024-10-26 LAB — LIPID PANEL
Cholesterol: 279 mg/dL — ABNORMAL HIGH (ref 28–200)
HDL: 88.2 mg/dL
LDL Cholesterol: 149 mg/dL — ABNORMAL HIGH (ref 10–99)
NonHDL: 190.56
Total CHOL/HDL Ratio: 3
Triglycerides: 206 mg/dL — ABNORMAL HIGH (ref 10.0–149.0)
VLDL: 41.2 mg/dL — ABNORMAL HIGH (ref 0.0–40.0)

## 2024-10-26 LAB — IBC + FERRITIN
Ferritin: 95.4 ng/mL (ref 10.0–291.0)
Iron: 83 ug/dL (ref 42–145)
Saturation Ratios: 22.6 % (ref 20.0–50.0)
TIBC: 366.8 ug/dL (ref 250.0–450.0)
Transferrin: 262 mg/dL (ref 212.0–360.0)

## 2024-10-26 LAB — HEMOGLOBIN A1C: Hgb A1c MFr Bld: 5.7 % (ref 4.6–6.5)

## 2024-10-26 LAB — B12 AND FOLATE PANEL
Folate: >23.4 ng/mL
Vitamin B-12: >1500 pg/mL — ABNORMAL HIGH (ref 211–911)

## 2024-10-26 LAB — T4, FREE: Free T4: 0.76 ng/dL (ref 0.60–1.60)

## 2024-10-26 LAB — TSH: TSH: 1.18 u[IU]/mL (ref 0.35–5.50)

## 2024-10-26 LAB — T3, FREE: T3, Free: 3.3 pg/mL (ref 2.3–4.2)

## 2024-10-26 MED ORDER — AMLODIPINE BESYLATE 10 MG PO TABS: 10.0000 mg | ORAL_TABLET | Freq: Every day | ORAL | 3 refills | Status: AC

## 2024-10-26 MED ORDER — TRAMADOL HCL 50 MG PO TABS: 50.0000 mg | ORAL_TABLET | Freq: Three times a day (TID) | ORAL | 2 refills | Status: AC | PRN

## 2024-10-26 MED ORDER — CHLORTHALIDONE 25 MG PO TABS: 25.0000 mg | ORAL_TABLET | Freq: Every day | ORAL | 3 refills | Status: AC

## 2024-10-26 MED ORDER — OLMESARTAN MEDOXOMIL 40 MG PO TABS: 40.0000 mg | ORAL_TABLET | Freq: Every day | ORAL | 3 refills | Status: AC

## 2024-10-26 NOTE — Progress Notes (Signed)
 "  Established Patient Office Visit  Subjective:      CC:  Chief Complaint  Patient presents with   Establish Care    HPI: Sheri [name] is a 74 y.o. female presenting on 10/26/2024 for Establish Care .  Discussed the use of AI scribe software for clinical note transcription with the patient, who gave verbal consent to proceed.  History of Present Illness Sheri [name] is a 74 year old female with chronic insomnia and fibromyalgia who presents with persistent sleep disturbances and pain management issues.  Chronic insomnia has been present since 1982, characterized by difficulty sleeping and waking up early. She has tried various treatments including melatonin and cyclobenzaprine , which were ineffective. She often wakes up automatically at 3 AM, sometimes after only an hour of sleep. This lack of sleep affects her daily functioning, although she continues to work full-time in a physically demanding job. Frequent nocturia further disrupts her sleep.  She experiences constant pain due to fibromyalgia, describing her body as being in 'constant pain' with sore skin and throbbing sensations. Tramadol  is taken three times a day to manage the pain, but she still experiences significant discomfort. 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  She has chronic kidney disease, attributed to long-standing hypertension.she is currently followed by a nephrologist. Her current medications for blood pressure include olmesartan , amlodipine , and chlorthalidone . She also takes iron supplements due to low iron levels, which have been attributed to her kidney condition. She drinks a lot of water during the day to stay hydrated, especially at work, and has tried Azo for bladder control but discontinued it due to concerns about kidney safety.  She underwent cataract surgery on October 05, 2024, and is currently on leave from work. She has had multiple eye surgeries due to a retinal hole that has not closed despite repeated procedures. Her vision is currently 20/20, but she experiences cloudiness and uses old glasses for reading. She is awaiting clearance to return to work after her next eye appointment.  She has a history of a total hysterectomy due to painful and heavy periods. She also reports a family history of kidney issues, as her sister has had stents placed for similar problems.  She has a history of hair thinning, which she attributes to past use of statins and metoprolol , both of which she discontinued due to side effects. She uses biotin and other over-the-counter products to manage this issue.         Social history:  Relevant past medical, surgical, family and social history reviewed and updated as indicated. Interim medical history since our last visit reviewed.  Allergies and medications reviewed and updated.  DATA REVIEWED: CHART IN EPIC     ROS: Negative unless specifically indicated above in HPI.   Current Medications[1]        Objective:        BP (!) 160/78   Pulse 76   Temp 98 F (36.7 C) (Temporal)   Ht 5' 6.5 (1.689 m)   Wt 178 lb 12.8 oz (81.1 kg)   SpO2 98%   BMI 28.43 kg/m   Physical Exam VITALS: BP- 160/78 MEASUREMENTS: Weight- 172. NECK: Thyroid  prominence.  Wt Readings from Last 3 Encounters:  10/26/24 178 lb 12.8 oz (81.1 kg)  04/25/24 173 lb (78.5 kg)  10/25/23 176 lb (79.8 kg)    Physical Exam Vitals reviewed.  Constitutional:      General: She is not in acute distress.    Appearance: Normal appearance. She is normal weight. She is not ill-appearing, toxic-appearing or diaphoretic.  HENT:     Head: Normocephalic.  Neck:     Thyroid : Thyromegaly present. No thyroid  tenderness.  Cardiovascular:     Rate and Rhythm: Normal rate and regular rhythm.  Pulmonary:     Effort: Pulmonary effort is normal.   Musculoskeletal:        General: Normal range of motion.  Neurological:     General: No focal deficit present.     Mental Status: She is alert and oriented to person, place, and time. Mental status is at baseline.  Psychiatric:        Mood and Affect: Mood normal.        Behavior: Behavior normal.        Thought Content: Thought content normal.        Judgment: Judgment normal.     Wt Readings from Last 3 Encounters:  10/26/24 178 lb 12.8 oz (81.1 kg)  04/25/24 173 lb (78.5 kg)  10/25/23 176 lb (79.8 kg)        Results Labs eGFR (01/2024): 26, consistent with stage 4 chronic kidney disease Potassium (08/23/2024): Elevated  Assessment & Plan:   Assessment and Plan Assessment & Plan Primary insomnia Chronic insomnia with difficulty maintaining sleep, likely exacerbated by fibromyalgia and chronic kidney disease. Reports waking up early and having difficulty returning to sleep. Lack of sleep may contribute to fibromyalgia symptoms. - Consulted pharmacist for sleep aids that do not interact with current medications or affect kidney function. - Consider CBD products for sleep, ensuring they are safe for kidney function. - Implement sleep hygiene practices, including establishing a consistent sleep routine, avoiding screens before bed, and creating a calming sleep environment.  Fibromyalgia Chronic pain with constant body aches and sensitivity to touch, consistent with fibromyalgia. Pain management is challenging due to kidney disease and potential medication interactions. - Continue tramadol  50 mg tid prn for pain management with caution to sedative effects, do not take with other sedating agents.  - Consulted pharmacist for potential additional pain management options that are safe for kidney function. - Consider pregabalin as a potential treatment option, pending pharmacist consultation. -pdmp reviewed, updated drug contract, UDS as expected 12/25 Chronic kidney disease,  stage 4 Stage 4 chronic kidney disease with a GFR of 26, likely secondary to long-standing hypertension. Risk of kidney failure in the next five years is less than 12%. - Continue current antihypertensive regimen including olmesartan , amlodipine , and chlorthalidone . - Monitor blood pressure at home twice daily for the next five days. - Consulted with nephrologist regarding potential use of CBD for sleep and pain management.  Essential hypertension Hypertension is well-managed with current medication regimen. Blood pressure readings are variable, likely influenced by sleep patterns and kidney function. - Continue current antihypertensive medications. - Monitor blood pressure at home twice daily for the next five days.  Iron deficiency anemia due to chronic kidney disease Iron deficiency anemia likely secondary to chronic kidney disease and reduced erythropoietin production. Reports low iron levels and uses iron gummies to supplement. - Continue iron supplementation with gummies. - Monitor iron levels regularly.  Abnormal thyroid  function Possible thyroid  enlargement noted on examination. Thyroid  function may contribute to weight gain and hair thinning. - Ordered thyroid  function tests to assess current thyroid  status.  Nonscarring hair loss Chronic hair thinning, possibly related to anemia and thyroid   function. Previous treatments with oils and minoxidil have been ineffective. - Referred to dermatology for further evaluation and management of hair loss.  General health maintenance Routine health maintenance is ongoing, including mammogram and bone density screening. - Scheduled bone density scan with upcoming mammogram. - Continue routine health maintenance screenings.        Return in about 3 months (around 01/23/2025) for f/u blood pressure, f/u opioids .     Ginger Patrick, MSN, APRN, FNP-C [facility]        [1]  Current Outpatient  Medications:    aspirin  81 MG tablet, Take 81 mg by mouth daily., Disp: , Rfl:    BIOTIN PO, Take by mouth., Disp: , Rfl:    Ferrous Sulfate (IRON PO), Take by mouth., Disp: , Rfl:    Multiple Vitamins-Minerals (ZINC PO), Take by mouth., Disp: , Rfl:    omeprazole  (PRILOSEC) 20 MG capsule, Take 1 capsule (20 mg total) by mouth daily., Disp: 90 capsule, Rfl: 1   OVER THE COUNTER MEDICATION, Super Be[employer] Heart Chews, Disp: , Rfl:    amLODipine  (NORVASC ) 10 MG tablet, Take 1 tablet (10 mg total) by mouth daily., Disp: 90 tablet, Rfl: 3   chlorthalidone  (HYGROTON ) 25 MG tablet, Take 1 tablet (25 mg total) by mouth daily., Disp: 90 tablet, Rfl: 3   olmesartan  (BENICAR ) 40 MG tablet, Take 1 tablet (40 mg total) by mouth daily., Disp: 90 tablet, Rfl: 3   traMADol  (ULTRAM ) 50 MG tablet, Take 1 tablet (50 mg total) by mouth 3 (three) times daily as needed., Disp: 90 tablet, Rfl: 2  "

## 2024-10-26 NOTE — Patient Instructions (Signed)
 Sheri [name]

## 2024-10-26 NOTE — Telephone Encounter (Signed)
 Would you mind weighing in?      Tramadol  is not recommended for older adults, especially with renal impairment, due to risks of accumulation and adverse effects.[1-4]  Acetaminophen can be used for pain management, starting at [phone] mg orally 2-3 times daily.[3][5]  Low-dose opioids like oxycodone  (2.5-5 mg orally) or hydromorphone  (1-2 mg orally) may be considered, with careful monitoring.[2][5]  Duloxetine, milnacipran, and pregabalin are FDA-approved for fibromyalgia and can help with pain and sleep

## 2024-10-26 NOTE — Telephone Encounter (Signed)
 I am sorry I sent this prior to asking you the actual question.  Pt with insomnia persistent as well as chronic pain with fibromyalgia that is not controlled.  She is on tramadol  50 mg tid prn which she typically takes bid / tid regularly.  Cymbalta doesn't look like an option because of CKD GFR in the 20's. Same with gabapentin.   Could we consider pregablin?  And or another option that may help with insomnia and fibro?   Milnacipran ? (I am not as familiar with this)

## 2024-11-15 ENCOUNTER — Ambulatory Visit

## 2025-01-23 ENCOUNTER — Ambulatory Visit: Admitting: Family
